# Patient Record
Sex: Female | Born: 1990 | Race: White | Hispanic: No | Marital: Married | Smoking: Former smoker
Health system: Southern US, Community
[De-identification: ages and names within clinical notes are randomized; demographics above are authoritative.]

## PROBLEM LIST (undated history)

## (undated) ENCOUNTER — Inpatient Hospital Stay (HOSPITAL_COMMUNITY): Payer: Self-pay

## (undated) DIAGNOSIS — C8190 Hodgkin lymphoma, unspecified, unspecified site: Secondary | ICD-10-CM

## (undated) DIAGNOSIS — R0600 Dyspnea, unspecified: Secondary | ICD-10-CM

## (undated) DIAGNOSIS — D709 Neutropenia, unspecified: Secondary | ICD-10-CM

## (undated) DIAGNOSIS — R5081 Fever presenting with conditions classified elsewhere: Secondary | ICD-10-CM

## (undated) DIAGNOSIS — N390 Urinary tract infection, site not specified: Secondary | ICD-10-CM

## (undated) DIAGNOSIS — F419 Anxiety disorder, unspecified: Secondary | ICD-10-CM

## (undated) DIAGNOSIS — F329 Major depressive disorder, single episode, unspecified: Secondary | ICD-10-CM

## (undated) HISTORY — PX: MOUTH SURGERY: SHX715

## (undated) HISTORY — DX: Fever presenting with conditions classified elsewhere: R50.81

## (undated) HISTORY — DX: Neutropenia, unspecified: D70.9

## (undated) HISTORY — PX: WRIST SURGERY: SHX841

## (undated) HISTORY — PX: INDUCED ABORTION: SHX677

---
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
## 2014-06-30 ENCOUNTER — Encounter (HOSPITAL_COMMUNITY): Payer: Self-pay

## 2014-06-30 ENCOUNTER — Emergency Department (HOSPITAL_COMMUNITY)
Admission: EM | Admit: 2014-06-30 | Discharge: 2014-06-30 | Disposition: A | Discharge status: Home / Self Care | Payer: 59 | Attending: Emergency Medicine | Admitting: Emergency Medicine

## 2014-06-30 DIAGNOSIS — Z79899 Other long term (current) drug therapy: Secondary | ICD-10-CM | POA: Insufficient documentation

## 2014-06-30 DIAGNOSIS — N390 Urinary tract infection, site not specified: Secondary | ICD-10-CM | POA: Insufficient documentation

## 2014-06-30 DIAGNOSIS — Z87828 Personal history of other (healed) physical injury and trauma: Secondary | ICD-10-CM | POA: Insufficient documentation

## 2014-06-30 DIAGNOSIS — Z3202 Encounter for pregnancy test, result negative: Secondary | ICD-10-CM | POA: Diagnosis not present

## 2014-06-30 DIAGNOSIS — R109 Unspecified abdominal pain: Secondary | ICD-10-CM | POA: Diagnosis present

## 2014-06-30 LAB — URINALYSIS, ROUTINE W REFLEX MICROSCOPIC
BILIRUBIN URINE: NEGATIVE
Glucose, UA: NEGATIVE mg/dL
Hgb urine dipstick: NEGATIVE
KETONES UR: NEGATIVE mg/dL
NITRITE: NEGATIVE
Protein, ur: NEGATIVE mg/dL
SPECIFIC GRAVITY, URINE: 1.023 (ref 1.005–1.030)
UROBILINOGEN UA: 0.2 mg/dL (ref 0.0–1.0)
pH: 6.5 (ref 5.0–8.0)

## 2014-06-30 LAB — COMPREHENSIVE METABOLIC PANEL
ALBUMIN: 4 g/dL (ref 3.5–5.2)
ALT: 15 U/L (ref 0–35)
ANION GAP: 14 (ref 5–15)
AST: 17 U/L (ref 0–37)
Alkaline Phosphatase: 67 U/L (ref 39–117)
BILIRUBIN TOTAL: 0.4 mg/dL (ref 0.3–1.2)
BUN: 13 mg/dL (ref 6–23)
CHLORIDE: 104 meq/L (ref 96–112)
CO2: 22 mEq/L (ref 19–32)
CREATININE: 0.72 mg/dL (ref 0.50–1.10)
Calcium: 9.6 mg/dL (ref 8.4–10.5)
GFR calc Af Amer: >90 mL/min (ref >90)
GFR calc non Af Amer: >90 mL/min (ref >90)
Glucose, Bld: 83 mg/dL (ref 70–99)
Potassium: 4.1 mEq/L (ref 3.7–5.3)
Sodium: 140 mEq/L (ref 137–147)
TOTAL PROTEIN: 7.6 g/dL (ref 6.0–8.3)

## 2014-06-30 LAB — CBC WITH DIFFERENTIAL/PLATELET
BASOS PCT: 0 % (ref 0–1)
Basophils Absolute: 0 10*3/uL (ref 0.0–0.1)
EOS ABS: 0.4 10*3/uL (ref 0.0–0.7)
EOS PCT: 4 % (ref 0–5)
HEMATOCRIT: 38.9 % (ref 36.0–46.0)
HEMOGLOBIN: 13.2 g/dL (ref 12.0–15.0)
Lymphocytes Relative: 32 % (ref 12–46)
Lymphs Abs: 3.2 10*3/uL (ref 0.7–4.0)
MCH: 29.8 pg (ref 26.0–34.0)
MCHC: 33.9 g/dL (ref 30.0–36.0)
MCV: 87.8 fL (ref 78.0–100.0)
MONO ABS: 1 10*3/uL (ref 0.1–1.0)
MONOS PCT: 10 % (ref 3–12)
Neutro Abs: 5.4 10*3/uL (ref 1.7–7.7)
Neutrophils Relative %: 54 % (ref 43–77)
Platelets: 282 10*3/uL (ref 150–400)
RBC: 4.43 MIL/uL (ref 3.87–5.11)
RDW: 12.2 % (ref 11.5–15.5)
WBC: 10 10*3/uL (ref 4.0–10.5)

## 2014-06-30 LAB — URINE MICROSCOPIC-ADD ON

## 2014-06-30 LAB — PREGNANCY, URINE: PREG TEST UR: NEGATIVE

## 2014-06-30 MED ORDER — DEXTROSE 5 % IV SOLN
1.0000 g | Freq: Once | INTRAVENOUS | Status: AC
  Administered 2014-06-30: 1 g via INTRAVENOUS
  Filled 2014-06-30: qty 10

## 2014-06-30 MED ORDER — CIPROFLOXACIN HCL 500 MG PO TABS: 500.0000 mg | ORAL_TABLET | Freq: Two times a day (BID) | ORAL | Status: DC

## 2014-06-30 MED ORDER — SODIUM CHLORIDE 0.9 % IV BOLUS (SEPSIS)
1000.0000 mL | Freq: Once | INTRAVENOUS | Status: AC
  Administered 2014-06-30: 1000 mL via INTRAVENOUS

## 2014-06-30 NOTE — Discharge Instructions (Signed)
Take cipro twice a day for a week.  Stay hydrated.   Take tylenol, motrin for pain.   Follow up with your doctor.   Return to ER if you have severe pain, fever, vomiting.

## 2014-06-30 NOTE — ED Provider Notes (Signed)
CSN: [id number]     Arrival date & time 06/30/14  8416 History  This chart was scribed for Wandra Arthurs, MD by Rayfield Citizen, ED Scribe. This patient was seen in room D33C/D33C and the patient's care was started at 1:14 AM.    Chief Complaint  Patient presents with  . Abdominal Pain   The history is provided by the patient. No language interpreter was used.     HPI Comments: Adrienne [name] is a 23 y.o. female who presents to the Emergency Department complaining of 3 days of  intermittent abdominal pain and nausea. She notes that the abdominal pain woke her from sleep tonight at 2330. She also notes "tissue" in her urine. She denies vomiting, diarrhea, dysuria, vaginal discharge.   She has one prior experience with UTI (2013); no other significant history of this issue. She denies any other relevant medical history. She does note that she is a recovering drug addict and would prefer no narcotic pain meds.   LNMP two weeks PTA.   History reviewed. No pertinent past medical history. Past Surgical History  Procedure Laterality Date  . Wrist surgery    . Mouth surgery     History reviewed. No pertinent family history. History  Substance Use Topics  . Smoking status: Never Smoker   . Smokeless tobacco: Not on file  . Alcohol Use: No   OB History    No data available     Review of Systems  Gastrointestinal: Positive for nausea and abdominal pain.  All other systems reviewed and are negative.   Allergies  Review of patient's allergies indicates no known allergies.  Home Medications   Prior to Admission medications   Medication Sig Start Date End Date Taking? Authorizing Provider  BIOTIN PO Take 1 tablet by mouth daily.   Yes Historical Provider, MD  Prenatal Vit-Fe Fumarate-FA (PRENATAL MULTIVITAMIN) TABS tablet Take 1 tablet by mouth daily at 12 noon.   Yes Historical Provider, MD   BP 98/53 mmHg  Pulse 69  Temp(Src) 97.5 F (36.4 C) (Oral)  Resp 14  Ht 5\' 9"  (1.753 m)   Wt 175 lb (79.379 kg)  BMI 25.83 kg/m2  SpO2 98%  LMP 06/09/2014 Physical Exam  Constitutional: She is oriented to person, place, and time. She appears well-developed and well-nourished.  HENT:  Head: Normocephalic and atraumatic.  Neck: No tracheal deviation present.  Cardiovascular: Normal rate.   Pulmonary/Chest: Effort normal. She has no wheezes. She has no rales.  Genitourinary:  No CVA tenderness  Neurological: She is alert and oriented to person, place, and time.  Skin: Skin is warm and dry.  Psychiatric: She has a normal mood and affect. Her behavior is normal.  Nursing note and vitals reviewed.   ED Course  Procedures   DIAGNOSTIC STUDIES: Oxygen Saturation is 100RA% on RA, normal by my interpretation.    COORDINATION OF CARE: 1:15 AM Discussed treatment plan with pt at bedside and pt agreed to plan.  Labs Review Labs Reviewed  URINALYSIS, ROUTINE W REFLEX MICROSCOPIC - Abnormal; Notable for the following:    APPearance CLOUDY (*)    Leukocytes, UA MODERATE (*)    All other components within normal limits  URINE MICROSCOPIC-ADD ON - Abnormal; Notable for the following:    Squamous Epithelial / LPF MANY (*)    Bacteria, UA MANY (*)    All other components within normal limits  URINE CULTURE  CBC WITH DIFFERENTIAL  COMPREHENSIVE METABOLIC PANEL  PREGNANCY, URINE  Imaging Review No results found.   EKG Interpretation None      MDM   Final diagnoses:  None   Adrienne [name] is a 23 y.o. female here with suprapubic pain. Likely UTI. UA + UTI. Of note, BP normal on arrival, dropped to 74/56 in the ED, likely from cuff placement. Inc to 98/53 with adjustment of the cuff. Given 1L NS, ceftriaxone. WBC nl. Not septic appearing. No vaginal discharge and not concerned for GYN pathology. Will d/c home with cipro.   I personally performed the services described in this documentation, which was scribed in my presence. The recorded information has been  reviewed and is accurate.    Wandra Arthurs, MD 06/30/14 [phone]

## 2014-06-30 NOTE — ED Notes (Signed)
Pt presents with suprapubic pain and nausea starting Friday. Pt states she feels like she is urinating "bloody tissue," pt denies abnormal vaginal odor, discharge, bleeding or burning with urination, vomiting or fevers.

## 2014-07-01 ENCOUNTER — Ambulatory Visit (INDEPENDENT_AMBULATORY_CARE_PROVIDER_SITE_OTHER): Payer: 59 | Admitting: Physician Assistant

## 2014-07-01 VITALS — BP 116/70 | HR 81 | Temp 97.9°F | Resp 16 | Ht 68.5 in | Wt 189.4 lb

## 2014-07-01 DIAGNOSIS — N390 Urinary tract infection, site not specified: Secondary | ICD-10-CM

## 2014-07-01 DIAGNOSIS — R1030 Lower abdominal pain, unspecified: Secondary | ICD-10-CM

## 2014-07-01 DIAGNOSIS — R112 Nausea with vomiting, unspecified: Secondary | ICD-10-CM

## 2014-07-01 DIAGNOSIS — R300 Dysuria: Secondary | ICD-10-CM

## 2014-07-01 LAB — POCT URINALYSIS DIPSTICK
GLUCOSE UA: 100
NITRITE UA: POSITIVE
Protein, UA: 100
Spec Grav, UA: >=1.03
UROBILINOGEN UA: 1
pH, UA: 5

## 2014-07-01 LAB — POCT WET PREP WITH KOH
CLUE CELLS WET PREP PER HPF POC: NEGATIVE
KOH PREP POC: NEGATIVE
RBC Wet Prep HPF POC: NEGATIVE
TRICHOMONAS UA: NEGATIVE
Yeast Wet Prep HPF POC: NEGATIVE

## 2014-07-01 LAB — URINE CULTURE

## 2014-07-01 LAB — POCT UA - MICROSCOPIC ONLY
CASTS, UR, LPF, POC: NEGATIVE
Crystals, Ur, HPF, POC: NEGATIVE
Mucus, UA: NEGATIVE
YEAST UA: NEGATIVE

## 2014-07-01 MED ORDER — NITROFURANTOIN MONOHYD MACRO 100 MG PO CAPS
100.0000 mg | ORAL_CAPSULE | Freq: Two times a day (BID) | ORAL | Status: DC
Start: 2014-07-01 — End: 2014-07-01

## 2014-07-01 MED ORDER — NITROFURANTOIN MONOHYD MACRO 100 MG PO CAPS
100.0000 mg | ORAL_CAPSULE | Freq: Two times a day (BID) | ORAL | Status: DC
Start: 2014-07-01 — End: 2015-04-02

## 2014-07-01 NOTE — Patient Instructions (Signed)
Stop taking cipro. Start taking new antibiotic macrobid - you will take this for 7 days. If not improving in 48 hours, call the office. Drink plenty of water - 64 oz/day.

## 2014-07-01 NOTE — Progress Notes (Signed)
Subjective:    Patient ID: Adrienne [name], female    DOB: 08-Jun-1991, 23 y.o.   MRN: [id number] There are no active problems to display for this patient.  Prior to Admission medications   Medication Sig Start Date End Date Taking? Authorizing Provider  BIOTIN PO Take 1 tablet by mouth daily.   Yes Historical Provider, MD  ciprofloxacin (CIPRO) 500 MG tablet Take 1 tablet (500 mg total) by mouth 2 (two) times daily. One po bid x 7 days 06/30/14  Yes Wandra Arthurs, MD  Prenatal Vit-Fe Fumarate-FA (PRENATAL MULTIVITAMIN) TABS tablet Take 1 tablet by mouth daily at 12 noon.   Yes Historical Provider, MD   No Known Allergies  HPI  This is a 23 year old female presenting with intermittent sharp abdominal pain, dysuria, urinary frequency and nausea x 5 days. She was seen in the ED early morning of 11/23 and was diagnosed with UTI and sent home with cipro 500 mg. CBC, CMP and urine pregnancy negative at that time. Urine culture did not have any significant growth. She reports she took 2 cipros yesterday and 2 cipros today and her symptoms are worsening. She states she vomited twice last night. She has been using AZO for urinary pain and ibuprofen for abdominal pain without relief. She notes when she urinates she is "peeing out tissue". She denies fever, chills, back pain, hematuria, or vaginal discharge.  Patient's social and family history were reviewed.   Review of Systems  Constitutional: Negative for fever and chills.  Gastrointestinal: Positive for abdominal pain. Negative for nausea, vomiting and diarrhea.  Genitourinary: Positive for dysuria and frequency. Negative for hematuria and vaginal discharge.  Musculoskeletal: Negative for back pain.  Skin: Negative for rash.      Objective:   Physical Exam  Constitutional: She is oriented to person, place, and time. She appears well-developed and well-nourished. No distress.  HENT:  Head: Normocephalic and atraumatic.  Right Ear: Hearing  normal.  Left Ear: Hearing normal.  Nose: Nose normal.  Mouth/Throat: Uvula is midline, oropharynx is clear and moist and mucous membranes are normal.  Eyes: Conjunctivae and lids are normal. Right eye exhibits no discharge. Left eye exhibits no discharge. No scleral icterus.  Cardiovascular: Normal rate, regular rhythm, normal heart sounds, intact distal pulses and normal pulses.   No murmur heard. Pulmonary/Chest: Effort normal and breath sounds normal. No respiratory distress. She has no wheezes. She has no rhonchi. She has no rales.  Abdominal: Soft. Normal appearance and bowel sounds are normal. There is tenderness in the suprapubic area. There is no rebound, no guarding and no CVA tenderness.  Genitourinary: Vagina normal and uterus normal. There is no lesion on the right labia. There is no lesion on the left labia. Cervix exhibits no motion tenderness, no discharge and no friability. Right adnexum displays no mass, no tenderness and no fullness. Left adnexum displays no mass, no tenderness and no fullness. No tenderness in the vagina. No vaginal discharge found.  Musculoskeletal: Normal range of motion.  Lymphadenopathy:       Head (right side): No submental, no submandibular, no tonsillar, no preauricular, no posterior auricular and no occipital adenopathy present.       Head (left side): No submental, no submandibular, no tonsillar, no preauricular, no posterior auricular and no occipital adenopathy present.    She has no cervical adenopathy.  Neurological: She is alert and oriented to person, place, and time.  Skin: Skin is warm, dry and intact.  No lesion and no rash noted.  Psychiatric: She has a normal mood and affect. Her speech is normal and behavior is normal. Thought content normal.   Results for orders placed or performed in visit on 07/01/14  POCT UA - Microscopic Only  Result Value Ref Range   WBC, Ur, HPF, POC tntc    RBC, urine, microscopic tntc    Bacteria, U Microscopic  4+    Mucus, UA neg    Epithelial cells, urine per micros 6-10    Crystals, Ur, HPF, POC neg    Casts, Ur, LPF, POC neg    Yeast, UA neg   POCT urinalysis dipstick  Result Value Ref Range   Color, UA orange    Clarity, UA turbid    Glucose, UA 100    Bilirubin, UA small    Ketones, UA trace    Spec Grav, UA >=1.030    Blood, UA moderate    pH, UA 5.0    Protein, UA 100    Urobilinogen, UA 1.0    Nitrite, UA positive    Leukocytes, UA large (3+)   POCT Wet Prep with KOH  Result Value Ref Range   Trichomonas, UA Negative    Clue Cells Wet Prep HPF POC neg    Epithelial Wet Prep HPF POC 4-8    Yeast Wet Prep HPF POC neg    Bacteria Wet Prep HPF POC trace    RBC Wet Prep HPF POC neg    WBC Wet Prep HPF POC 2-4    KOH Prep POC Negative        Assessment & Plan:  1. Dysuria 2. Abdominal pain 3. UTI (lower urinary tract infection) 4. Non-intractable vomiting with nausea, vomiting of unspecified type Patient likely has a UTI with bacteria resistant to cipro. Wet prep negative. UA + for UTI. Urine culture pending. She will discontinue cipro and start macrobid. She will return if no improvement in 24-48 hours.  - POCT UA - Microscopic Only - POCT urinalysis dipstick - Urine culture - POCT Wet Prep with KOH - GC/Chlamydia Probe Amp  Benjaman Pott. Drenda Freeze, [facility]  07/01/2014

## 2014-07-03 LAB — GC/CHLAMYDIA PROBE AMP
CT PROBE, AMP APTIMA: POSITIVE — AB
GC PROBE AMP APTIMA: NEGATIVE

## 2014-07-03 LAB — URINE CULTURE
Colony Count: NO GROWTH
ORGANISM ID, BACTERIA: NO GROWTH

## 2014-07-03 NOTE — Progress Notes (Signed)
The patient was discussed with me and I agree with the diagnosis and treatment plan.  

## 2014-07-04 ENCOUNTER — Telehealth: Payer: Self-pay | Admitting: Physician Assistant

## 2014-07-04 DIAGNOSIS — A749 Chlamydial infection, unspecified: Secondary | ICD-10-CM

## 2014-07-04 MED ORDER — AZITHROMYCIN 250 MG PO TABS: ORAL_TABLET | ORAL | Status: DC

## 2014-07-04 NOTE — Telephone Encounter (Signed)
Spoke with patient and told her she has chlamydia. Counseled on safe sex practices and the need to inform all sexual partners. Medication sent to pharmacy. She reports she is feeling better on macrobid for UTI but still having some abdominal pain. She will RTC in 5 days if not improved.

## 2014-08-08 DIAGNOSIS — F32A Depression, unspecified: Secondary | ICD-10-CM

## 2014-08-08 HISTORY — DX: Depression, unspecified: F32.A

## 2014-08-08 NOTE — L&D Delivery Note (Signed)
Delivery Note Patient pushed well for 40 minutes.  At 11:09 PM a viable female was delivered via Vaginal, Spontaneous Delivery (Presentation: Left Occiput Anterior).  APGAR: 9, 9; weight 8 lb 11 oz (3940 g).   Placenta status: Intact, Spontaneous.  Cord: 3 vessels with the following complications: None.  Cord pH: n/a  Anesthesia: Epidural  Episiotomy: None Lacerations: right vaginal, bilateral labial (right labial lac repaired, left labial lac was hemostatic and not repaired) Suture Repair: 2.0 3.0 vicryl rapide Est. Blood Loss (mL):  300 mL  Mom to postpartum.  Baby to Couplet care / Skin to Skin.  [place] 04/27/2015, 11:39 PM

## 2014-12-01 LAB — OB RESULTS CONSOLE ABO/RH: RH TYPE: POSITIVE

## 2014-12-01 LAB — OB RESULTS CONSOLE HEPATITIS B SURFACE ANTIGEN: Hepatitis B Surface Ag: NEGATIVE

## 2014-12-01 LAB — OB RESULTS CONSOLE HIV ANTIBODY (ROUTINE TESTING): HIV: NONREACTIVE

## 2014-12-01 LAB — OB RESULTS CONSOLE RPR: RPR: NONREACTIVE

## 2014-12-01 LAB — OB RESULTS CONSOLE RUBELLA ANTIBODY, IGM: Rubella: IMMUNE

## 2015-03-20 LAB — OB RESULTS CONSOLE GC/CHLAMYDIA
Chlamydia: NEGATIVE
GC PROBE AMP, GENITAL: NEGATIVE

## 2015-03-20 LAB — OB RESULTS CONSOLE GBS: GBS: POSITIVE

## 2015-04-02 ENCOUNTER — Inpatient Hospital Stay (HOSPITAL_COMMUNITY)
Admission: AD | Admit: 2015-04-02 | Discharge: 2015-04-02 | Disposition: A | Discharge status: Home / Self Care | Payer: Medicaid Other | Source: Ambulatory Visit | Attending: Obstetrics | Admitting: Obstetrics

## 2015-04-02 ENCOUNTER — Encounter (HOSPITAL_COMMUNITY): Payer: Self-pay | Admitting: *Deleted

## 2015-04-02 DIAGNOSIS — O133 Gestational [pregnancy-induced] hypertension without significant proteinuria, third trimester: Secondary | ICD-10-CM

## 2015-04-02 DIAGNOSIS — R030 Elevated blood-pressure reading, without diagnosis of hypertension: Secondary | ICD-10-CM | POA: Diagnosis present

## 2015-04-02 DIAGNOSIS — H538 Other visual disturbances: Secondary | ICD-10-CM | POA: Diagnosis present

## 2015-04-02 DIAGNOSIS — R51 Headache: Secondary | ICD-10-CM | POA: Diagnosis present

## 2015-04-02 DIAGNOSIS — Z3A37 37 weeks gestation of pregnancy: Secondary | ICD-10-CM | POA: Diagnosis not present

## 2015-04-02 DIAGNOSIS — O163 Unspecified maternal hypertension, third trimester: Secondary | ICD-10-CM

## 2015-04-02 LAB — URINALYSIS, ROUTINE W REFLEX MICROSCOPIC
BILIRUBIN URINE: NEGATIVE
Glucose, UA: NEGATIVE mg/dL
Hgb urine dipstick: NEGATIVE
KETONES UR: NEGATIVE mg/dL
Leukocytes, UA: NEGATIVE
NITRITE: NEGATIVE
Protein, ur: NEGATIVE mg/dL
Specific Gravity, Urine: <1.005 — ABNORMAL LOW (ref 1.005–1.030)
UROBILINOGEN UA: 0.2 mg/dL (ref 0.0–1.0)
pH: 6 (ref 5.0–8.0)

## 2015-04-02 LAB — CBC WITH DIFFERENTIAL/PLATELET
BASOS PCT: 0 % (ref 0–1)
Basophils Absolute: 0 10*3/uL (ref 0.0–0.1)
Eosinophils Absolute: 0.4 10*3/uL (ref 0.0–0.7)
Eosinophils Relative: 3 % (ref 0–5)
HEMATOCRIT: 35.3 % — AB (ref 36.0–46.0)
Hemoglobin: 11.8 g/dL — ABNORMAL LOW (ref 12.0–15.0)
Lymphocytes Relative: 14 % (ref 12–46)
Lymphs Abs: 2 10*3/uL (ref 0.7–4.0)
MCH: 29.5 pg (ref 26.0–34.0)
MCHC: 33.4 g/dL (ref 30.0–36.0)
MCV: 88.3 fL (ref 78.0–100.0)
MONO ABS: 1.2 10*3/uL — AB (ref 0.1–1.0)
MONOS PCT: 8 % (ref 3–12)
NEUTROS ABS: 10.5 10*3/uL — AB (ref 1.7–7.7)
Neutrophils Relative %: 75 % (ref 43–77)
Platelets: 257 10*3/uL (ref 150–400)
RBC: 4 MIL/uL (ref 3.87–5.11)
RDW: 12.7 % (ref 11.5–15.5)
WBC: 14 10*3/uL — ABNORMAL HIGH (ref 4.0–10.5)

## 2015-04-02 LAB — COMPREHENSIVE METABOLIC PANEL
ALBUMIN: 2.9 g/dL — AB (ref 3.5–5.0)
ALT: 19 U/L (ref 14–54)
ANION GAP: 8 (ref 5–15)
AST: 21 U/L (ref 15–41)
Alkaline Phosphatase: 130 U/L — ABNORMAL HIGH (ref 38–126)
BILIRUBIN TOTAL: 0.5 mg/dL (ref 0.3–1.2)
BUN: 10 mg/dL (ref 6–20)
CO2: 22 mmol/L (ref 22–32)
Calcium: 9.3 mg/dL (ref 8.9–10.3)
Chloride: 106 mmol/L (ref 101–111)
Creatinine, Ser: 0.52 mg/dL (ref 0.44–1.00)
GLUCOSE: 107 mg/dL — AB (ref 65–99)
POTASSIUM: 3.8 mmol/L (ref 3.5–5.1)
Sodium: 136 mmol/L (ref 135–145)
TOTAL PROTEIN: 6.6 g/dL (ref 6.5–8.1)

## 2015-04-02 LAB — PROTEIN / CREATININE RATIO, URINE: CREATININE, URINE: 48 mg/dL

## 2015-04-02 LAB — URIC ACID: URIC ACID, SERUM: 4.1 mg/dL (ref 2.3–6.6)

## 2015-04-02 LAB — LACTATE DEHYDROGENASE: LDH: 140 U/L (ref 98–192)

## 2015-04-02 NOTE — MAU Note (Signed)
Sent from [place] office for [place] eval;

## 2015-04-02 NOTE — MAU Note (Signed)
Patient was seen at Dr. Tyler Aas office blood pressure elevated, here for [facility] evaluation, has headache, blurred vision and some nausea.

## 2015-04-02 NOTE — Discharge Instructions (Signed)
Pain Relief During Labor and Delivery Everyone experiences pain differently, but labor causes severe pain for many women. The amount of pain you experience during labor and delivery depends on your pain tolerance, contraction strength, and your baby's size and position. There are many ways to prepare for and deal with the pain, including:   Taking prenatal classes to learn about labor and delivery. The more informed you are, the less anxious and afraid you may be. This can help lessen the pain.  Taking pain-relieving medicine during labor and delivery.  Learning breathing and relaxation techniques.  Taking a shower or bath.  Getting massaged.  Changing positions.  Placing an ice pack on your back. Discuss your pain control options with your health care provider during your prenatal visits.  WHAT ARE THE TWO TYPES OF PAIN-RELIEVING MEDICINES? 1. Analgesics. These are medicines that decrease pain without total loss of feeling or muscle movement. 2. Anesthetics. These are medicines that block all feeling, including pain. There can be minor side effects of both types, such as nausea, trouble concentrating, becoming sleepy, and lowering the heart rate of the baby. However, health care providers are careful to give doses that will not seriously affect the baby.  WHAT ARE THE SPECIFIC TYPES OF ANALGESICS AND ANESTHETICS? Systemic Analgesic Systemic pain medicines affect your whole body rather than focusing pain relief on the area of your body experiencing pain. This type of medicine is given either through an IV tube in your vein or by a shot (injection) into your muscle. This medicine will lessen your pain but will not stop it completely. It may also make you sleepy, but it will not make you lose consciousness.  Local Anesthetic Local anesthetic isused tonumb a small area of your body. The medicine is injected into the area of nerves that carry feeling to the vagina, vulva, or the area between  the vagina and anus (perineum).  General Anesthetic This type of medicine causes you to lose consciousness so you do not feel pain. It is usually used only in emergency situations during labor. It is given through an IV tube or face mask. Paracervical Block A paracervical block is a form of local anesthesia given during labor. Numbing medicine is injected into the right and left sides of the cervix and vagina. It helps to lessen the pain caused by contractions and stretching of the cervix. It may have to be given more than once.  Pudendal Block A pudendal block is another form of local anesthesia. It is used to relieve the pain associated with pushing or stretching of the perineum at the time of delivery. An injection is given deep through the vaginal wall into the pudendal nerve in the pelvis, numbing the perineum.  Epidural Anesthetic An epidural is an injection of numbing medicine given in the lower back and into the epidural space near your spinal cord. The epidural numbs the lower half of your body. You may be able to move your legs but will not be allowed to walk. Epidurals can be used for labor, delivery, or cesarean deliveries.  To prevent the medicine from wearing off, a small tube (catheter) may be threaded into the epidural space and taped in place to prevent it from slipping out. Medicine can then be given continuously in small doses through the tube until you deliver. Spinal Block A spinal block is similar to an epidural, but the medicine is injected into the spinal fluid, not the epidural space. A spinal block is only given  once. It starts to relieve pain quickly but lasts only 1-2 hours. Spinal blocks can also be used for cesarean deliveries.  Combined Spinal-Epidural Block Combined spinal-epidural blocks combine the benefits of both the spinal and epidural blocks. The spinal part acts quickly to relieve pain and the epidural provides continuous pain relief. Hydrotherapy Immersion in  warm water during labor may provide comfort and relaxation. It may also help to lessen pain, the use of anesthesia, and the length of labor. However, immersion in water during the delivery (water birth) may have some risk involved and studies to determine safety and risks are ongoing. If you are a healthy woman who is expecting an uncomplicated birth, talk with your health care provider to see if water birth is an option for you.  Document Released: 11/10/2008 Document Revised: 07/30/2013 Document Reviewed: 12/13/2012 [facility] Patient Information 2015 [place], [place]. This information is not intended to replace advice given to you by your health care provider. Make sure you discuss any questions you have with your health care provider.

## 2015-04-02 NOTE — MAU Provider Note (Signed)
History     CSN: [id number]  Arrival date and time: 04/02/15 1275   First Provider Initiated Contact with Patient 04/02/15 [phone]      Chief Complaint  Patient presents with  . Headache  . Blurred Vision  . Hypertension   HPI Adrienne [name] 24 y.o. G1P0 @[redacted]w[redacted]d  presents to MAU for elevated blood pressure.  She sees white spots in her visual field - none now but present when seen in clinic this am.   She sometimes has hot flashes with headache and nausea but none now.  She denies abdominal pain.  She admits to swelling of feet.  She reports good fetal movement and denies LOF, vag bleeding, dysuria.  She does not report present contractions.   OB History    Gravida Para Term Preterm AB TAB SAB Ectopic Multiple Living   1               History reviewed. No pertinent past medical history.  Past Surgical History  Procedure Laterality Date  . Wrist surgery    . Mouth surgery      Family History  Problem Relation Age of Onset  . Alcohol abuse Neg Hx   . Arthritis Neg Hx   . Asthma Neg Hx   . Birth defects Neg Hx   . Cancer Neg Hx   . COPD Neg Hx   . Depression Neg Hx   . Diabetes Neg Hx   . Drug abuse Neg Hx   . Early death Neg Hx   . Hearing loss Neg Hx   . Heart disease Neg Hx   . Hyperlipidemia Neg Hx   . Hypertension Neg Hx   . Kidney disease Neg Hx   . Learning disabilities Neg Hx   . Mental illness Neg Hx   . Mental retardation Neg Hx   . Miscarriages / Stillbirths Neg Hx   . Stroke Neg Hx   . Vision loss Neg Hx   . Varicose Veins Neg Hx     Social History  Substance Use Topics  . Smoking status: Never Smoker   . Smokeless tobacco: None  . Alcohol Use: No    Allergies: No Known Allergies  Prescriptions prior to admission  Medication Sig Dispense Refill Last Dose  . azithromycin (ZITHROMAX) 250 MG tablet Take 1000 mg (4 pills) po once. 4 tablet 0   . BIOTIN PO Take 1 tablet by mouth daily.   Taking  . nitrofurantoin, macrocrystal-monohydrate,  (MACROBID) 100 MG capsule Take 1 capsule (100 mg total) by mouth 2 (two) times daily. 14 capsule 0   . Prenatal Vit-Fe Fumarate-FA (PRENATAL MULTIVITAMIN) TABS tablet Take 1 tablet by mouth daily at 12 noon.   Taking    ROS Pertinent ROS in HPI.  All other systems are negative.   Physical Exam   Blood pressure 130/79, pulse 112, temperature 97.4 F (36.3 C), temperature source Oral, resp. rate 18, height 5\' 9"  (1.753 m), weight 235 lb 12.8 oz (106.958 kg), last menstrual period 06/09/2014.  Physical Exam  Constitutional: She is oriented to person, place, and time. She appears well-developed and well-nourished. No distress.  HENT:  Head: Normocephalic and atraumatic.  Eyes: EOM are normal.  Neck: Normal range of motion.  Cardiovascular: Normal rate.   No edema noted  Respiratory: Effort normal. No respiratory distress.  Musculoskeletal: Normal range of motion.  Neurological: She is alert and oriented to person, place, and time.  Skin: Skin is warm and dry.  Psychiatric:  She has a normal mood and affect.   Fetal Tracing: Baseline:130s Variability:mod Accelerations: 15x15s Decelerations:none Toco:none   MAU Course  Procedures  MDM PIH labs ordered to eval.   Results for orders placed or performed during the hospital encounter of 04/02/15 (from the past 24 hour(s))  Urinalysis, Routine w reflex microscopic (not at [facility])     Status: Abnormal   Collection Time: 04/02/15  9:15 AM  Result Value Ref Range   Color, Urine YELLOW YELLOW   APPearance CLEAR CLEAR   Specific Gravity, Urine <1.005 (L) 1.005 - 1.030   pH 6.0 5.0 - 8.0   Glucose, UA NEGATIVE NEGATIVE mg/dL   Hgb urine dipstick NEGATIVE NEGATIVE   Bilirubin Urine NEGATIVE NEGATIVE   Ketones, ur NEGATIVE NEGATIVE mg/dL   Protein, ur NEGATIVE NEGATIVE mg/dL   Urobilinogen, UA 0.2 0.0 - 1.0 mg/dL   Nitrite NEGATIVE NEGATIVE   Leukocytes, UA NEGATIVE NEGATIVE  Protein / creatinine ratio, urine     Status: None    Collection Time: 04/02/15  9:15 AM  Result Value Ref Range   Creatinine, Urine 48.00 mg/dL   Total Protein, Urine <6 mg/dL   Protein Creatinine Ratio        0.00 - 0.15 mg/mg[Cre]  CBC with Differential/Platelet     Status: Abnormal   Collection Time: 04/02/15  9:50 AM  Result Value Ref Range   WBC 14.0 (H) 4.0 - 10.5 K/uL   RBC 4.00 3.87 - 5.11 MIL/uL   Hemoglobin 11.8 (L) 12.0 - 15.0 g/dL   HCT 35.3 (L) 36.0 - 46.0 %   MCV 88.3 78.0 - 100.0 fL   MCH 29.5 26.0 - 34.0 pg   MCHC 33.4 30.0 - 36.0 g/dL   RDW 12.7 11.5 - 15.5 %   Platelets 257 150 - 400 K/uL   Neutrophils Relative % 75 43 - 77 %   Neutro Abs 10.5 (H) 1.7 - 7.7 K/uL   Lymphocytes Relative 14 12 - 46 %   Lymphs Abs 2.0 0.7 - 4.0 K/uL   Monocytes Relative 8 3 - 12 %   Monocytes Absolute 1.2 (H) 0.1 - 1.0 K/uL   Eosinophils Relative 3 0 - 5 %   Eosinophils Absolute 0.4 0.0 - 0.7 K/uL   Basophils Relative 0 0 - 1 %   Basophils Absolute 0.0 0.0 - 0.1 K/uL  Comprehensive metabolic panel     Status: Abnormal   Collection Time: 04/02/15  9:50 AM  Result Value Ref Range   Sodium 136 135 - 145 mmol/L   Potassium 3.8 3.5 - 5.1 mmol/L   Chloride 106 101 - 111 mmol/L   CO2 22 22 - 32 mmol/L   Glucose, Bld 107 (H) 65 - 99 mg/dL   BUN 10 6 - 20 mg/dL   Creatinine, Ser 0.52 0.44 - 1.00 mg/dL   Calcium 9.3 8.9 - 10.3 mg/dL   Total Protein 6.6 6.5 - 8.1 g/dL   Albumin 2.9 (L) 3.5 - 5.0 g/dL   AST 21 15 - 41 U/L   ALT 19 14 - 54 U/L   Alkaline Phosphatase 130 (H) 38 - 126 U/L   Total Bilirubin 0.5 0.3 - 1.2 mg/dL   GFR calc non Af Amer >60 >60 mL/min   GFR calc Af Amer >60 >60 mL/min   Anion gap 8 5 - 15  Uric acid     Status: None   Collection Time: 04/02/15  9:50 AM  Result Value Ref Range   Uric  Acid, Serum 4.1 2.3 - 6.6 mg/dL  Lactate dehydrogenase     Status: None   Collection Time: 04/02/15  9:50 AM  Result Value Ref Range   LDH 140 98 - 192 U/L   Discussed with Dr. Carlis Abbott whom is in agreement to discharge pt to  home.  If HA - treat.  If no resolution - call MD.  Keep appt for 8/30.  Assessment and Plan  A:  1. Elevated blood pressure affecting pregnancy in third trimester, antepartum    P: Discharge to home If HA - treat with Tylenol (OTC).  If no resolution - call office/MD.   Keep appt for [facility] 8/30.  Labor precautions discussed Patient may return to MAU as needed or if her condition were to change or worsen   Paticia Stack 04/02/2015, 9:32 AM

## 2015-04-18 ENCOUNTER — Encounter (HOSPITAL_COMMUNITY): Payer: Self-pay | Admitting: *Deleted

## 2015-04-18 ENCOUNTER — Inpatient Hospital Stay (HOSPITAL_COMMUNITY)
Admission: AD | Admit: 2015-04-18 | Discharge: 2015-04-18 | Disposition: A | Discharge status: Home / Self Care | Payer: Medicaid Other | Source: Ambulatory Visit | Attending: Obstetrics & Gynecology | Admitting: Obstetrics & Gynecology

## 2015-04-18 DIAGNOSIS — Z87891 Personal history of nicotine dependence: Secondary | ICD-10-CM | POA: Diagnosis not present

## 2015-04-18 DIAGNOSIS — J029 Acute pharyngitis, unspecified: Secondary | ICD-10-CM | POA: Diagnosis present

## 2015-04-18 DIAGNOSIS — J069 Acute upper respiratory infection, unspecified: Secondary | ICD-10-CM | POA: Diagnosis not present

## 2015-04-18 DIAGNOSIS — O9989 Other specified diseases and conditions complicating pregnancy, childbirth and the puerperium: Secondary | ICD-10-CM | POA: Diagnosis not present

## 2015-04-18 DIAGNOSIS — O26893 Other specified pregnancy related conditions, third trimester: Secondary | ICD-10-CM | POA: Diagnosis not present

## 2015-04-18 DIAGNOSIS — Z3A39 39 weeks gestation of pregnancy: Secondary | ICD-10-CM | POA: Insufficient documentation

## 2015-04-18 LAB — RAPID STREP SCREEN (MED CTR MEBANE ONLY): Streptococcus, Group A Screen (Direct): NEGATIVE

## 2015-04-18 MED ORDER — LORATADINE 10 MG PO TABS
10.0000 mg | ORAL_TABLET | Freq: Every day | ORAL | Status: DC
  Filled 2015-04-18: qty 1

## 2015-04-18 MED ORDER — ACETAMINOPHEN 500 MG PO TABS
1000.0000 mg | ORAL_TABLET | Freq: Once | ORAL | Status: AC
  Administered 2015-04-18: 1000 mg via ORAL
  Filled 2015-04-18: qty 2

## 2015-04-18 NOTE — MAU Note (Signed)
Sore throat started yesterday, some nasal congestion. No cough, unser of temperature.  Husband has had a cold.  Call dr 3 times last night, unsure what she could take, unable to sleep.  Also having some sharp pains in back and pain in lower abd/pelvis. ? Leaking for over a week, was checked in office, not ruptured

## 2015-04-18 NOTE — MAU Provider Note (Signed)
History     CSN: [id number]  Arrival date and time: 04/18/15 7829   None     Chief Complaint  Patient presents with  . Sore Throat   HPI   [name] is a 24 y.o. female G2P0010 at [redacted]w[redacted]d presenting to MAU with sore throat and congestion. Symptoms started 24-48 hours ago. She has not taken anything over the counter for the symptoms. She is here because she wants a list of medications she can take safely in pregnancy.   She denies fever.     OB History    Gravida Para Term Preterm AB TAB SAB Ectopic Multiple Living   2    1 1           Past Medical History  Diagnosis Date  . Bronchitis     gets it every year  . Infection     UTI  . Vaginal Pap smear, abnormal     durin first trimester    Past Surgical History  Procedure Laterality Date  . Wrist surgery    . Mouth surgery    . Induced abortion      Family History  Problem Relation Age of Onset  . Alcohol abuse Neg Hx   . Arthritis Neg Hx   . Asthma Neg Hx   . Birth defects Neg Hx   . COPD Neg Hx   . Depression Neg Hx   . Diabetes Neg Hx   . Drug abuse Neg Hx   . Early death Neg Hx   . Hearing loss Neg Hx   . Hyperlipidemia Neg Hx   . Hypertension Neg Hx   . Kidney disease Neg Hx   . Learning disabilities Neg Hx   . Mental illness Neg Hx   . Mental retardation Neg Hx   . Miscarriages / Stillbirths Neg Hx   . Stroke Neg Hx   . Vision loss Neg Hx   . Varicose Veins Neg Hx   . Cancer Paternal Grandmother     lung  . Heart disease Paternal Grandfather     died from heart attack    Social History  Substance Use Topics  . Smoking status: Former Research scientist (life sciences)  . Smokeless tobacco: Never Used     Comment: 2015  . Alcohol Use: No    Allergies: No Known Allergies  Prescriptions prior to admission  Medication Sig Dispense Refill Last Dose  . Prenatal Vit-Fe Fumarate-FA (PRENATAL MULTIVITAMIN) TABS tablet Take 1 tablet by mouth daily at 12 noon.   04/17/2015 at Unknown time   Results for orders  placed or performed during the hospital encounter of 04/18/15 (from the past 48 hour(s))  Rapid strep screen     Status: None   Collection Time: 04/18/15  8:35 AM  Result Value Ref Range   Streptococcus, Group A Screen (Direct) NEGATIVE NEGATIVE    Comment: (NOTE) A Rapid Antigen test may result negative if the antigen level in the sample is below the detection level of this test. The FDA has not cleared this test as a stand-alone test therefore the rapid antigen negative result has reflexed to a Group A Strep culture. Performed at [facility]     Review of Systems  Constitutional: Negative for fever and chills.  HENT: Positive for sore throat. Negative for ear pain.   Respiratory: Negative for cough.   Neurological: Negative for headaches.   Physical Exam   Blood pressure 117/77, pulse 96, temperature 98 F (36.7 C), temperature source  Oral, resp. rate 18, weight 109.408 kg (241 lb 3.2 oz), last menstrual period 06/09/2014, SpO2 99 %.  Physical Exam  Constitutional: She is oriented to person, place, and time. She appears well-developed and well-nourished.  Non-toxic appearance. She does not have a sickly appearance. She does not appear ill. No distress.  HENT:  Head: Normocephalic.  Eyes: Pupils are equal, round, and reactive to light.  Musculoskeletal: Normal range of motion.  Neurological: She is alert and oriented to person, place, and time.  Skin: Skin is warm. She is not diaphoretic.  Psychiatric: Her behavior is normal.    Fetal Tracing: Baseline: 125 bpm  Variability: moderate  Accelerations: 15X15 Decelerations: none Toco: Quiet    MAU Course  Procedures  None  MDM  Strep swab pending Tylenol 1 gram PO Claritin 10 mg given PO   Discussed HPI, labs with Dr. Alwyn Pea.   Assessment and Plan    A:  1. Sore throat   2. Upper respiratory virus    D:  Discharge home in stable condition Strep swab negative A list of medications to take in  pregnancy for URI  Return to MAU if symptoms worsen   Lezlie Lye, NP 04/18/2015 5:43 PM

## 2015-04-18 NOTE — Discharge Instructions (Signed)
Cool Mist Vaporizers °Vaporizers may help relieve the symptoms of a cough and cold. They add moisture to the air, which helps mucus to become thinner and less sticky. This makes it easier to breathe and cough up secretions. Cool mist vaporizers do not cause serious burns like hot mist vaporizers, which may also be called steamers or humidifiers. Vaporizers have not been proven to help with colds. You should not use a vaporizer if you are allergic to mold. °HOME CARE INSTRUCTIONS °· Follow the package instructions for the vaporizer. °· Do not use anything other than distilled water in the vaporizer. °· Do not run the vaporizer all of the time. This can cause mold or bacteria to grow in the vaporizer. °· Clean the vaporizer after each time it is used. °· Clean and dry the vaporizer well before storing it. °· Stop using the vaporizer if worsening respiratory symptoms develop. °Document Released: 04/21/2004 Document Revised: 07/30/2013 Document Reviewed: 12/12/2012 °ExitCare® Patient Information ©2015 [employer]. This information is not intended to replace advice given to you by your health care provider. Make sure you discuss any questions you have with your health care provider. ° ° °Safe Medications in Pregnancy  ° °Acne:  °Benzoyl Peroxide  °Salicylic Acid  ° °Backache/Headache:  °Tylenol: 2 regular strength every 4 hours OR  °             2 Extra strength every 6 hours  ° °Colds/Coughs/Allergies:  °Benadryl (alcohol free) 25 mg every 6 hours as needed  °Breath right strips  °Claritin  °Cepacol throat lozenges  °Chloraseptic throat spray  °Cold-Eeze- up to three times per day  °Cough drops, alcohol free  °Flonase (by prescription only)  °Guaifenesin  °Mucinex  °Robitussin DM (plain only, alcohol free)  °Saline nasal spray/drops  °Sudafed (pseudoephedrine) & Actifed * use only after [redacted] weeks gestation and if you do not have high blood pressure  °Tylenol  °Vicks Vaporub  °Zinc lozenges  °Zyrtec  ° °Constipation:    °Colace  °Ducolax suppositories  °Fleet enema  °Glycerin suppositories  °Metamucil  °Milk of magnesia  °Miralax  °Senokot  °Smooth move tea  ° °Diarrhea:  °Kaopectate  °Imodium A-D  ° °*NO pepto Bismol  ° °Hemorrhoids:  °Anusol  °Anusol HC  °Preparation H  °Tucks  ° °Indigestion:  °Tums  °Maalox  °Mylanta  °Zantac  °Pepcid  ° °Insomnia:  °Benadryl (alcohol free) 25mg every 6 hours as needed  °Tylenol PM  °Unisom, no Gelcaps  ° °Leg Cramps:  °Tums  °MagGel  ° °Nausea/Vomiting:  °Bonine  °Dramamine  °Emetrol  °Ginger extract  °Sea bands  °Meclizine  °Nausea medication to take during pregnancy:  °Unisom (doxylamine succinate 25 mg tablets) Take one tablet daily at bedtime. If symptoms are not adequately controlled, the dose can be increased to a maximum recommended dose of two tablets daily (1/2 tablet in the morning, 1/2 tablet mid-afternoon and one at bedtime).  °Vitamin B6 100mg tablets. Take one tablet twice a day (up to 200 mg per day).  ° °Skin Rashes:  °Aveeno products  °Benadryl cream or 25mg every 6 hours as needed  °Calamine Lotion  °1% cortisone cream  ° °Yeast infection:  °Gyne-lotrimin 7  °Monistat 7  ° ° °**If taking multiple medications, please check labels to avoid duplicating the same active ingredients  °**take medication as directed on the label  °** Do not exceed 4000 mg of tylenol in 24 hours  °**Do not take medications that contain aspirin or ibuprofen  °    °   ° ° °

## 2015-04-20 LAB — CULTURE, GROUP A STREP: Strep A Culture: NEGATIVE

## 2015-04-27 ENCOUNTER — Inpatient Hospital Stay (HOSPITAL_COMMUNITY): Payer: Medicaid Other | Admitting: Anesthesiology

## 2015-04-27 ENCOUNTER — Encounter (HOSPITAL_COMMUNITY): Payer: Self-pay | Admitting: Obstetrics

## 2015-04-27 ENCOUNTER — Inpatient Hospital Stay (HOSPITAL_COMMUNITY)
Admission: AD | Admit: 2015-04-27 | Discharge: 2015-04-29 | DRG: 774 | Disposition: A | Discharge status: Home / Self Care | Payer: Medicaid Other | Source: Ambulatory Visit | Attending: Obstetrics | Admitting: Obstetrics

## 2015-04-27 DIAGNOSIS — B9789 Other viral agents as the cause of diseases classified elsewhere: Secondary | ICD-10-CM | POA: Diagnosis present

## 2015-04-27 DIAGNOSIS — O480 Post-term pregnancy: Secondary | ICD-10-CM | POA: Diagnosis present

## 2015-04-27 DIAGNOSIS — O99824 Streptococcus B carrier state complicating childbirth: Secondary | ICD-10-CM | POA: Diagnosis present

## 2015-04-27 DIAGNOSIS — O9882 Other maternal infectious and parasitic diseases complicating childbirth: Secondary | ICD-10-CM | POA: Diagnosis present

## 2015-04-27 DIAGNOSIS — O368130 Decreased fetal movements, third trimester, not applicable or unspecified: Secondary | ICD-10-CM | POA: Diagnosis present

## 2015-04-27 DIAGNOSIS — Z3A40 40 weeks gestation of pregnancy: Secondary | ICD-10-CM | POA: Diagnosis present

## 2015-04-27 DIAGNOSIS — Z87891 Personal history of nicotine dependence: Secondary | ICD-10-CM | POA: Diagnosis not present

## 2015-04-27 DIAGNOSIS — O368190 Decreased fetal movements, unspecified trimester, not applicable or unspecified: Secondary | ICD-10-CM | POA: Diagnosis present

## 2015-04-27 HISTORY — DX: Urinary tract infection, site not specified: N39.0

## 2015-04-27 LAB — CBC
HEMATOCRIT: 36.4 % (ref 36.0–46.0)
HEMOGLOBIN: 12.1 g/dL (ref 12.0–15.0)
MCH: 28.8 pg (ref 26.0–34.0)
MCHC: 33.2 g/dL (ref 30.0–36.0)
MCV: 86.7 fL (ref 78.0–100.0)
Platelets: 287 10*3/uL (ref 150–400)
RBC: 4.2 MIL/uL (ref 3.87–5.11)
RDW: 12.8 % (ref 11.5–15.5)
WBC: 13.2 10*3/uL — ABNORMAL HIGH (ref 4.0–10.5)

## 2015-04-27 LAB — TYPE AND SCREEN
ABO/RH(D): O POS
ANTIBODY SCREEN: NEGATIVE

## 2015-04-27 LAB — ABO/RH: ABO/RH(D): O POS

## 2015-04-27 MED ORDER — DIPHENHYDRAMINE HCL 50 MG/ML IJ SOLN: 12.5000 mg | INTRAMUSCULAR | Status: DC | PRN

## 2015-04-27 MED ORDER — LIDOCAINE HCL (PF) 1 % IJ SOLN
30.0000 mL | INTRAMUSCULAR | Status: DC | PRN
  Filled 2015-04-27: qty 30

## 2015-04-27 MED ORDER — CITRIC ACID-SODIUM CITRATE 334-500 MG/5ML PO SOLN: 30.0000 mL | ORAL | Status: DC | PRN

## 2015-04-27 MED ORDER — OXYTOCIN BOLUS FROM INFUSION
500.0000 mL | INTRAVENOUS | Status: DC
  Administered 2015-04-27: 500 mL via INTRAVENOUS

## 2015-04-27 MED ORDER — OXYTOCIN 40 UNITS IN LACTATED RINGERS INFUSION - SIMPLE MED
1.0000 m[IU]/min | INTRAVENOUS | Status: DC
  Administered 2015-04-27: 2 m[IU]/min via INTRAVENOUS
  Filled 2015-04-27: qty 1000

## 2015-04-27 MED ORDER — PENICILLIN G POTASSIUM 5000000 UNITS IJ SOLR
5.0000 10*6.[IU] | Freq: Once | INTRAVENOUS | Status: AC
  Administered 2015-04-27: 5 10*6.[IU] via INTRAVENOUS
  Filled 2015-04-27: qty 5

## 2015-04-27 MED ORDER — PHENYLEPHRINE 40 MCG/ML (10ML) SYRINGE FOR IV PUSH (FOR BLOOD PRESSURE SUPPORT)
80.0000 ug | PREFILLED_SYRINGE | INTRAVENOUS | Status: DC | PRN
  Filled 2015-04-27: qty 20
  Filled 2015-04-27: qty 2

## 2015-04-27 MED ORDER — LIDOCAINE HCL (PF) 1 % IJ SOLN
INTRAMUSCULAR | Status: DC | PRN
  Administered 2015-04-27 (×2): 4 mL

## 2015-04-27 MED ORDER — TERBUTALINE SULFATE 1 MG/ML IJ SOLN
0.2500 mg | Freq: Once | INTRAMUSCULAR | Status: DC | PRN
  Filled 2015-04-27: qty 1

## 2015-04-27 MED ORDER — EPHEDRINE 5 MG/ML INJ
10.0000 mg | INTRAVENOUS | Status: DC | PRN
  Filled 2015-04-27: qty 2

## 2015-04-27 MED ORDER — LACTATED RINGERS IV SOLN
INTRAVENOUS | Status: DC
  Administered 2015-04-27 (×2): via INTRAVENOUS

## 2015-04-27 MED ORDER — DEXTROSE 5 % IV SOLN
2.5000 10*6.[IU] | INTRAVENOUS | Status: DC
  Administered 2015-04-27 (×2): 2.5 10*6.[IU] via INTRAVENOUS
  Filled 2015-04-27 (×7): qty 2.5

## 2015-04-27 MED ORDER — ONDANSETRON HCL 4 MG/2ML IJ SOLN
4.0000 mg | Freq: Four times a day (QID) | INTRAMUSCULAR | Status: DC | PRN
  Administered 2015-04-27: 4 mg via INTRAVENOUS
  Filled 2015-04-27: qty 2

## 2015-04-27 MED ORDER — LACTATED RINGERS IV SOLN
500.0000 mL | INTRAVENOUS | Status: DC | PRN
  Administered 2015-04-27 (×2): 500 mL via INTRAVENOUS

## 2015-04-27 MED ORDER — FENTANYL 2.5 MCG/ML BUPIVACAINE 1/10 % EPIDURAL INFUSION (WH - ANES)
14.0000 mL/h | INTRAMUSCULAR | Status: DC | PRN
  Administered 2015-04-27 (×2): 14 mL/h via EPIDURAL
  Filled 2015-04-27: qty 125

## 2015-04-27 MED ORDER — OXYTOCIN 40 UNITS IN LACTATED RINGERS INFUSION - SIMPLE MED: 62.5000 mL/h | INTRAVENOUS | Status: DC

## 2015-04-27 MED ORDER — BUTORPHANOL TARTRATE 1 MG/ML IJ SOLN
1.0000 mg | INTRAMUSCULAR | Status: DC | PRN
  Administered 2015-04-27: 1 mg via INTRAVENOUS
  Filled 2015-04-27: qty 1

## 2015-04-27 MED ORDER — ACETAMINOPHEN 325 MG PO TABS: 650.0000 mg | ORAL_TABLET | ORAL | Status: DC | PRN

## 2015-04-27 NOTE — Anesthesia Procedure Notes (Signed)
Epidural Patient location during procedure: OB  Staffing Anesthesiologist: JUDD, MARY Performed by: anesthesiologist   Preanesthetic Checklist Completed: patient identified, site marked, surgical consent, pre-op evaluation, timeout performed, IV checked, risks and benefits discussed and monitors and equipment checked  Epidural Patient position: sitting Prep: site prepped and draped and DuraPrep Patient monitoring: continuous pulse ox and blood pressure Approach: midline Location: L3-L4 Injection technique: LOR saline  Needle:  Needle type: Tuohy  Needle gauge: 17 G Needle length: 9 cm and 9 Needle insertion depth: 7 cm Catheter type: closed end flexible Catheter size: 19 Gauge Catheter at skin depth: 12 cm Test dose: negative  Assessment Events: blood not aspirated, injection not painful, no injection resistance, negative IV test and no paresthesia  Additional Notes Patient identified. Risks/Benefits/Options discussed with patient including but not limited to bleeding, infection, nerve damage, paralysis, failed block, incomplete pain control, headache, blood pressure changes, nausea, vomiting, reactions to medication both or allergic, itching and postpartum back pain. Confirmed with bedside nurse the patient's most recent platelet count. Confirmed with patient that they are not currently taking any anticoagulation, have any bleeding history or any family history of bleeding disorders. Patient expressed understanding and wished to proceed. All questions were answered. Sterile technique was used throughout the entire procedure. Please see nursing notes for vital signs. Test dose was given through epidural catheter and negative prior to continuing to dose epidural or start infusion. Warning signs of high block given to the patient including shortness of breath, tingling/numbness in hands, complete motor block, or any concerning symptoms with instructions to call for help. Patient was  given instructions on fall risk and not to get out of bed. All questions and concerns addressed with instructions to call with any issues or inadequate analgesia.      

## 2015-04-27 NOTE — Progress Notes (Signed)
Not feeling ctx  Toco q2-5 min EFM: 130s, mod var, + accels, neg decels SVD: 3-4/70/-1, AROM thick med  G1 @ [redacted]w[redacted]d, IOL decreased FM at term Cont pitocin Gbs positive on pcn FSR

## 2015-04-27 NOTE — Anesthesia Preprocedure Evaluation (Signed)
Anesthesia Evaluation  Patient identified by MRN, date of birth, ID band Patient awake    Reviewed: Allergy & Precautions, NPO status , Patient's Chart, lab work & pertinent test results  History of Anesthesia Complications Negative for: history of anesthetic complications  Airway Mallampati: II  TM Distance: >3 FB Neck ROM: Full    Dental no notable dental hx. (+) Dental Advisory Given   Pulmonary former smoker,    Pulmonary exam normal breath sounds clear to auscultation       Cardiovascular negative cardio ROS Normal cardiovascular exam Rhythm:Regular Rate:Normal     Neuro/Psych negative neurological ROS  negative psych ROS   GI/Hepatic negative GI ROS, Neg liver ROS,   Endo/Other  negative endocrine ROS  Renal/GU negative Renal ROS  negative genitourinary   Musculoskeletal negative musculoskeletal ROS (+)   Abdominal   Peds negative pediatric ROS (+)  Hematology negative hematology ROS (+)   Anesthesia Other Findings   Reproductive/Obstetrics (+) Pregnancy                             Anesthesia Physical Anesthesia Plan  ASA: II  Anesthesia Plan: Epidural   Post-op Pain Management:    Induction:   Airway Management Planned:   Additional Equipment:   Intra-op Plan:   Post-operative Plan:   Informed Consent:   Plan Discussed with: CRNA  Anesthesia Plan Comments:         Anesthesia Quick Evaluation

## 2015-04-27 NOTE — H&P (Signed)
24 y.o. G2P0010 @ [redacted]w[redacted]d presents for IOL due to decreased fetal movement at term.  C/o minimal fetal movement over last 2 days.  + irregular acontractions.  Otherwise has good fetal movement and no bleeding.  Past Medical History  Diagnosis Date  . Frequent UTI     Past Surgical History  Procedure Laterality Date  . Wrist surgery    . Mouth surgery    . Induced abortion      OB History  Gravida Para Term Preterm AB SAB TAB Ectopic Multiple Living  2    1  1        # Outcome Date GA Lbr Len/2nd Weight Sex Delivery Anes PTL Lv  2 Current           1 TAB               Social History   Social History  . Marital Status: Single    Spouse Name: N/A  . Number of Children: N/A  . Years of Education: N/A   Occupational History  . Not on file.   Social History Main Topics  . Smoking status: Former Research scientist (life sciences)  . Smokeless tobacco: Never Used     Comment: 2015  . Alcohol Use: No  . Drug Use: No  . Sexual Activity: Yes    Birth Control/ Protection: None   Other Topics Concern  . Not on file   Social History Narrative   Review of patient's allergies indicates no known allergies.    Prenatal Transfer Tool  Maternal Diabetes: No Genetic Screening: Declined Maternal Ultrasounds/Referrals: Normal Fetal Ultrasounds or other Referrals:  None Maternal Substance Abuse:  No Significant Maternal Medications:  None Significant Maternal Lab Results: Lab values include: Group B Strep positive  ABO, Rh:  O+ Antibody:  antibody screen neg Rubella:  Immune    Other PNC: uncomplicated.    Filed Vitals:   04/27/15 1238  BP: 119/89  Pulse: 88  Temp:   Resp: 18     General:  NAD Lungs: CTAB Cardiac: RRR Abdomen:  soft, gravid, EFW 8.5# Ex:  1+ edema SVE:  3-4/70/-2/soft/anterior FHTs:  130s, mod var, + accels, no decels Toco:  quiet   A/P   24 y.o. G2P0010 [redacted]w[redacted]d presents with IOL for decreased fetal movement at term Cervix favorable, start pitocin Epidural upon maternal  request FSR/ vtx/ GBS positive--PCN  [place]

## 2015-04-28 ENCOUNTER — Encounter (HOSPITAL_COMMUNITY): Payer: Self-pay

## 2015-04-28 LAB — CBC
HCT: 32.4 % — ABNORMAL LOW (ref 36.0–46.0)
Hemoglobin: 10.7 g/dL — ABNORMAL LOW (ref 12.0–15.0)
MCH: 28.8 pg (ref 26.0–34.0)
MCHC: 33 g/dL (ref 30.0–36.0)
MCV: 87.1 fL (ref 78.0–100.0)
PLATELETS: 261 10*3/uL (ref 150–400)
RBC: 3.72 MIL/uL — AB (ref 3.87–5.11)
RDW: 12.9 % (ref 11.5–15.5)
WBC: 20.5 10*3/uL — ABNORMAL HIGH (ref 4.0–10.5)

## 2015-04-28 LAB — RPR: RPR Ser Ql: NONREACTIVE

## 2015-04-28 MED ORDER — IBUPROFEN 600 MG PO TABS
600.0000 mg | ORAL_TABLET | Freq: Four times a day (QID) | ORAL | Status: DC
  Administered 2015-04-28 – 2015-04-29 (×5): 600 mg via ORAL
  Filled 2015-04-28 (×6): qty 1

## 2015-04-28 MED ORDER — LANOLIN HYDROUS EX OINT
TOPICAL_OINTMENT | CUTANEOUS | Status: DC | PRN
Start: 2015-04-28 — End: 2015-04-29

## 2015-04-28 MED ORDER — INFLUENZA VAC SPLIT QUAD 0.5 ML IM SUSY
0.5000 mL | PREFILLED_SYRINGE | INTRAMUSCULAR | Status: AC
  Administered 2015-04-29: 0.5 mL via INTRAMUSCULAR
  Filled 2015-04-28: qty 0.5

## 2015-04-28 MED ORDER — DIBUCAINE 1 % RE OINT: 1.0000 "application " | TOPICAL_OINTMENT | RECTAL | Status: DC | PRN

## 2015-04-28 MED ORDER — OXYCODONE-ACETAMINOPHEN 5-325 MG PO TABS: 2.0000 | ORAL_TABLET | ORAL | Status: DC | PRN

## 2015-04-28 MED ORDER — ONDANSETRON HCL 4 MG PO TABS: 4.0000 mg | ORAL_TABLET | ORAL | Status: DC | PRN

## 2015-04-28 MED ORDER — ONDANSETRON HCL 4 MG/2ML IJ SOLN: 4.0000 mg | INTRAMUSCULAR | Status: DC | PRN

## 2015-04-28 MED ORDER — OXYCODONE-ACETAMINOPHEN 5-325 MG PO TABS: 1.0000 | ORAL_TABLET | ORAL | Status: DC | PRN

## 2015-04-28 MED ORDER — SIMETHICONE 80 MG PO CHEW: 80.0000 mg | CHEWABLE_TABLET | ORAL | Status: DC | PRN

## 2015-04-28 MED ORDER — BENZOCAINE-MENTHOL 20-0.5 % EX AERO
1.0000 "application " | INHALATION_SPRAY | CUTANEOUS | Status: DC | PRN
  Administered 2015-04-28: 1 via TOPICAL
  Filled 2015-04-28: qty 56

## 2015-04-28 MED ORDER — DIPHENHYDRAMINE HCL 25 MG PO CAPS: 25.0000 mg | ORAL_CAPSULE | Freq: Four times a day (QID) | ORAL | Status: DC | PRN

## 2015-04-28 MED ORDER — PRENATAL MULTIVITAMIN CH
1.0000 | ORAL_TABLET | Freq: Every day | ORAL | Status: DC
  Administered 2015-04-28: 1 via ORAL
  Filled 2015-04-28: qty 1

## 2015-04-28 MED ORDER — SENNOSIDES-DOCUSATE SODIUM 8.6-50 MG PO TABS
2.0000 | ORAL_TABLET | ORAL | Status: DC
  Administered 2015-04-29: 2 via ORAL
  Filled 2015-04-28: qty 2

## 2015-04-28 MED ORDER — ACETAMINOPHEN 325 MG PO TABS: 650.0000 mg | ORAL_TABLET | ORAL | Status: DC | PRN

## 2015-04-28 MED ORDER — WITCH HAZEL-GLYCERIN EX PADS: 1.0000 "application " | MEDICATED_PAD | CUTANEOUS | Status: DC | PRN

## 2015-04-28 NOTE — Plan of Care (Signed)
Problem: Phase I Progression Outcomes Goal: OOB as tolerated unless otherwise ordered Outcome: Not Progressing Legs- weak

## 2015-04-28 NOTE — Progress Notes (Signed)
Patient is eating, ambulating, voiding.  Pain control is good.  Filed Vitals:   04/28/15 0115 04/28/15 0200 04/28/15 0319 04/28/15 0625  BP: 114/62 109/52 110/59 121/61  Pulse: 61 61 72 61  Temp:  98.6 F (37 C) 98.2 F (36.8 C) 97.9 F (36.6 C)  TempSrc:  Oral Oral Oral  Resp:  18 18 20   Height:      Weight:      SpO2:        Fundus firm Perineum without swelling.  Lab Results  Component Value Date   WBC 20.5* 04/28/2015   HGB 10.7* 04/28/2015   HCT 32.4* 04/28/2015   MCV 87.1 04/28/2015   PLT 261 04/28/2015    --/--/O POS, O POS (09/19 1220)/RI  A/P Post partum day 1.  Routine care.  Expect d/c routine.    HORVATH,MICHELLE A

## 2015-04-28 NOTE — Lactation Note (Deleted)
This note was copied from the chart of Adrienne Akshara Blumenthal. Lactation Consultation Note  Patient Name: Adrienne [name] IOMBT'D Date: 04/28/2015 Reason for consult: Initial assessment  Baby is 9 hours old , has been to the breast prior to consult. Baby awake , had a large wet , assisted with latch , depth obtained , multiply swallows noted Mom comfort able and baby fed 15 mins , and fell asleep. Reviewed hand expressing , mom returned demo. Mother informed of post-discharge support and given phone number to the lactation department,  including services for phone call assistance; out-patient appointments; and breastfeeding support group.  List of other breastfeeding resources in the community given in the handout. Encouraged mother to call for  problems or concerns related to breastfeeding.   Maternal Data Has patient been taught Hand Expression?: Yes Does the patient have breastfeeding experience prior to this delivery?: No  Feeding Feeding Type:  (baby already latched , multiply swallows noted ) Length of feed: 15 min  LATCH Score/Interventions Latch: Grasps breast easily, tongue down, lips flanged, rhythmical sucking. Intervention(s): Skin to skin;Teach feeding cues;Waking techniques Intervention(s): Adjust position;Assist with latch;Breast massage;Breast compression  Audible Swallowing: Spontaneous and intermittent  Type of Nipple: Everted at rest and after stimulation  Comfort (Breast/Nipple): Soft / non-tender     Hold (Positioning): Assistance needed to correctly position infant at breast and maintain latch. Intervention(s): Breastfeeding basics reviewed;Support Pillows;Position options;Skin to skin  LATCH Score: 9  Lactation Tools Discussed/Used WIC Program: Yes   Consult Status Consult Status: Follow-up Date: 04/29/15 Follow-up type: In-patient    Myer Haff 04/28/2015, 4:05 PM

## 2015-04-28 NOTE — Discharge Summary (Signed)
Obstetric Discharge Summary Reason for Admission: induction of labor Prenatal Procedures: none Intrapartum Procedures: spontaneous vaginal delivery Postpartum Procedures: none Complications-Operative and Postpartum: 2 degree perineal laceration HEMOGLOBIN  Date Value Ref Range Status  04/28/2015 10.7* 12.0 - 15.0 g/dL Final   HCT  Date Value Ref Range Status  04/28/2015 32.4* 36.0 - 46.0 % Final    Discharge Diagnoses: Term Pregnancy-delivered  Discharge Information: Date: 04/28/2015 Activity: pelvic rest Diet: routine Medications: Ibuprofen Condition: stable Instructions: refer to practice specific booklet Discharge to: home Follow-up Information    Follow up with [facility] Carlis Abbott, MD In 4 weeks.   Specialty:  Obstetrics   Contact information:   [address] [place] [address] [phone]       Newborn Data: Live born female  Birth Weight: 8 lb 11 oz (3941 g) APGAR: 9, 9  Home with mother.  HORVATH,MICHELLE A 04/28/2015, 7:44 AM

## 2015-04-28 NOTE — Anesthesia Postprocedure Evaluation (Signed)
  Anesthesia Post-op Note  Patient: Adrienne [name]  Procedure(s) Performed: * No procedures listed *  Patient Location: Mother/Baby  Anesthesia Type:Epidural  Level of Consciousness: awake, alert , oriented and patient cooperative  Airway and Oxygen Therapy: Patient Spontanous Breathing  Post-op Pain: none  Post-op Assessment: Post-op Vital signs reviewed, Patient's Cardiovascular Status Stable, Respiratory Function Stable, Patent Airway, No headache, No backache and Patient able to bend at knees              Post-op Vital Signs: Reviewed and stable  Last Vitals:  Filed Vitals:   04/28/15 0625  BP: 121/61  Pulse: 61  Temp: 36.6 C  Resp: 20    Complications: No apparent anesthesia complications

## 2015-04-28 NOTE — Progress Notes (Signed)
UR chart review completed.  

## 2015-04-29 MED ORDER — IBUPROFEN 600 MG PO TABS: 600.0000 mg | ORAL_TABLET | Freq: Four times a day (QID) | ORAL | Status: DC

## 2015-04-29 NOTE — Discharge Instructions (Signed)

## 2015-04-29 NOTE — Lactation Note (Signed)
This note was copied from the chart of Adrienne Witney Huie. Lactation Consultation Note  Patient Name: Adrienne [name] [id number] Date: 04/29/2015 Reason for consult: Follow-up assessment  Baby is 85 hours old and was exclusively breast until the baby was cluster feeding  And parents asked for formula. Small volumes after baby has been to the breast.  LC reviewed Supply and demand and the importance of consistently latching the baby at the breast 1st ,  Offering both breast and if the baby is satisfied , hold off on supplementing. If the baby isn't satisfied , relatch  The baby or keep supplementing low. Sore nipple and engorgement prevention and tx reviewed. Mother informed of post-discharge support and given phone number to the lactation department, including services for phone call assistance; out-patient appointments; and breastfeeding support group. List of other breastfeeding resources in the community given in the handout. Encouraged mother to call for problems or concerns related to breastfeeding.   Maternal Data    Feeding Feeding Type: Formula  LATCH Score/Interventions                Intervention(s): Breastfeeding basics reviewed     Lactation Tools Discussed/Used     Consult Status Consult Status: Complete Date: 04/29/15    Myer Haff 04/29/2015, 12:11 PM

## 2015-04-30 ENCOUNTER — Ambulatory Visit: Payer: Self-pay

## 2015-04-30 ENCOUNTER — Inpatient Hospital Stay (HOSPITAL_COMMUNITY): Admission: RE | Admit: 2015-04-30 | Payer: Medicaid Other | Source: Ambulatory Visit

## 2015-04-30 NOTE — Lactation Note (Signed)
This note was copied from the chart of Adrienne Shanita [name]. Lactation Consultation Note  Patient Name: Adrienne Natalye Kott XMIWO'E Date: 04/30/2015 Reason for consult: Follow-up assessment;Hyperbilirubinemia;Other (Comment) (photo tx DC this am ) Baby is 22 hours old and has been to the breast consistently and moms choice to supplement ( keeping it low )  Per mom breast are filling and feeling warmer. Per mom the baby seems more satisfied and is latching better.  Sometimes I hear this funny noise like she isn't on as deep . LC assessed breast tissue with moms permission.  Nipples appear healthy , no break down noted, areolas semi compressible with some swelling - LC instructed on  the use shells. Sore nipple and engorgement prevention and tx reviewed. Mom already has a hand pump. Mother informed of post-discharge support and given phone number to the lactation department, including services  for phone call assistance; out-patient appointments; and breastfeeding support group. List of other breastfeeding resources  in the community given in the handout. Encouraged mother to call for problems or concerns related to breastfeeding.  Maternal Data Has patient been taught Hand Expression?: Yes  Feeding    LATCH Score/Interventions                Intervention(s): Breastfeeding basics reviewed     Lactation Tools Discussed/Used Tools: Shells Shell Type: Inverted   Consult Status Consult Status: Complete Date: 04/30/15    Myer Haff 04/30/2015, 12:13 PM

## 2017-08-08 DIAGNOSIS — F419 Anxiety disorder, unspecified: Secondary | ICD-10-CM

## 2017-08-08 HISTORY — DX: Anxiety disorder, unspecified: F41.9

## 2018-01-22 ENCOUNTER — Ambulatory Visit
Admission: RE | Admit: 2018-01-22 | Discharge: 2018-01-22 | Disposition: A | Discharge status: Home / Self Care | Payer: PRIVATE HEALTH INSURANCE | Source: Ambulatory Visit | Attending: Family Medicine | Admitting: Family Medicine

## 2018-01-22 ENCOUNTER — Other Ambulatory Visit: Payer: Self-pay | Admitting: Family Medicine

## 2018-01-22 DIAGNOSIS — R599 Enlarged lymph nodes, unspecified: Secondary | ICD-10-CM

## 2018-01-22 MED ORDER — IOPAMIDOL (ISOVUE-300) INJECTION 61%
75.0000 mL | Freq: Once | INTRAVENOUS | Status: AC | PRN
  Administered 2018-01-22: 75 mL via INTRAVENOUS

## 2018-01-24 ENCOUNTER — Other Ambulatory Visit (HOSPITAL_COMMUNITY): Payer: Self-pay | Admitting: Family Medicine

## 2018-01-24 DIAGNOSIS — R590 Localized enlarged lymph nodes: Secondary | ICD-10-CM

## 2018-01-29 ENCOUNTER — Other Ambulatory Visit: Payer: Self-pay | Admitting: Radiology

## 2018-01-31 ENCOUNTER — Ambulatory Visit (HOSPITAL_COMMUNITY)
Admission: RE | Admit: 2018-01-31 | Discharge: 2018-01-31 | Disposition: A | Discharge status: Home / Self Care | Payer: 59 | Source: Ambulatory Visit | Attending: Family Medicine | Admitting: Family Medicine

## 2018-01-31 ENCOUNTER — Encounter (HOSPITAL_COMMUNITY): Payer: Self-pay

## 2018-01-31 DIAGNOSIS — Z87440 Personal history of urinary (tract) infections: Secondary | ICD-10-CM | POA: Insufficient documentation

## 2018-01-31 DIAGNOSIS — R590 Localized enlarged lymph nodes: Secondary | ICD-10-CM | POA: Insufficient documentation

## 2018-01-31 DIAGNOSIS — Z87891 Personal history of nicotine dependence: Secondary | ICD-10-CM | POA: Insufficient documentation

## 2018-01-31 HISTORY — DX: Dyspnea, unspecified: R06.00

## 2018-01-31 HISTORY — DX: Anxiety disorder, unspecified: F41.9

## 2018-01-31 HISTORY — DX: Major depressive disorder, single episode, unspecified: F32.9

## 2018-01-31 LAB — BASIC METABOLIC PANEL
Anion gap: 8 (ref 5–15)
BUN: 12 mg/dL (ref 6–20)
CALCIUM: 9.8 mg/dL (ref 8.9–10.3)
CO2: 27 mmol/L (ref 22–32)
CREATININE: 0.72 mg/dL (ref 0.44–1.00)
Chloride: 107 mmol/L (ref 98–111)
GFR calc non Af Amer: >60 mL/min (ref >60)
GLUCOSE: 87 mg/dL (ref 70–99)
Potassium: 4.1 mmol/L (ref 3.5–5.1)
Sodium: 142 mmol/L (ref 135–145)

## 2018-01-31 LAB — CBC WITH DIFFERENTIAL/PLATELET
BASOS PCT: 1 %
Basophils Absolute: 0 10*3/uL (ref 0.0–0.1)
EOS ABS: 0.5 10*3/uL (ref 0.0–0.7)
Eosinophils Relative: 7 %
HCT: 39 % (ref 36.0–46.0)
Hemoglobin: 12.9 g/dL (ref 12.0–15.0)
Lymphocytes Relative: 22 %
Lymphs Abs: 1.6 10*3/uL (ref 0.7–4.0)
MCH: 29.1 pg (ref 26.0–34.0)
MCHC: 33.1 g/dL (ref 30.0–36.0)
MCV: 88 fL (ref 78.0–100.0)
MONO ABS: 0.7 10*3/uL (ref 0.1–1.0)
MONOS PCT: 9 %
NEUTROS PCT: 61 %
Neutro Abs: 4.6 10*3/uL (ref 1.7–7.7)
Platelets: 324 10*3/uL (ref 150–400)
RBC: 4.43 MIL/uL (ref 3.87–5.11)
RDW: 12.5 % (ref 11.5–15.5)
WBC: 7.4 10*3/uL (ref 4.0–10.5)

## 2018-01-31 LAB — PROTIME-INR
INR: 1.2
Prothrombin Time: 15.1 seconds (ref 11.4–15.2)

## 2018-01-31 MED ORDER — FENTANYL CITRATE (PF) 100 MCG/2ML IJ SOLN
INTRAMUSCULAR | Status: AC
  Filled 2018-01-31: qty 4

## 2018-01-31 MED ORDER — LIDOCAINE HCL 1 % IJ SOLN
INTRAMUSCULAR | Status: AC
  Filled 2018-01-31: qty 10

## 2018-01-31 MED ORDER — SODIUM CHLORIDE 0.9 % IV SOLN
INTRAVENOUS | Status: DC
  Administered 2018-01-31: 11:00:00 via INTRAVENOUS

## 2018-01-31 MED ORDER — MIDAZOLAM HCL 2 MG/2ML IJ SOLN
INTRAMUSCULAR | Status: AC | PRN
  Administered 2018-01-31 (×4): 1 mg via INTRAVENOUS

## 2018-01-31 MED ORDER — FENTANYL CITRATE (PF) 100 MCG/2ML IJ SOLN
INTRAMUSCULAR | Status: AC | PRN
  Administered 2018-01-31 (×3): 50 ug via INTRAVENOUS

## 2018-01-31 MED ORDER — FLUMAZENIL 0.5 MG/5ML IV SOLN
INTRAVENOUS | Status: AC
  Filled 2018-01-31: qty 5

## 2018-01-31 MED ORDER — NALOXONE HCL 0.4 MG/ML IJ SOLN
INTRAMUSCULAR | Status: AC
  Filled 2018-01-31: qty 1

## 2018-01-31 MED ORDER — MIDAZOLAM HCL 2 MG/2ML IJ SOLN
INTRAMUSCULAR | Status: AC
  Filled 2018-01-31: qty 4

## 2018-01-31 MED ORDER — HYDROCODONE-ACETAMINOPHEN 5-325 MG PO TABS: 1.0000 | ORAL_TABLET | ORAL | Status: DC | PRN

## 2018-01-31 NOTE — H&P (Signed)
Chief Complaint: Patient was seen in consultation today for lymphadenopathy  Referring Physician(s): Damaris Hippo  Supervising Physician: Markus Daft  Patient Status: [facility] - Out-pt  History of Present Illness: Adrienne [name] is a 27 y.o. female with history of anxiety and depression who was recently found to have a "lump on her neck."   CT Soft Tissue Neck 01/22/18 showed: Adenopathy throughout the right lateral neck, right supraclavicular fossa, and upper mediastinum. There is no specific imaging feature, lymphoma or atypical infection are the primary considerations and excisional biopsy should be considered if there is no diagnosis by history or labs.  IR consulted for biopsy at the request of Dr. Inda Castle.  Patient presents today in her usual state of health. She has been on prophylactic antibiotics per her PCP.  She has been NPO.  She does not take blood thinners.   Past Medical History:  Diagnosis Date  . Anxiety 2019  . Depression 2016  . Dyspnea 01/2009   with sternal area discomfort, Pt questions anxiety  . Frequent UTI     Past Surgical History:  Procedure Laterality Date  . INDUCED ABORTION    . MOUTH SURGERY    . WRIST SURGERY      Allergies: Patient has no known allergies.  Medications: Prior to Admission medications   Medication Sig Start Date End Date Taking? Authorizing Provider  amoxicillin-clavulanate (AUGMENTIN) 500-125 MG tablet Take 1 tablet by mouth 2 (two) times daily.   Yes [provider]  ibuprofen (ADVIL,MOTRIN) 600 MG tablet Take 1 tablet (600 mg total) by mouth every 6 (six) hours. 04/29/15  Yes Jerelyn Charles, MD  Multiple Vitamin (MULTIVITAMIN) capsule Take 1 capsule by mouth daily.   Yes [provider]  Prenatal Vit-Fe Fumarate-FA (PRENATAL MULTIVITAMIN) TABS tablet Take 1 tablet by mouth daily at 12 noon.    [provider]     Family History  Problem Relation Age of Onset  . Cancer Paternal Grandmother         lung  . Heart disease Paternal Grandfather        died from heart attack  . Alcohol abuse Neg Hx   . Arthritis Neg Hx   . Asthma Neg Hx   . Birth defects Neg Hx   . COPD Neg Hx   . Depression Neg Hx   . Diabetes Neg Hx   . Drug abuse Neg Hx   . Early death Neg Hx   . Hearing loss Neg Hx   . Hyperlipidemia Neg Hx   . Hypertension Neg Hx   . Kidney disease Neg Hx   . Learning disabilities Neg Hx   . Mental illness Neg Hx   . Mental retardation Neg Hx   . Miscarriages / Stillbirths Neg Hx   . Stroke Neg Hx   . Vision loss Neg Hx   . Varicose Veins Neg Hx     Social History   Socioeconomic History  . Marital status: Single    Spouse name: Not on file  . Number of children: Not on file  . Years of education: Not on file  . Highest education level: Not on file  Occupational History  . Not on file  Social Needs  . Financial resource strain: Not on file  . Food insecurity:    Worry: Not on file    Inability: Not on file  . Transportation needs:    Medical: Not on file    Non-medical: Not on file  Tobacco Use  .  Smoking status: Former Research scientist (life sciences)  . Smokeless tobacco: Never Used  . Tobacco comment: 2015  Substance and Sexual Activity  . Alcohol use: No    Alcohol/week: 0.0 oz    Comment: socially   . Drug use: No  . Sexual activity: Yes    Birth control/protection: None  Lifestyle  . Physical activity:    Days per week: Not on file    Minutes per session: Not on file  . Stress: Not on file  Relationships  . Social connections:    Talks on phone: Not on file    Gets together: Not on file    Attends religious service: Not on file    Active member of club or organization: Not on file    Attends meetings of clubs or organizations: Not on file    Relationship status: Not on file  Other Topics Concern  . Not on file  Social History Narrative  . Not on file     Review of Systems: A 12 point ROS discussed and pertinent positives are indicated in the HPI  above.  All other systems are negative.  Review of Systems  Constitutional: Negative for fatigue and fever.  Respiratory: Negative for cough and shortness of breath.   Cardiovascular: Negative for chest pain.  Gastrointestinal: Negative for abdominal pain.  Musculoskeletal: Negative for back pain.  Psychiatric/Behavioral: Negative for behavioral problems and confusion.    Vital Signs: LMP 01/28/2018 (Exact Date)   Breastfeeding? No   Physical Exam  Constitutional: She is oriented to person, place, and time. She appears well-developed.  Cardiovascular: Normal rate, regular rhythm and normal heart sounds.  Pulmonary/Chest: Effort normal and breath sounds normal. No respiratory distress.  Abdominal: Soft.  Neurological: She is alert and oriented to person, place, and time.  Skin: Skin is warm and dry.  Psychiatric: She has a normal mood and affect. Her behavior is normal. Judgment and thought content normal.  Nursing note and vitals reviewed.    MD Evaluation Airway: WNL Heart: WNL Abdomen: WNL Chest/ Lungs: WNL ASA  Classification: 3 Mallampati/Airway Score: One   Imaging: Ct Soft Tissue Neck W Contrast  Result Date: 01/22/2018 CLINICAL DATA:  Enlarged lymph nodes, of uncertain chronicity. EXAM: CT NECK WITH CONTRAST TECHNIQUE: Multidetector CT imaging of the neck was performed using the standard protocol following the bolus administration of intravenous contrast. CONTRAST:  75 cc Isovue 300 intravenous COMPARISON:  None. FINDINGS: Pharynx and larynx: No evidence of mass or inflammation. Salivary glands: Negative.  No mass or inflammation seen. Thyroid: Negative Lymph nodes: Adenopathy asymmetric in the right neck involving the posterior triangle more than jugular chain, right supraclavicular fossa, thoracic inlet. A nodal conglomerate in the supraclavicular fossa measures up to 2.6 cm in AP span. There is contiguous adenopathy in the upper mediastinum. Some of the right  supraclavicular fat is infiltrated, nonspecific, with no discrete necrosis. Vascular: The lower right internal jugular vein is extrinsically narrowed by the adenopathy, without thrombosis or flow limiting stenosis. Limited intracranial: Negative Visualized orbits: Negative Mastoids and visualized paranasal sinuses: Mucosal thickening in the ethmoid and maxillary sinuses. Under pneumatized right mastoid. Skeleton: No acute or aggressive finding. Upper chest: Negative visualized chest wall and breast. IMPRESSION: Adenopathy throughout the right lateral neck, right supraclavicular fossa, and upper mediastinum. There is no specific imaging feature, lymphoma or atypical infection are the primary considerations and excisional biopsy should be considered if there is no diagnosis by history or labs. Electronically Signed   By: Monte Fantasia  M.D.   On: 01/22/2018 15:38    Labs:  CBC: Recent Labs    01/31/18 1123  WBC 7.4  HGB 12.9  HCT 39.0  PLT 324    COAGS: Recent Labs    01/31/18 1123  INR 1.20    BMP: Recent Labs    01/31/18 1123  NA 142  K 4.1  CL 107  CO2 27  GLUCOSE 87  BUN 12  CALCIUM 9.8  CREATININE 0.72  GFRNONAA >60  GFRAA >60    LIVER FUNCTION TESTS: No results for input(s): BILITOT, AST, ALT, ALKPHOS, PROT, ALBUMIN in the last 8760 hours.  TUMOR MARKERS: No results for input(s): AFPTM, CEA, CA199, CHROMGRNA in the last 8760 hours.  Assessment and Plan: Patient with past medical history of anxiety, depression presents with complaint of right neck mass, found to have cervical lymphadenopathy on recent CT scan.  IR consulted for lymph node biopsy at the request of Dr. Inda Castle. Case reviewed by Dr. Anselm Pancoast who approves patient for procedure.  Patient presents today in their usual state of health.  She has been NPO and is not currently on blood thinners.   Risks and benefits discussed with the patient including, but not limited to bleeding, infection, damage to  adjacent structures or low yield requiring additional tests.  All of the patient's questions were answered, patient is agreeable to proceed. Consent signed and in chart.   Thank you for this interesting consult.  I greatly enjoyed meeting Adrienne [name] and look forward to participating in their care.  A copy of this report was sent to the requesting provider on this date.  Electronically Signed: Docia Barrier, PA 01/31/2018, 12:18 PM   I spent a total of  30 Minutes   in face to face in clinical consultation, greater than 50% of which was counseling/coordinating care for lymphadenopathy.

## 2018-01-31 NOTE — Procedures (Signed)
US guided core biopsies of right cervical lymph nodes.  Nodes are clumped together in neck. Minimal blood loss and no immediate complication.

## 2018-01-31 NOTE — Discharge Instructions (Signed)
Biopsy, Care After These instructions give you information about caring for yourself after your procedure. Your doctor may also give you more specific instructions. Call your doctor if you have any problems or questions after your procedure. Follow these instructions at home: Medicines  Take over-the-counter and prescription medicines only as told by your doctor.  Do not drive for 24 hours if you received a sedative.  Do not drink alcohol while taking pain medicine.  Do not drive or use heavy machinery while taking prescription pain medicine. Biopsy Site Care   Follow instructions from your doctor about how to take care of your cut from surgery (incision) or puncture area. Make sure you: ? Wash your hands with soap and water before you change your bandage. If you cannot use soap and water, use hand sanitizer. ? Change any bandages (dressings) as told by your doctor. May remove dressing and shower in 24 to 48 hours. Keep wound clean and dry. ? Leave any skin glue, or skin tape (adhesive) strips in place.   Check your cut or puncture area every day for signs of infection. Check for: ? More redness, swelling, or pain. ? More fluid or blood. ? Warmth. ? Pus or a bad smell.  Protect the biopsy area.   Activity  Avoid activities that could pull the biopsy site open. ? Avoid stretching. ? Avoid reaching. ? Avoid exercise. ? Avoid sports. ? Avoid lifting anything that is heavier than 3 pounds (1.4 kg). For several days  Return to your normal activities as told by your doctor. Ask your doctor what activities are safe for you. General instructions  Continue your normal diet.  keep all follow-up visits as told by your doctor. This is important. Contact a health care provider if:  You have more redness, swelling, or pain at the biopsy site.  You have more fluid or blood coming from your biopsy site.  Your biopsy site feels warm to the touch.  You have pus or a bad smell coming  from the biopsy site.  Your biopsy site breaks open after the stitches, staples, or skin tape strips have been removed.  You have a rash.  You have a fever. Get help right away if:  You have more bleeding (more than a small spot) from the biopsy site.  You have trouble breathing.  You have red streaks around the biopsy site.   This information is not intended to replace advice given to you by your health care provider. Make sure you discuss any questions you have with your health care provider. Document Released: 05/21/2009 Document Revised: 03/31/2016 Document Reviewed: 04/28/2015 Elsevier Interactive Patient Education  2018 [place].   Moderate Conscious Sedation, Adult, Care After These instructions provide you with information about caring for yourself after your procedure. Your health care provider may also give you more specific instructions. Your treatment has been planned according to current medical practices, but problems sometimes occur. Call your health care provider if you have any problems or questions after your procedure. What can I expect after the procedure? After your procedure, it is common:  To feel sleepy for several hours.  To feel clumsy and have poor balance for several hours.  To have poor judgment for several hours.  To vomit if you eat too soon.  Follow these instructions at home: For at least 24 hours after the procedure:   Do not: ? Participate in activities where you could fall or become injured. ? Drive. ? Use heavy machinery. ?  Drink alcohol. ? Take sleeping pills or medicines that cause drowsiness. ? Make important decisions or sign legal documents. ? Take care of children on your own.  Rest. Eating and drinking  Follow the diet recommended by your health care provider.  If you vomit: ? Drink water, juice, or soup when you can drink without vomiting. ? Make sure you have little or no nausea before eating solid  foods. General instructions  Have a responsible adult stay with you until you are awake and alert.  Take over-the-counter and prescription medicines only as told by your health care provider.  If you smoke, do not smoke without supervision.  Keep all follow-up visits as told by your health care provider. This is important. Contact a health care provider if:  You keep feeling nauseous or you keep vomiting.  You feel light-headed.  You develop a rash.  You have a fever. Get help right away if:  You have trouble breathing. This information is not intended to replace advice given to you by your health care provider. Make sure you discuss any questions you have with your health care provider. Document Released: 05/15/2013 Document Revised: 12/28/2015 Document Reviewed: 11/14/2015 Elsevier Interactive Patient Education  [employer].

## 2018-02-12 ENCOUNTER — Encounter (HOSPITAL_BASED_OUTPATIENT_CLINIC_OR_DEPARTMENT_OTHER): Payer: Self-pay | Admitting: *Deleted

## 2018-02-12 ENCOUNTER — Ambulatory Visit: Payer: Self-pay | Admitting: Surgery

## 2018-02-12 ENCOUNTER — Other Ambulatory Visit: Payer: Self-pay

## 2018-02-12 NOTE — H&P (Signed)
Adrienne [name] Documented: 02/12/2018 10:43 AM Location: [facility] Patient #: [id number] DOB: 12/31/1990 Undefined / Language: [name] / Race: White Female  History of Present Illness Adrienne Moores A. Cezar Misiaszek MD; 02/12/2018 11:25 AM) Patient words: Patient sent at the request of Dr. [name] for right cervical adenopathy. This is been present for at least one month. The patient denies any recent history of upper respiratory tract infection, or infection, sore throat or other recent illness or exposure. She has lost 30 pounds but this purpose. Her appetite is good. No history of night sweats. No history of chronic cough. CT scan was done which shows significant right anterior cervical adenopathy extending into her right anterior mediastinum. Core biopsy was done which showed atypical lymphoid proliferation but flow cytometry was otherwise negative. She relates no recent history of infection or trauma.                             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 - SEE COMMENT.         Enlarged lymph nodes, of uncertain chronicity.  EXAM: CT NECK WITH CONTRAST  TECHNIQUE: Multidetector CT imaging of the neck was performed using the standard protocol following the bolus administration of intravenous contrast.  CONTRAST: 75 cc Isovue 300 intravenous  COMPARISON: None.  FINDINGS: Pharynx and larynx: No evidence of mass or inflammation.  Salivary glands: Negative. No mass or inflammation seen.  Thyroid: Negative  Lymph nodes: Adenopathy asymmetric in the right neck involving the posterior triangle more than jugular chain, right supraclavicular fossa, thoracic inlet. A nodal conglomerate in the supraclavicular fossa measures up to 2.6 cm in AP span. There is contiguous adenopathy in the upper mediastinum. Some of the right supraclavicular fat is infiltrated,  nonspecific, with no discrete necrosis.  Vascular: The lower right internal jugular vein is extrinsically narrowed by the adenopathy, without thrombosis or flow limiting stenosis.  Limited intracranial: Negative  Visualized orbits: Negative  Mastoids and visualized paranasal sinuses: Mucosal thickening in the ethmoid and maxillary sinuses. Under pneumatized right mastoid.  Skeleton: No acute or aggressive finding.  Upper chest: Negative visualized chest wall and breast.  IMPRESSION: Adenopathy throughout the right lateral neck, right supraclavicular fossa, and upper mediastinum. There is no specific imaging feature, lymphoma or atypical infection are the primary considerations and excisional biopsy should be considered if there is no diagnosis by history or labs.   Electronically Signed By: Adrienne [name] M.D. On: 01/22/2018 15:38.  The patient is a 27 year old female.   Past Surgical History Adrienne Coke, RN; 02/12/2018 10:43 AM) Oral Surgery  Diagnostic Studies History Adrienne Coke, RN; 02/12/2018 10:43 AM) Colonoscopy never Mammogram never Pap Smear 1-5 years ago  Allergies Adrienne Coke, RN; 02/12/2018 10:45 AM) No Known Drug Allergies [02/12/2018]: Allergies Reconciled  Medication History (Adrienne Herrin, RN; 02/12/2018 10:46 AM) HydrOXYzine HCl (25MG  Tablet, Oral) Active. Multivitamin Adult (Oral) Active. Medications Reconciled  Social History Adrienne Coke, RN; 02/12/2018 10:43 AM) Alcohol use Occasional alcohol use. Caffeine use Coffee, Tea. Illicit drug use Remotely quit drug use. Tobacco use Former smoker.  Family History Adrienne Coke, RN; 02/12/2018 10:43 AM) Depression Family Members In General, Mother. Thyroid problems Family Members In General, Mother.  Pregnancy / Birth History Adrienne Coke, RN; 02/12/2018 10:43 AM) Age at menarche 14 years. Contraceptive History Intrauterine device. Gravida 2 Irregular periods Length (months)  of breastfeeding 3-6 Maternal age 25-20 Para  1  Other Problems Adrienne Coke, RN; 02/12/2018 10:43 AM) Anxiety Disorder Depression     Review of Systems (Adrienne Herrin RN; 02/12/2018 10:43 AM) General Present- Fatigue. Not Present- Appetite Loss, Chills, Fever, Night Sweats, Weight Gain and Weight Loss. Skin Not Present- Change in Wart/Mole, Dryness, Hives, Jaundice, New Lesions, Non-Healing Wounds, Rash and Ulcer. HEENT Present- Earache, Ringing in the Ears, Seasonal Allergies, Sinus Pain and Sore Throat. Not Present- Hearing Loss, Hoarseness, Nose Bleed, Oral Ulcers, Visual Disturbances, Wears glasses/contact lenses and Yellow Eyes. Respiratory Not Present- Bloody sputum, Chronic Cough, Difficulty Breathing, Snoring and Wheezing. Cardiovascular Present- Shortness of Breath. Not Present- Chest Pain, Difficulty Breathing Lying Down, Leg Cramps, Palpitations, Rapid Heart Rate and Swelling of Extremities. Gastrointestinal Not Present- Abdominal Pain, Bloating, Bloody Stool, Change in Bowel Habits, Chronic diarrhea, Constipation, Difficulty Swallowing, Excessive gas, Gets full quickly at meals, Hemorrhoids, Indigestion, Nausea, Rectal Pain and Vomiting. Female Genitourinary Not Present- Frequency, Nocturia, Painful Urination, Pelvic Pain and Urgency. Musculoskeletal Not Present- Back Pain, Joint Pain, Joint Stiffness, Muscle Pain, Muscle Weakness and Swelling of Extremities. Neurological Present- Headaches. Not Present- Decreased Memory, Fainting, Numbness, Seizures, Tingling, Tremor, Trouble walking and Weakness. Psychiatric Present- Anxiety and Depression. Not Present- Bipolar, Change in Sleep Pattern, Fearful and Frequent crying. Endocrine Not Present- Cold Intolerance, Excessive Hunger, Hair Changes, Heat Intolerance, Hot flashes and New Diabetes. Hematology Not Present- Blood Thinners, Easy Bruising, Excessive bleeding, Gland problems, HIV and Persistent Infections.  Vitals (Adrienne Herrin  RN; 02/12/2018 10:47 AM) 02/12/2018 10:46 AM Weight: 178.5 lb Height: 69in Body Surface Area: 1.97 m Body Mass Index: 26.36 kg/m  Temp.: 98.33F(Oral)  Pulse: 102 (Regular)  P.OX: 98% (Room air) BP: 102/64 (Sitting, Left Arm, Standard)      Physical Exam (Adrienne Nolasco A. Haidar Muse MD; 02/12/2018 11:26 AM)  General Mental Status-Alert. General Appearance-Consistent with stated age. Hydration-Well hydrated. Voice-Normal.  Head and Neck Note: Significant level III anterior right cervical adenopathy noted. OpSite noted. Thyroid normal. Trachea normal. No left-sided adenopathy.  Chest and Lung Exam Chest and lung exam reveals -quiet, even and easy respiratory effort with no use of accessory muscles and on auscultation, normal breath sounds, no adventitious sounds and normal vocal resonance. Inspection Chest Wall - Normal. Back - normal.  Cardiovascular Cardiovascular examination reveals -normal heart sounds, regular rate and rhythm with no murmurs and normal pedal pulses bilaterally.  Abdomen Inspection Inspection of the abdomen reveals - No Hernias. Skin - Scar - no surgical scars. Palpation/Percussion Palpation and Percussion of the abdomen reveal - Soft, Non Tender, No Rebound tenderness, No Rigidity (guarding) and No hepatosplenomegaly. Auscultation Auscultation of the abdomen reveals - Bowel sounds normal.  Neurologic Neurologic evaluation reveals -alert and oriented x 3 with no impairment of recent or remote memory. Mental Status-Normal.  Lymphatic Head & Neck -Note:Right sided anterior cervical adenopathy noted.  Axillary  General Axillary Region: Bilateral - Description - Normal. Tenderness - Non Tender.    Assessment & Plan (Adrienne Baiza A. Holli Rengel MD; 02/12/2018 11:27 AM)  ANTERIOR CERVICAL ADENOPATHY (R59.0) Impression: Recommend cervical adenopathy excisional biopsy in the right given CT findings. This is to exclude lymphoproliferative disorder  and other possible disorders. This is a level III lymph node. Risk of bleeding, infection, nerve injury, blood vessel injury, AngioJet or mediastinal or neck structures, surgery, and any further treatment was discussed. She and her mother agree to proceed.  Current Plans The anatomy and the physiology was discussed. The pathophysiology and natural history of the disease was discussed. Options were discussed and recommendations  were made. Technique, risks, benefits, & alternatives were discussed. Risks such as stroke, heart attack, bleeding, indection, death, and other risks discussed. Questions answered. The patient agrees to proceed. Pt Education - [facility] Free Text Education/Instructions: discussed with patient and provided information.

## 2018-02-12 NOTE — H&P (View-Only) (Signed)
Adrienne [name] Documented: 02/12/2018 10:43 AM Location: [facility] Patient #: [id number] DOB: [birth year] Undefined / Language: [name] / Race: White Female  History of Present Illness [name] A. [name] MD; 02/12/2018 11:25 AM) Patient words: Patient sent at the request of Dr. [name] for right cervical adenopathy. This is been present for at least one month. The patient denies any recent history of upper respiratory tract infection, or infection, sore throat or other recent illness or exposure. She has lost 30 pounds but this purpose. Her appetite is good. No history of night sweats. No history of chronic cough. CT scan was done which shows significant right anterior cervical adenopathy extending into her right anterior mediastinum. Core biopsy was done which showed atypical lymphoid proliferation but flow cytometry was otherwise negative. She relates no recent history of infection or trauma.                             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 - SEE COMMENT.         Enlarged lymph nodes, of uncertain chronicity.  EXAM: CT NECK WITH CONTRAST  TECHNIQUE: Multidetector CT imaging of the neck was performed using the standard protocol following the bolus administration of intravenous contrast.  CONTRAST: 75 cc Isovue 300 intravenous  COMPARISON: None.  FINDINGS: Pharynx and larynx: No evidence of mass or inflammation.  Salivary glands: Negative. No mass or inflammation seen.  Thyroid: Negative  Lymph nodes: Adenopathy asymmetric in the right neck involving the posterior triangle more than jugular chain, right supraclavicular fossa, thoracic inlet. A nodal conglomerate in the supraclavicular fossa measures up to 2.6 cm in AP span. There is contiguous adenopathy in the upper mediastinum. Some of the right supraclavicular fat is infiltrated,  nonspecific, with no discrete necrosis.  Vascular: The lower right internal jugular vein is extrinsically narrowed by the adenopathy, without thrombosis or flow limiting stenosis.  Limited intracranial: Negative  Visualized orbits: Negative  Mastoids and visualized paranasal sinuses: Mucosal thickening in the ethmoid and maxillary sinuses. Under pneumatized right mastoid.  Skeleton: No acute or aggressive finding.  Upper chest: Negative visualized chest wall and breast.  IMPRESSION: Adenopathy throughout the right lateral neck, right supraclavicular fossa, and upper mediastinum. There is no specific imaging feature, lymphoma or atypical infection are the primary considerations and excisional biopsy should be considered if there is no diagnosis by history or labs.   Electronically Signed By: Monte Fantasia M.D. On: 01/22/2018 15:38.  The patient is a 27 year old female.   Past Surgical History Lindwood Coke, RN; 02/12/2018 10:43 AM) Oral Surgery  Diagnostic Studies History Lindwood Coke, RN; 02/12/2018 10:43 AM) Colonoscopy never Mammogram never Pap Smear 1-5 years ago  Allergies Lindwood Coke, RN; 02/12/2018 10:45 AM) No Known Drug Allergies [02/12/2018]: Allergies Reconciled  Medication History (Diane Herrin, RN; 02/12/2018 10:46 AM) HydrOXYzine HCl (25MG  Tablet, Oral) Active. Multivitamin Adult (Oral) Active. Medications Reconciled  Social History Lindwood Coke, RN; 02/12/2018 10:43 AM) Alcohol use Occasional alcohol use. Caffeine use Coffee, Tea. Illicit drug use Remotely quit drug use. Tobacco use Former smoker.  Family History Lindwood Coke, RN; 02/12/2018 10:43 AM) Depression Family Members In General, Mother. Thyroid problems Family Members In General, Mother.  Pregnancy / Birth History Lindwood Coke, RN; 02/12/2018 10:43 AM) Age at menarche 54 years. Contraceptive History Intrauterine device. Gravida 2 Irregular periods Length (months)  of breastfeeding 3-6 Maternal age 51-20 Para  1  Other Problems Lindwood Coke, RN; 02/12/2018 10:43 AM) Anxiety Disorder Depression     Review of Systems (Diane Herrin RN; 02/12/2018 10:43 AM) General Present- Fatigue. Not Present- Appetite Loss, Chills, Fever, Night Sweats, Weight Gain and Weight Loss. Skin Not Present- Change in Wart/Mole, Dryness, Hives, Jaundice, New Lesions, Non-Healing Wounds, Rash and Ulcer. HEENT Present- Earache, Ringing in the Ears, Seasonal Allergies, Sinus Pain and Sore Throat. Not Present- Hearing Loss, Hoarseness, Nose Bleed, Oral Ulcers, Visual Disturbances, Wears glasses/contact lenses and Yellow Eyes. Respiratory Not Present- Bloody sputum, Chronic Cough, Difficulty Breathing, Snoring and Wheezing. Cardiovascular Present- Shortness of Breath. Not Present- Chest Pain, Difficulty Breathing Lying Down, Leg Cramps, Palpitations, Rapid Heart Rate and Swelling of Extremities. Gastrointestinal Not Present- Abdominal Pain, Bloating, Bloody Stool, Change in Bowel Habits, Chronic diarrhea, Constipation, Difficulty Swallowing, Excessive gas, Gets full quickly at meals, Hemorrhoids, Indigestion, Nausea, Rectal Pain and Vomiting. Female Genitourinary Not Present- Frequency, Nocturia, Painful Urination, Pelvic Pain and Urgency. Musculoskeletal Not Present- Back Pain, Joint Pain, Joint Stiffness, Muscle Pain, Muscle Weakness and Swelling of Extremities. Neurological Present- Headaches. Not Present- Decreased Memory, Fainting, Numbness, Seizures, Tingling, Tremor, Trouble walking and Weakness. Psychiatric Present- Anxiety and Depression. Not Present- Bipolar, Change in Sleep Pattern, Fearful and Frequent crying. Endocrine Not Present- Cold Intolerance, Excessive Hunger, Hair Changes, Heat Intolerance, Hot flashes and New Diabetes. Hematology Not Present- Blood Thinners, Easy Bruising, Excessive bleeding, Gland problems, HIV and Persistent Infections.  Vitals (Diane Herrin  RN; 02/12/2018 10:47 AM) 02/12/2018 10:46 AM Weight: 178.5 lb Height: 69in Body Surface Area: 1.97 m Body Mass Index: 26.36 kg/m  Temp.: 98.57F(Oral)  Pulse: 102 (Regular)  P.OX: 98% (Room air) BP: 102/64 (Sitting, Left Arm, Standard)      Physical Exam (Bronc Brosseau A. Neema Fluegge MD; 02/12/2018 11:26 AM)  General Mental Status-Alert. General Appearance-Consistent with stated age. Hydration-Well hydrated. Voice-Normal.  Head and Neck Note: Significant level III anterior right cervical adenopathy noted. OpSite noted. Thyroid normal. Trachea normal. No left-sided adenopathy.  Chest and Lung Exam Chest and lung exam reveals -quiet, even and easy respiratory effort with no use of accessory muscles and on auscultation, normal breath sounds, no adventitious sounds and normal vocal resonance. Inspection Chest Wall - Normal. Back - normal.  Cardiovascular Cardiovascular examination reveals -normal heart sounds, regular rate and rhythm with no murmurs and normal pedal pulses bilaterally.  Abdomen Inspection Inspection of the abdomen reveals - No Hernias. Skin - Scar - no surgical scars. Palpation/Percussion Palpation and Percussion of the abdomen reveal - Soft, Non Tender, No Rebound tenderness, No Rigidity (guarding) and No hepatosplenomegaly. Auscultation Auscultation of the abdomen reveals - Bowel sounds normal.  Neurologic Neurologic evaluation reveals -alert and oriented x 3 with no impairment of recent or remote memory. Mental Status-Normal.  Lymphatic Head & Neck -Note:Right sided anterior cervical adenopathy noted.  Axillary  General Axillary Region: Bilateral - Description - Normal. Tenderness - Non Tender.    Assessment & Plan (Edessa Jakubowicz A. Yomaira Solar MD; 02/12/2018 11:27 AM)  ANTERIOR CERVICAL ADENOPATHY (R59.0) Impression: Recommend cervical adenopathy excisional biopsy in the right given CT findings. This is to exclude lymphoproliferative disorder  and other possible disorders. This is a level III lymph node. Risk of bleeding, infection, nerve injury, blood vessel injury, AngioJet or mediastinal or neck structures, surgery, and any further treatment was discussed. She and her mother agree to proceed.  Current Plans The anatomy and the physiology was discussed. The pathophysiology and natural history of the disease was discussed. Options were discussed and recommendations  were made. Technique, risks, benefits, & alternatives were discussed. Risks such as stroke, heart attack, bleeding, indection, death, and other risks discussed. Questions answered. The patient agrees to proceed. Pt Education - C[facility] Free Text Education/Instructions: discussed with patient and provided information.

## 2018-02-14 NOTE — Anesthesia Preprocedure Evaluation (Addendum)
Anesthesia Evaluation  Patient identified by MRN, date of birth, ID band Patient awake    Reviewed: Allergy & Precautions, NPO status , Patient's Chart, lab work & pertinent test results  Airway Mallampati: II  TM Distance: >3 FB Neck ROM: Full    Dental no notable dental hx. (+) Teeth Intact, Dental Advisory Given   Pulmonary former smoker,    Pulmonary exam normal breath sounds clear to auscultation       Cardiovascular negative cardio ROS Normal cardiovascular exam Rhythm:Regular Rate:Normal     Neuro/Psych Anxiety negative neurological ROS     GI/Hepatic negative GI ROS, Neg liver ROS,   Endo/Other    Renal/GU negative Renal ROS     Musculoskeletal   Abdominal   Peds  Hematology   Anesthesia Other Findings   Reproductive/Obstetrics negative OB ROS                             Anesthesia Physical Anesthesia Plan  ASA: I  Anesthesia Plan: General   Post-op Pain Management:    Induction: Intravenous  PONV Risk Score and Plan: Treatment may vary due to age or medical condition, Ondansetron and Dexamethasone  Airway Management Planned: LMA  Additional Equipment:   Intra-op Plan:   Post-operative Plan:   Informed Consent: I have reviewed the patients History and Physical, chart, labs and discussed the procedure including the risks, benefits and alternatives for the proposed anesthesia with the patient or authorized representative who has indicated his/her understanding and acceptance.   Dental advisory given  Plan Discussed with: CRNA  Anesthesia Plan Comments:         Anesthesia Quick Evaluation

## 2018-02-15 ENCOUNTER — Ambulatory Visit (HOSPITAL_BASED_OUTPATIENT_CLINIC_OR_DEPARTMENT_OTHER): Payer: 59 | Admitting: Anesthesiology

## 2018-02-15 ENCOUNTER — Encounter (HOSPITAL_BASED_OUTPATIENT_CLINIC_OR_DEPARTMENT_OTHER)
Admission: RE | Disposition: A | Discharge status: Home / Self Care | Payer: Self-pay | Source: Ambulatory Visit | Attending: Surgery

## 2018-02-15 ENCOUNTER — Ambulatory Visit (HOSPITAL_BASED_OUTPATIENT_CLINIC_OR_DEPARTMENT_OTHER)
Admission: RE | Admit: 2018-02-15 | Discharge: 2018-02-15 | Disposition: A | Discharge status: Home / Self Care | Payer: 59 | Source: Ambulatory Visit | Attending: Surgery | Admitting: Surgery

## 2018-02-15 ENCOUNTER — Other Ambulatory Visit: Payer: Self-pay

## 2018-02-15 ENCOUNTER — Encounter (HOSPITAL_BASED_OUTPATIENT_CLINIC_OR_DEPARTMENT_OTHER): Payer: Self-pay | Admitting: *Deleted

## 2018-02-15 DIAGNOSIS — Z79899 Other long term (current) drug therapy: Secondary | ICD-10-CM | POA: Diagnosis not present

## 2018-02-15 DIAGNOSIS — F419 Anxiety disorder, unspecified: Secondary | ICD-10-CM | POA: Diagnosis not present

## 2018-02-15 DIAGNOSIS — R590 Localized enlarged lymph nodes: Secondary | ICD-10-CM | POA: Diagnosis present

## 2018-02-15 DIAGNOSIS — Z8249 Family history of ischemic heart disease and other diseases of the circulatory system: Secondary | ICD-10-CM | POA: Diagnosis not present

## 2018-02-15 DIAGNOSIS — Z87891 Personal history of nicotine dependence: Secondary | ICD-10-CM | POA: Diagnosis not present

## 2018-02-15 DIAGNOSIS — C8111 Nodular sclerosis classical Hodgkin lymphoma, lymph nodes of head, face, and neck: Secondary | ICD-10-CM | POA: Diagnosis not present

## 2018-02-15 HISTORY — PX: LYMPH NODE BIOPSY: SHX201

## 2018-02-15 SURGERY — LYMPH NODE BIOPSY
Anesthesia: General | Site: Neck | Laterality: Right

## 2018-02-15 MED ORDER — CHLORHEXIDINE GLUCONATE CLOTH 2 % EX PADS: 6.0000 | MEDICATED_PAD | Freq: Once | CUTANEOUS | Status: DC

## 2018-02-15 MED ORDER — GABAPENTIN 300 MG PO CAPS
ORAL_CAPSULE | ORAL | Status: AC
  Filled 2018-02-15: qty 1

## 2018-02-15 MED ORDER — FENTANYL CITRATE (PF) 100 MCG/2ML IJ SOLN
INTRAMUSCULAR | Status: AC
  Filled 2018-02-15: qty 2

## 2018-02-15 MED ORDER — DEXTROSE 5 % IV SOLN
3.0000 g | INTRAVENOUS | Status: AC
  Administered 2018-02-15: 2 g via INTRAVENOUS

## 2018-02-15 MED ORDER — ONDANSETRON HCL 4 MG/2ML IJ SOLN: 4.0000 mg | Freq: Once | INTRAMUSCULAR | Status: DC | PRN

## 2018-02-15 MED ORDER — HYDROMORPHONE HCL 1 MG/ML IJ SOLN
0.2500 mg | INTRAMUSCULAR | Status: DC | PRN
  Administered 2018-02-15: 0.25 mg via INTRAVENOUS

## 2018-02-15 MED ORDER — CEFAZOLIN SODIUM-DEXTROSE 2-4 GM/100ML-% IV SOLN
INTRAVENOUS | Status: AC
  Filled 2018-02-15: qty 100

## 2018-02-15 MED ORDER — ACETAMINOPHEN 10 MG/ML IV SOLN: 1000.0000 mg | Freq: Once | INTRAVENOUS | Status: DC | PRN

## 2018-02-15 MED ORDER — FENTANYL CITRATE (PF) 100 MCG/2ML IJ SOLN
50.0000 ug | INTRAMUSCULAR | Status: DC | PRN
  Administered 2018-02-15 (×2): 100 ug via INTRAVENOUS

## 2018-02-15 MED ORDER — DEXAMETHASONE SODIUM PHOSPHATE 4 MG/ML IJ SOLN
INTRAMUSCULAR | Status: DC | PRN
  Administered 2018-02-15: 10 mg via INTRAVENOUS

## 2018-02-15 MED ORDER — ACETAMINOPHEN 500 MG PO TABS
1000.0000 mg | ORAL_TABLET | Freq: Once | ORAL | Status: AC
  Administered 2018-02-15: 1000 mg via ORAL

## 2018-02-15 MED ORDER — LACTATED RINGERS IV SOLN
INTRAVENOUS | Status: DC
  Administered 2018-02-15: 07:00:00 via INTRAVENOUS

## 2018-02-15 MED ORDER — GABAPENTIN 300 MG PO CAPS
300.0000 mg | ORAL_CAPSULE | ORAL | Status: AC
  Administered 2018-02-15: 300 mg via ORAL

## 2018-02-15 MED ORDER — MIDAZOLAM HCL 2 MG/2ML IJ SOLN
INTRAMUSCULAR | Status: AC
  Filled 2018-02-15: qty 2

## 2018-02-15 MED ORDER — ONDANSETRON HCL 4 MG/2ML IJ SOLN
INTRAMUSCULAR | Status: AC
  Filled 2018-02-15: qty 2

## 2018-02-15 MED ORDER — IBUPROFEN 800 MG PO TABS: 800.0000 mg | ORAL_TABLET | Freq: Three times a day (TID) | ORAL | 0 refills | Status: DC | PRN

## 2018-02-15 MED ORDER — CELECOXIB 200 MG PO CAPS
200.0000 mg | ORAL_CAPSULE | ORAL | Status: AC
  Administered 2018-02-15: 200 mg via ORAL

## 2018-02-15 MED ORDER — PROPOFOL 10 MG/ML IV BOLUS
INTRAVENOUS | Status: AC
  Filled 2018-02-15: qty 20

## 2018-02-15 MED ORDER — CELECOXIB 200 MG PO CAPS
ORAL_CAPSULE | ORAL | Status: AC
  Filled 2018-02-15: qty 1

## 2018-02-15 MED ORDER — BUPIVACAINE-EPINEPHRINE 0.25% -1:200000 IJ SOLN
INTRAMUSCULAR | Status: DC | PRN
  Administered 2018-02-15: 6 mL

## 2018-02-15 MED ORDER — ONDANSETRON HCL 4 MG/2ML IJ SOLN
INTRAMUSCULAR | Status: DC | PRN
  Administered 2018-02-15: 4 mg via INTRAVENOUS

## 2018-02-15 MED ORDER — SCOPOLAMINE 1 MG/3DAYS TD PT72: 1.0000 | MEDICATED_PATCH | Freq: Once | TRANSDERMAL | Status: DC | PRN

## 2018-02-15 MED ORDER — MIDAZOLAM HCL 2 MG/2ML IJ SOLN
1.0000 mg | INTRAMUSCULAR | Status: DC | PRN
  Administered 2018-02-15: 2 mg via INTRAVENOUS

## 2018-02-15 MED ORDER — PROPOFOL 10 MG/ML IV BOLUS
INTRAVENOUS | Status: DC | PRN
  Administered 2018-02-15: 180 mg via INTRAVENOUS

## 2018-02-15 MED ORDER — LIDOCAINE 2% (20 MG/ML) 5 ML SYRINGE
INTRAMUSCULAR | Status: DC | PRN
  Administered 2018-02-15: 100 mg via INTRAVENOUS

## 2018-02-15 MED ORDER — DEXAMETHASONE SODIUM PHOSPHATE 10 MG/ML IJ SOLN
INTRAMUSCULAR | Status: AC
  Filled 2018-02-15: qty 1

## 2018-02-15 MED ORDER — ACETAMINOPHEN 500 MG PO TABS: 1000.0000 mg | ORAL_TABLET | ORAL | Status: DC

## 2018-02-15 MED ORDER — HYDROMORPHONE HCL 1 MG/ML IJ SOLN
INTRAMUSCULAR | Status: AC
  Filled 2018-02-15: qty 0.5

## 2018-02-15 MED ORDER — MEPERIDINE HCL 25 MG/ML IJ SOLN: 6.2500 mg | INTRAMUSCULAR | Status: DC | PRN

## 2018-02-15 MED ORDER — HYDROCODONE-ACETAMINOPHEN 7.5-325 MG PO TABS: 1.0000 | ORAL_TABLET | Freq: Once | ORAL | Status: DC | PRN

## 2018-02-15 MED ORDER — ACETAMINOPHEN 500 MG PO TABS
ORAL_TABLET | ORAL | Status: AC
  Filled 2018-02-15: qty 2

## 2018-02-15 SURGICAL SUPPLY — 41 items
APPLIER CLIP 9.375 MED OPEN (MISCELLANEOUS)
BLADE SURG 15 STRL LF DISP TIS (BLADE) ×1 IMPLANT
BLADE SURG 15 STRL SS (BLADE) ×2
CANISTER SUCT 1200ML W/VALVE (MISCELLANEOUS) IMPLANT
CHLORAPREP W/TINT 26ML (MISCELLANEOUS) ×3 IMPLANT
CLIP APPLIE 9.375 MED OPEN (MISCELLANEOUS) IMPLANT
COVER BACK TABLE 60X90IN (DRAPES) ×3 IMPLANT
COVER MAYO STAND STRL (DRAPES) ×3 IMPLANT
COVER PROBE W GEL 5X96 (DRAPES) ×3 IMPLANT
DECANTER SPIKE VIAL GLASS SM (MISCELLANEOUS) IMPLANT
DERMABOND ADVANCED (GAUZE/BANDAGES/DRESSINGS) ×2
DERMABOND ADVANCED .7 DNX12 (GAUZE/BANDAGES/DRESSINGS) ×1 IMPLANT
DRAPE LAPAROTOMY 100X72 PEDS (DRAPES) ×3 IMPLANT
DRAPE UTILITY XL STRL (DRAPES) ×3 IMPLANT
ELECT COATED BLADE 2.86 ST (ELECTRODE) ×3 IMPLANT
ELECT REM PT RETURN 9FT ADLT (ELECTROSURGICAL) ×3
ELECTRODE REM PT RTRN 9FT ADLT (ELECTROSURGICAL) ×1 IMPLANT
GLOVE BIO SURGEON STRL SZ 6.5 (GLOVE) ×2 IMPLANT
GLOVE BIO SURGEONS STRL SZ 6.5 (GLOVE) ×1
GLOVE BIOGEL PI IND STRL 8 (GLOVE) ×1 IMPLANT
GLOVE BIOGEL PI INDICATOR 8 (GLOVE) ×2
GLOVE ECLIPSE 8.0 STRL XLNG CF (GLOVE) ×3 IMPLANT
GLOVE EXAM NITRILE MD LF STRL (GLOVE) ×3 IMPLANT
GOWN STRL REUS W/ TWL LRG LVL3 (GOWN DISPOSABLE) ×2 IMPLANT
GOWN STRL REUS W/TWL LRG LVL3 (GOWN DISPOSABLE) ×4
NEEDLE HYPO 25X1 1.5 SAFETY (NEEDLE) ×3 IMPLANT
NS IRRIG 1000ML POUR BTL (IV SOLUTION) IMPLANT
PACK BASIN DAY SURGERY FS (CUSTOM PROCEDURE TRAY) ×3 IMPLANT
PENCIL BUTTON HOLSTER BLD 10FT (ELECTRODE) ×3 IMPLANT
SLEEVE SCD COMPRESS KNEE MED (MISCELLANEOUS) ×3 IMPLANT
SPONGE LAP 4X18 RFD (DISPOSABLE) ×3 IMPLANT
SUT CHROMIC 3 0 SH 27 (SUTURE) IMPLANT
SUT MON AB 4-0 PC3 18 (SUTURE) ×3 IMPLANT
SUT VICRYL 3-0 CR8 SH (SUTURE) IMPLANT
SUT VICRYL 4-0 PS2 18IN ABS (SUTURE) ×3 IMPLANT
SYR CONTROL 10ML LL (SYRINGE) ×3 IMPLANT
TOWEL GREEN STERILE FF (TOWEL DISPOSABLE) ×3 IMPLANT
TOWEL OR NON WOVEN STRL DISP B (DISPOSABLE) IMPLANT
TUBE CONNECTING 20'X1/4 (TUBING)
TUBE CONNECTING 20X1/4 (TUBING) IMPLANT
YANKAUER SUCT BULB TIP NO VENT (SUCTIONS) IMPLANT

## 2018-02-15 NOTE — Interval H&P Note (Signed)
History and Physical Interval Note:  02/15/2018 8:17 AM  Adrienne [name]  has presented today for surgery, with the diagnosis of CERVICAL ADENOPATHY  The various methods of treatment have been discussed with the patient and family. After consideration of risks, benefits and other options for treatment, the patient has consented to  Procedure(s): RIGHT CERVICAL LYMPH NODE BIOPSY EXCISIONAL (Right) as a surgical intervention .  The patient's history has been reviewed, patient examined, no change in status, stable for surgery.  I have reviewed the patient's chart and labs.  Questions were answered to the patient's satisfaction.     [place]

## 2018-02-15 NOTE — Discharge Instructions (Signed)
GENERAL SURGERY: POST OP INSTRUCTIONS  ######################################################################  EAT Gradually transition to a high fiber diet with a fiber supplement over the next few weeks after discharge.  Start with a pureed / full liquid diet (see below)  WALK Walk an hour a day.  Control your pain to do that.    CONTROL PAIN Control pain so that you can walk, sleep, tolerate sneezing/coughing, go up/down stairs.  HAVE A BOWEL MOVEMENT DAILY Keep your bowels regular to avoid problems.  OK to try a laxative to override constipation.  OK to use an antidairrheal to slow down diarrhea.  Call if not better after 2 tries  CALL IF YOU HAVE PROBLEMS/CONCERNS Call if you are still struggling despite following these instructions. Call if you have concerns not answered by these instructions  ######################################################################    1. DIET: Follow a light bland diet the first 24 hours after arrival home, such as soup, liquids, crackers, etc.  Be sure to include lots of fluids daily.  Avoid fast food or heavy meals as your are more likely to get nauseated.   2. Take your usually prescribed home medications unless otherwise directed. 3. PAIN CONTROL: a. Pain is best controlled by a usual combination of three different methods TOGETHER: i. Ice/Heat ii. Over the counter pain medication iii. Prescription pain medication b. Most patients will experience some swelling and bruising around the incisions.  Ice packs or heating pads (30-60 minutes up to 6 times a day) will help. Use ice for the first few days to help decrease swelling and bruising, then switch to heat to help relax tight/sore spots and speed recovery.  Some people prefer to use ice alone, heat alone, alternating between ice & heat.  Experiment to what works for you.  Swelling and bruising can take several weeks to resolve.   c. It is helpful to take an over-the-counter pain medication  regularly for the first few weeks.  Choose one of the following that works best for you: i. Naproxen (Aleve, etc)  Two 220mg  tabs twice a day ii. Ibuprofen (Advil, etc) Three 200mg  tabs four times a day (every meal & bedtime) iii. Acetaminophen (Tylenol, etc) 500-650mg  four times a day (every meal & bedtime) NO TYLENOL UNTIL 2:00PM if needed d. A  prescription for pain medication (such as oxycodone, hydrocodone, etc) should be given to you upon discharge.  Take your pain medication as prescribed.  i. If you are having problems/concerns with the prescription medicine (does not control pain, nausea, vomiting, rash, itching, etc), please call us [phone] to see if we need to switch you to a different pain medicine that will work better for you and/or control your side effect better. ii. If you need a refill on your pain medication, please contact your pharmacy.  They will contact our office to request authorization. Prescriptions will not be filled after 5 pm or on week-ends. 4. Avoid getting constipated.  Between the surgery and the pain medications, it is common to experience some constipation.  Increasing fluid intake and taking a fiber supplement (such as Metamucil, Citrucel, FiberCon, MiraLax, etc) 1-2 times a day regularly will usually help prevent this problem from occurring.  A mild laxative (prune juice, Milk of Magnesia, MiraLax, etc) should be taken according to package directions if there are no bowel movements after 48 hours.   5. Wash / shower every day.  You may shower over the dressings as they are waterproof.  Continue to shower over incision(s) after the dressing is  off. 6. Remove your waterproof bandages 5 days after surgery.  You may leave the incision open to air.  You may have skin tapes (Steri Strips) covering the incision(s).  Leave them on until one week, then remove.  You may replace a dressing/Band-Aid to cover the incision for comfort if you wish.      7. ACTIVITIES as  tolerated:   a. You may resume regular (light) daily activities beginning the next day--such as daily self-care, walking, climbing stairs--gradually increasing activities as tolerated.  If you can walk 30 minutes without difficulty, it is safe to try more intense activity such as jogging, treadmill, bicycling, low-impact aerobics, swimming, etc. b. Save the most intensive and strenuous activity for last such as sit-ups, heavy lifting, contact sports, etc  Refrain from any heavy lifting or straining until you are off narcotics for pain control.   c. DO NOT PUSH THROUGH PAIN.  Let pain be your guide: If it hurts to do something, don't do it.  Pain is your body warning you to avoid that activity for another week until the pain goes down. d. You may drive when you are no longer taking prescription pain medication, you can comfortably wear a seatbelt, and you can safely maneuver your car and apply brakes. e. Dennis Bast may have sexual intercourse when it is comfortable.  8. FOLLOW UP in our office a. Please call [facility] at [phone] to set up an appointment to see your surgeon in the office for a follow-up appointment approximately 2-3 weeks after your surgery. b. Make sure that you call for this appointment the day you arrive home to insure a convenient appointment time. 9. IF YOU HAVE DISABILITY OR FAMILY LEAVE FORMS, BRING THEM TO THE OFFICE FOR PROCESSING.  DO NOT GIVE THEM TO YOUR DOCTOR.   WHEN TO CALL us [phone]: 1. Poor pain control 2. Reactions / problems with new medications (rash/itching, nausea, etc)  3. Fever over 101.5 F (38.5 C) 4. Worsening swelling or bruising 5. Continued bleeding from incision. 6. Increased pain, redness, or drainage from the incision 7. Difficulty breathing / swallowing   The clinic staff is available to answer your questions during regular business hours (8:30am-5pm).  Please dont hesitate to call and ask to speak to one of our nurses for clinical concerns.    If you have a medical emergency, go to the nearest emergency room or call 911.  A surgeon from [facility] is always on call at the [facility], [place], [place], [place], [place]  [address] ? MAIN: [phone] ? TOLL FREE: [phone] ?  FAX [phone] [email or web address]      Post Anesthesia Home Care Instructions  Activity: Get plenty of rest for the remainder of the day. A responsible individual must stay with you for 24 hours following the procedure.  For the next 24 hours, DO NOT: -Drive a car -Paediatric nurse -Drink alcoholic beverages -Take any medication unless instructed by your physician -Make any legal decisions or sign important papers.  Meals: Start with liquid foods such as gelatin or soup. Progress to regular foods as tolerated. Avoid greasy, spicy, heavy foods. If nausea and/or vomiting occur, drink only clear liquids until the nausea and/or vomiting subsides. Call your physician if vomiting continues.  Special Instructions/Symptoms: Your throat may feel dry or sore from the anesthesia or the breathing tube placed in your throat during surgery. If this causes discomfort, gargle with warm salt  water. The discomfort should disappear within 24 hours.  If you had a scopolamine patch placed behind your ear for the management of post- operative nausea and/or vomiting:  1. The medication in the patch is effective for 72 hours, after which it should be removed.  Wrap patch in a tissue and discard in the trash. Wash hands thoroughly with soap and water. 2. You may remove the patch earlier than 72 hours if you experience unpleasant side effects which may include dry mouth, dizziness or visual disturbances. 3. Avoid touching the patch. Wash your hands with soap and water after contact with the patch.

## 2018-02-15 NOTE — Op Note (Signed)
Preoperative diagnosis: Right cervical lymphadenopathy  Postoperative diagnosis same  Procedure: Right cervical lymph node biopsy deep level 4  Surgeon: Erroll Luna, MD  Anesthesia: LMA with local  EBL: Minimal  Specimen: Right cervical lymph node to pathology  Indications for procedure: The patient presents with diffuse cervical and mediastinal lymphadenopathy picked up on CT scanning.  She will core biopsy which showed atypical lymphoid proliferation but flow cytometry did not reveal specific abnormality.  She presents for a deep right cervical lymph node biopsy for diagnosis of underlying lymphadenopathy.  Risk of bleeding, infection, nerve injury, blood vessel injury, mediastinal injury, anesthesia risk and other potential complications discussed with the patient.  She agreed to proceed.  Description of procedure: The patient was met in the holding area.  The right cervical node was in level 4 was marked.  Questions answered.  She was taken back to the operating room placed supine.  After induction of LMA anesthesia right neck was prepped and draped in sterile fashion timeout was done and appropriate antibiotics were administered.  Transverse incision was made over the lymph node site which is in level 4.  The platysma muscle was separated.  This was just posterior to the anterior head of her sternocleidomastoid muscle.  I dissected down in this region carefully take great care to preserve the structures adjacent to the lymph node.  The node was removed in its entirety.  There is significant lymphadenopathy which extended below this under the right clavicle toward the mediastinum as was seen on CT scanning as well.  Adjacent structures were preserved.  Hemostasis achieved.  Wound closed with 3-0 Vicryl and 4-0 Monocryl.  Dermabond applied.  All final counts found to be correct.  Patient awoke extubated taken to recovery in satisfactory condition.

## 2018-02-15 NOTE — Anesthesia Postprocedure Evaluation (Signed)
Anesthesia Post Note  Patient: Adrienne [name]  Procedure(s) Performed: RIGHT CERVICAL LYMPH NODE EXCISIONAL BIOPSY (Right Neck)     Patient location during evaluation: PACU Anesthesia Type: General Level of consciousness: awake and alert Pain management: pain level controlled Vital Signs Assessment: post-procedure vital signs reviewed and stable Respiratory status: spontaneous breathing, nonlabored ventilation, respiratory function stable and patient connected to nasal cannula oxygen Cardiovascular status: blood pressure returned to baseline and stable Postop Assessment: no apparent nausea or vomiting Anesthetic complications: no    Last Vitals:  Vitals:   02/15/18 1100 02/15/18 1127  BP: 107/61 115/60  Pulse: 82 63  Resp: 15 18  Temp:  36.6 C  SpO2: 100% 100%    Last Pain:  Vitals:   02/15/18 1127  TempSrc:   PainSc: 0-No pain                 Barnet Glasgow

## 2018-02-15 NOTE — Anesthesia Procedure Notes (Signed)
Procedure Name: LMA Insertion Date/Time: 02/15/2018 9:37 AM Performed by: Maryella Shivers, CRNA Pre-anesthesia Checklist: Patient identified, Emergency Drugs available, Suction available and Patient being monitored Patient Re-evaluated:Patient Re-evaluated prior to induction Oxygen Delivery Method: Circle system utilized Preoxygenation: Pre-oxygenation with 100% oxygen Induction Type: IV induction Ventilation: Mask ventilation without difficulty LMA: LMA inserted LMA Size: 4.0 Number of attempts: 1 Airway Equipment and Method: Bite block Placement Confirmation: positive ETCO2 Tube secured with: Tape Dental Injury: Teeth and Oropharynx as per pre-operative assessment

## 2018-02-15 NOTE — Transfer of Care (Signed)
Immediate Anesthesia Transfer of Care Note  Patient: Adrienne [name]  Procedure(s) Performed: RIGHT CERVICAL LYMPH NODE EXCISIONAL BIOPSY (Right Neck)  Patient Location: PACU  Anesthesia Type:General  Level of Consciousness: sedated  Airway & Oxygen Therapy: Patient Spontanous Breathing and Patient connected to face mask oxygen  Post-op Assessment: Report given to RN and Post -op Vital signs reviewed and stable  Post vital signs: Reviewed and stable  Last Vitals:  Vitals Value Taken Time  BP 88/48 02/15/2018 10:21 AM  Temp    Pulse 65 02/15/2018 10:23 AM  Resp 13 02/15/2018 10:23 AM  SpO2 100 % 02/15/2018 10:23 AM  Vitals shown include unvalidated device data.  Last Pain:  Vitals:   02/15/18 0715  TempSrc: Oral  PainSc: 0-No pain         Complications: No apparent anesthesia complications

## 2018-02-16 ENCOUNTER — Encounter (HOSPITAL_BASED_OUTPATIENT_CLINIC_OR_DEPARTMENT_OTHER): Payer: Self-pay | Admitting: Surgery

## 2018-02-21 ENCOUNTER — Telehealth: Payer: Self-pay | Admitting: Hematology

## 2018-02-21 DIAGNOSIS — C8190 Hodgkin lymphoma, unspecified, unspecified site: Secondary | ICD-10-CM

## 2018-02-21 HISTORY — DX: Hodgkin lymphoma, unspecified, unspecified site: C81.90

## 2018-02-21 NOTE — Telephone Encounter (Signed)
Spoke with patient re 7/29 new patient appointment at 11 am. Date per patient due to cannot take 7/25 due to family vacation. Patient given appointment date/time/location/phone number. Confirmed patient demographic/insurance information.  Informed Silvia, triage nurse at referring re appointment date/time and also re patient having questions about referral dx.

## 2018-02-22 ENCOUNTER — Telehealth: Payer: Self-pay | Admitting: Hematology and Oncology

## 2018-02-22 NOTE — Telephone Encounter (Signed)
New patient appointment moved from 7/29 to 7/19 with NG. Date/time per NG. Patient/family requested appointment with NG. Patient aware of new date/time.

## 2018-02-23 ENCOUNTER — Telehealth: Payer: Self-pay | Admitting: Hematology and Oncology

## 2018-02-23 ENCOUNTER — Inpatient Hospital Stay: Payer: 59 | Attending: Hematology and Oncology | Admitting: Hematology and Oncology

## 2018-02-23 ENCOUNTER — Encounter: Payer: Self-pay | Admitting: Hematology and Oncology

## 2018-02-23 VITALS — BP 111/67 | HR 57 | Temp 97.8°F | Resp 18 | Ht 69.0 in | Wt 181.5 lb

## 2018-02-23 DIAGNOSIS — C8170 Other classical Hodgkin lymphoma, unspecified site: Secondary | ICD-10-CM | POA: Insufficient documentation

## 2018-02-23 DIAGNOSIS — F411 Generalized anxiety disorder: Secondary | ICD-10-CM | POA: Diagnosis not present

## 2018-02-23 DIAGNOSIS — C8171 Other classical Hodgkin lymphoma, lymph nodes of head, face, and neck: Secondary | ICD-10-CM | POA: Insufficient documentation

## 2018-02-23 DIAGNOSIS — R5383 Other fatigue: Secondary | ICD-10-CM | POA: Insufficient documentation

## 2018-02-23 DIAGNOSIS — Z3162 Encounter for fertility preservation counseling: Secondary | ICD-10-CM | POA: Diagnosis not present

## 2018-02-23 DIAGNOSIS — Z87891 Personal history of nicotine dependence: Secondary | ICD-10-CM | POA: Insufficient documentation

## 2018-02-23 NOTE — Telephone Encounter (Signed)
Gave avs and calendar ° °

## 2018-02-23 NOTE — Progress Notes (Signed)
[place] CONSULT NOTE  Patient Care Team: [name], MD as PCP - General (Family Medicine)  ASSESSMENT & PLAN:  Classical Hodgkin lymphoma [facility]) I have extensive discussion with the patient and family members the importance of staging I recommend PET CT scan Due to her plans for vacation, I plan to order most of her tests to start on March 05, 2018 I will order port placement, pulmonary function tests, and baseline echocardiogram before we proceed with treatment I will schedule chemo education class as well I plan to see her back at the end of July to start treatment on March 08, 2018  Encounter for fertility preservation counseling prior to cancer therapy We have extensive discussion about plan for cryopreservation versus referral to fertility clinic versus Lupron Ultimately, the patient is interested to get Lupron injection in an attempt to preserve her fertility  Other fatigue She has symptoms of fatigue, weight loss, skin itching, regular night sweats, rash and dizziness, which are all related to her cancer I felt that she is unlikely able to return back to work until completion of chemotherapy I recommend consideration for application for disability in the short-term   Orders Placed This Encounter  Procedures  . NM PET Image Initial (PI) Skull Base To Thigh    Standing Status:   Future    Standing Expiration Date:   02/23/2019    Order Specific Question:   If indicated for the ordered procedure, I authorize the administration of a radiopharmaceutical per Radiology protocol    Answer:   Yes    Order Specific Question:   Is the patient pregnant?    Answer:   No    Order Specific Question:   Preferred imaging location?    Answer:   [facility]    Order Specific Question:   Radiology Contrast Protocol - do NOT remove file path    Answer:   [email or web address]  . ECHOCARDIOGRAM COMPLETE    Standing Status:   Future     Standing Expiration Date:   05/27/2019    Order Specific Question:   Where should this test be performed    Answer:   [place]    Order Specific Question:   Perflutren DEFINITY (image enhancing agent) should be administered unless hypersensitivity or allergy exist    Answer:   Administer Perflutren    Order Specific Question:   Expected Date:    Answer:   1 week  . Pulmonary Function Test    Standing Status:   Future    Standing Expiration Date:   02/24/2019    Order Specific Question:   Where should this test be performed?    Answer:   [facility]    Order Specific Question:   Full PFT: includes the following: basic spirometry, spirometry pre & post bronchodilator, diffusion capacity (DLCO), lung volumes    Answer:   FULL PFT Without spirometry post bronchodilator     CHIEF COMPLAINTS/PURPOSE OF CONSULTATION:  Newly diagnosed classical Hodgkin lymphoma, for further management  HISTORY OF PRESENTING ILLNESS:  Adrienne [name] 27 y.o. female is here because of recent diagnosis of lymphoma. Her mother and her husband are present. The patient works part-time as a Secondary school teacher.  She has 1 daughter who is almost 65 years old Approximately a month and a half ago, she started to notice constellation of symptoms with right chest discomfort, skin rash, tingling affecting the right hand, shortness of breath on minimal exertion, excessive fatigue, dizziness, skin  itching, frequent intermittent night sweats and new neck lump on the right side She subsequently saw her primary care doctor who has ordered ultrasound   Of the neck followed by CT scan followed by a core needle biopsy The biopsy was suspicious for lymphoma but excisional biopsy was recommended.  She was subsequently referred to see general surgery who had perform surgical biopsy leading to the diagnosis of classical Hodgkin lymphoma I have reviewed her chart and materials related to her cancer extensively and collaborated history with the  patient. Summary of oncologic history is as follows:   Classical Hodgkin lymphoma ([place])   01/22/2018 Imaging    CT scan of neck: Adenopathy throughout the right lateral neck, right supraclavicular fossa, and upper mediastinum. There is no specific imaging feature, lymphoma or atypical infection are the primary considerations and excisional biopsy should be considered if there is no diagnosis by history or labs.       01/31/2018 Pathology Results    Lymph node for lymphoma, right supraclavicular / right cervical - ATYPICAL LYMPHOID PROLIFERATION. - SEE COMMENT. Microscopic Comment Sections show a few very small needle core biopsy fragments of lymph nodal tissue displaying a polymorphous cellular proliferation of small lymphocytes, plasma cells, eosinophils and histiocytes in addition to variable numbers of large mononuclear and multilobated lymphoid appearing cells with variably prominent nucleoli. Flow cytometric was performed ([id number]) and failed to show any monoclonal B-cell population or abnormal T-cell phenotype. Immunohistochemical stains were performed, including CD15, CD20, CD3, CD30, LCA, and PAX-5 with appropriate controls. The large atypical lymphoid appearing cells are positive for CD15, CD30, CD20 and PAX-5 and negative for CD3 and LCA. The small lymphoid component in the background is mostly composed of T cells. The morphologic and phenotypic features are atypical and highly suspicious for involvement by a lymphoproliferative process, particularly classical Hodgkin lymphoma. Excisional biopsy is recommended.      01/31/2018 Procedure    Ultrasound-guided core biopsies of right cervical lymph nodes.      02/15/2018 Pathology Results    Lymph node for lymphoma, Right Cervical - CLASSICAL HODGKIN LYMPHOMA, NODULAR SCLEROSIS TYPE      03/07/2018 -  Chemotherapy    The patient had DOXOrubicin (ADRIAMYCIN) chemo injection 50 mg, 25 mg/m2, Intravenous,  Once, 0 of 6 cycles PALONOSETRON  HCL INJECTION 0.25 MG/5ML, 0.25 mg, Intravenous,  Once, 0 of 6 cycles bleomycin (BLEOCIN) 20 Units in sodium chloride 0.9 % 50 mL chemo infusion, 10 Units/m2, Intravenous,  Once, 0 of 6 cycles dacarbazine (DTIC) 750 mg in sodium chloride 0.9 % 250 mL chemo infusion, 375 mg/m2, Intravenous,  Once, 0 of 6 cycles vinBLAStine (VELBAN) 12 mg in sodium chloride 0.9 % 50 mL chemo infusion, 6 mg/m2, Intravenous, Once, 0 of 6 cycles FOSAPREPITANT 150MG  + DEXAMETHASONE INFUSION [facility], , Intravenous,  Once, 0 of 6 cycles  for chemotherapy treatment.       She tolerated recent surgery well. She complained of excessive fatigue.  She denies other lymphadenopathy She has 30 pounds of intentional weight loss since last year She has Mirena IUD but has intermittent vaginal spotting.  She desire to have more children in the future She has planned vacation leaving tomorrow and will return back to [facility] on March 02, 2018.  She would like to get her treatment started soon.  MEDICAL HISTORY:  Past Medical History:  Diagnosis Date  . Anxiety 2019  . Depression 2016  . Dyspnea 01/2009   with sternal area discomfort, Pt questions anxiety  . Frequent  UTI     SURGICAL HISTORY: Past Surgical History:  Procedure Laterality Date  . INDUCED ABORTION    . LYMPH NODE BIOPSY Right 02/15/2018   Procedure: RIGHT CERVICAL LYMPH NODE EXCISIONAL BIOPSY;  Surgeon: Erroll Luna, MD;  Location: [facility];  Service: General;  Laterality: Right;  . MOUTH SURGERY    . WRIST SURGERY      SOCIAL HISTORY: Social History   Socioeconomic History  . Marital status: Married    Spouse name: Astrid Divine  . Number of children: 1  . Years of education: Not on file  . Highest education level: Not on file  Occupational History  . Occupation: Geographical information systems officer  . Financial resource strain: Not on file  . Food insecurity:    Worry: Not on file    Inability: Not on file  . Transportation needs:    Medical:  Not on file    Non-medical: Not on file  Tobacco Use  . Smoking status: Former Research scientist (life sciences)  . Smokeless tobacco: Never Used  . Tobacco comment: 2015  Substance and Sexual Activity  . Alcohol use: Yes    Alcohol/week: 0.0 oz    Comment: socially   . Drug use: No  . Sexual activity: Yes    Birth control/protection: None  Lifestyle  . Physical activity:    Days per week: Not on file    Minutes per session: Not on file  . Stress: Not on file  Relationships  . Social connections:    Talks on phone: Not on file    Gets together: Not on file    Attends religious service: Not on file    Active member of club or organization: Not on file    Attends meetings of clubs or organizations: Not on file    Relationship status: Not on file  . Intimate partner violence:    Fear of current or ex partner: Not on file    Emotionally abused: Not on file    Physically abused: Not on file    Forced sexual activity: Not on file  Other Topics Concern  . Not on file  Social History Narrative  . Not on file    FAMILY HISTORY: Family History  Problem Relation Age of Onset  . Cancer Paternal Grandmother        lung  . Heart disease Paternal Grandfather        died from heart attack  . Alcohol abuse Neg Hx   . Arthritis Neg Hx   . Asthma Neg Hx   . Birth defects Neg Hx   . COPD Neg Hx   . Depression Neg Hx   . Diabetes Neg Hx   . Drug abuse Neg Hx   . Early death Neg Hx   . Hearing loss Neg Hx   . Hyperlipidemia Neg Hx   . Hypertension Neg Hx   . Kidney disease Neg Hx   . Learning disabilities Neg Hx   . Mental illness Neg Hx   . Mental retardation Neg Hx   . Miscarriages / Stillbirths Neg Hx   . Stroke Neg Hx   . Vision loss Neg Hx   . Varicose Veins Neg Hx     ALLERGIES:  has No Known Allergies.  MEDICATIONS:  Current Outpatient Medications  Medication Sig Dispense Refill  . ALPRAZolam (XANAX) 0.5 MG tablet Take 0.5 mg by mouth 2 (two) times daily as needed.    . hydrOXYzine  (ATARAX/VISTARIL) 25 MG tablet  Take 25 mg by mouth 3 (three) times daily as needed.    Marland Kitchen ibuprofen (ADVIL,MOTRIN) 800 MG tablet Take 1 tablet (800 mg total) by mouth every 8 (eight) hours as needed. 30 tablet 0   No current facility-administered medications for this visit.     REVIEW OF SYSTEMS:   Eyes: Denies blurriness of vision, double vision or watery eyes Ears, nose, mouth, throat, and face: Denies mucositis or sore throat Respiratory: Denies cough, dyspnea or wheezes Gastrointestinal:  Denies nausea, heartburn or change in bowel habits Behavioral/Psych: Mood is stable, no new changes  All other systems were reviewed with the patient and are negative.  PHYSICAL EXAMINATION: ECOG PERFORMANCE STATUS: 1 - Symptomatic but completely ambulatory  Vitals:   02/23/18 0802  BP: 111/67  Pulse: (!) 57  Resp: 18  Temp: 97.8 F (36.6 C)  SpO2: 100%   Filed Weights   02/23/18 0802  Weight: 181 lb 8 oz (82.3 kg)    GENERAL:alert, no distress and comfortable SKIN: She has a faint rash on her sternum area EYES: normal, conjunctiva are pink and non-injected, sclera clear OROPHARYNX:no exudate, no erythema and lips, buccal mucosa, and tongue normal  NECK: supple, thyroid normal size, non-tender, without nodularity LYMPH: She has palpable lymphadenopathy on the right side of her neck.  She has fullness in the right axilla and may be palpable lymph node in the right groin LUNGS: clear to auscultation and percussion with normal breathing effort HEART: regular rate & rhythm and no murmurs and no lower extremity edema ABDOMEN:abdomen soft, non-tender and normal bowel sounds Musculoskeletal:no cyanosis of digits and no clubbing  PSYCH: alert & oriented x 3 with fluent speech NEURO: no focal motor/sensory deficits  LABORATORY DATA:  I have reviewed the data as listed Lab Results  Component Value Date   WBC 7.4 01/31/2018   HGB 12.9 01/31/2018   HCT 39.0 01/31/2018   MCV 88.0 01/31/2018    PLT 324 01/31/2018   Recent Labs    01/31/18 1123  NA 142  K 4.1  CL 107  CO2 27  GLUCOSE 87  BUN 12  CREATININE 0.72  CALCIUM 9.8  GFRNONAA >60  GFRAA >60    RADIOGRAPHIC STUDIES: I have personally reviewed the radiological images as listed and agreed with the findings in the report. [place] Core Biopsy (lymph Nodes)  Result Date: 01/31/2018 INDICATION: 27 year old with mediastinal and right cervical lymphadenopathy. Tissue diagnosis is needed. EXAM: ULTRASOUND-GUIDED RIGHT CERVICAL LYMPH NODE BIOPSY MEDICATIONS: None. ANESTHESIA/SEDATION: Moderate (conscious) sedation was employed during this procedure. A total of Versed 4 mg and Fentanyl 150 mcg was administered intravenously. Moderate Sedation Time: 23 minutes. The patient's level of consciousness and vital signs were monitored continuously by radiology nursing throughout the procedure under my direct supervision. FLUOROSCOPY TIME:  None COMPLICATIONS: None immediate. PROCEDURE: Informed written consent was obtained from the patient after a thorough discussion of the procedural risks, benefits and alternatives. All questions were addressed. A timeout was performed prior to the initiation of the procedure. Ultrasound demonstrated numerous lymph nodes throughout the right lower neck. Many of these lymph nodes are clumped together. Right side of the neck was prepped with chlorhexidine and sterile field was created. Skin was anesthetized with 1% lidocaine. 18 gauge core device was directed into one of the prominent lymph nodes and core biopsies were obtained with ultrasound guidance. Core samples were obtained of the prominent lymph node and the adjacent lymph node conglomeration. Total of 7 core biopsies were obtained. Specimens placed  in saline. Bandage placed over the puncture site. FINDINGS: Numerous heterogeneous and hypoechoic lymph nodes in the right lower neck. Findings are similar to the recent CT findings. Biopsy needle was confirmed  within the enlarged lymph nodes. No significant bleeding or hematoma formation following the core biopsies. IMPRESSION: Ultrasound-guided core biopsies of right cervical lymph nodes. Electronically Signed   By: Markus Daft M.D.   On: 01/31/2018 16:56    I spent 55 minutes counseling the patient face to face. The total time spent in the appointment was 60 minutes and more than 50% was on counseling.  All questions were answered. The patient knows to call the clinic with any problems, questions or concerns.  Heath Lark, MD 02/23/2018 1:47 PM

## 2018-02-23 NOTE — Progress Notes (Signed)
START ON PATHWAY REGIMEN - Lymphoma and CLL     A cycle is every 28 days:     Doxorubicin      Dacarbazine      Vinblastine      Bleomycin   **Always confirm dose/schedule in your pharmacy ordering system**  Patient Characteristics: Classical Hodgkin Lymphoma, First Line, Stage III / IV Disease Type: Not Applicable Disease Type: Not Applicable Disease Type: Classical Hodgkin Lymphoma Line of therapy: First Line [place] Stage: IVB Intent of Therapy: Curative Intent, Discussed with Patient

## 2018-02-23 NOTE — Assessment & Plan Note (Signed)
We have extensive discussion about plan for cryopreservation versus referral to fertility clinic versus Lupron Ultimately, the patient is interested to get Lupron injection in an attempt to preserve her fertility

## 2018-02-23 NOTE — Assessment & Plan Note (Signed)
She has symptoms of fatigue, weight loss, skin itching, regular night sweats, rash and dizziness, which are all related to her cancer I felt that she is unlikely able to return back to work until completion of chemotherapy I recommend consideration for application for disability in the short-term

## 2018-02-23 NOTE — Assessment & Plan Note (Signed)
I have extensive discussion with the patient and family members the importance of staging I recommend PET CT scan Due to her plans for vacation, I plan to order most of her tests to start on March 05, 2018 I will order port placement, pulmonary function tests, and baseline echocardiogram before we proceed with treatment I will schedule chemo education class as well I plan to see her back at the end of July to start treatment on March 08, 2018

## 2018-02-26 ENCOUNTER — Telehealth: Payer: Self-pay | Admitting: *Deleted

## 2018-02-26 NOTE — Telephone Encounter (Signed)
Notified of appts:   7/29 0900 report to [facility] Admitting for PFT  7/29 1130: [facility] for PET (NPO after MN)  7/29 2:00: Admitting for ECHO.

## 2018-02-27 ENCOUNTER — Encounter (HOSPITAL_BASED_OUTPATIENT_CLINIC_OR_DEPARTMENT_OTHER): Payer: Self-pay | Admitting: *Deleted

## 2018-02-27 ENCOUNTER — Other Ambulatory Visit: Payer: Self-pay

## 2018-03-01 ENCOUNTER — Other Ambulatory Visit: Payer: Self-pay | Admitting: Pharmacist

## 2018-03-01 DIAGNOSIS — C8170 Other classical Hodgkin lymphoma, unspecified site: Secondary | ICD-10-CM

## 2018-03-02 ENCOUNTER — Other Ambulatory Visit: Payer: Self-pay | Admitting: Hematology and Oncology

## 2018-03-02 DIAGNOSIS — C8170 Other classical Hodgkin lymphoma, unspecified site: Secondary | ICD-10-CM

## 2018-03-05 ENCOUNTER — Encounter (HOSPITAL_COMMUNITY)
Admission: RE | Admit: 2018-03-05 | Discharge: 2018-03-05 | Disposition: A | Discharge status: Home / Self Care | Payer: 59 | Source: Ambulatory Visit | Attending: Hematology and Oncology | Admitting: Hematology and Oncology

## 2018-03-05 ENCOUNTER — Ambulatory Visit (HOSPITAL_COMMUNITY)
Admission: RE | Admit: 2018-03-05 | Discharge: 2018-03-05 | Disposition: A | Discharge status: Home / Self Care | Payer: 59 | Source: Ambulatory Visit | Attending: Hematology and Oncology | Admitting: Hematology and Oncology

## 2018-03-05 ENCOUNTER — Ambulatory Visit: Payer: PRIVATE HEALTH INSURANCE | Admitting: Hematology

## 2018-03-05 ENCOUNTER — Ambulatory Visit (HOSPITAL_BASED_OUTPATIENT_CLINIC_OR_DEPARTMENT_OTHER)
Admission: RE | Admit: 2018-03-05 | Discharge: 2018-03-05 | Disposition: A | Discharge status: Home / Self Care | Payer: 59 | Source: Ambulatory Visit | Attending: Hematology and Oncology | Admitting: Hematology and Oncology

## 2018-03-05 ENCOUNTER — Ambulatory Visit: Payer: Self-pay | Admitting: Surgery

## 2018-03-05 ENCOUNTER — Other Ambulatory Visit: Payer: Self-pay | Admitting: Hematology and Oncology

## 2018-03-05 DIAGNOSIS — I081 Rheumatic disorders of both mitral and tricuspid valves: Secondary | ICD-10-CM | POA: Diagnosis not present

## 2018-03-05 DIAGNOSIS — C8170 Other classical Hodgkin lymphoma, unspecified site: Secondary | ICD-10-CM

## 2018-03-05 DIAGNOSIS — R5383 Other fatigue: Secondary | ICD-10-CM

## 2018-03-05 DIAGNOSIS — R599 Enlarged lymph nodes, unspecified: Secondary | ICD-10-CM | POA: Insufficient documentation

## 2018-03-05 LAB — GLUCOSE, CAPILLARY: Glucose-Capillary: 86 mg/dL (ref 70–99)

## 2018-03-05 LAB — PULMONARY FUNCTION TEST
DL/VA % pred: 83 %
DL/VA: 4.47 ml/min/mmHg/L
DLCO UNC % PRED: 88 %
DLCO unc: 27.39 ml/min/mmHg
FEF 25-75 Pre: 3.27 L/sec
FEF2575-%PRED-PRE: 83 %
FEV1-%PRED-PRE: 100 %
FEV1-PRE: 3.78 L
FEV1FVC-%PRED-PRE: 88 %
FEV6-%Pred-Pre: 113 %
FEV6-Pre: 4.99 L
FEV6FVC-%Pred-Pre: 101 %
FVC-%Pred-Pre: 112 %
FVC-Pre: 4.99 L
PRE FEV1/FVC RATIO: 76 %
PRE FEV6/FVC RATIO: 100 %
RV % PRED: 109 %
RV: 1.67 L
TLC % pred: 114 %
TLC: 6.64 L

## 2018-03-05 MED ORDER — FLUDEOXYGLUCOSE F - 18 (FDG) INJECTION
8.5200 | Freq: Once | INTRAVENOUS | Status: AC | PRN
  Administered 2018-03-05: 8.52 via INTRAVENOUS

## 2018-03-05 NOTE — H&P (Signed)
Adrienne [name] is an 27 y.o. female.   Chief Complaint: poor venous access HPI: pt presents for port placement for chemotherapy Hx of Hodgkins lymphoma  Past Medical History:  Diagnosis Date  . Anxiety 2019  . Depression 2016  . Dyspnea 01/2009   with sternal area discomfort, Pt questions anxiety  . Frequent UTI     Past Surgical History:  Procedure Laterality Date  . INDUCED ABORTION    . LYMPH NODE BIOPSY Right 02/15/2018   Procedure: RIGHT CERVICAL LYMPH NODE EXCISIONAL BIOPSY;  Surgeon: Erroll Luna, MD;  Location: [facility];  Service: General;  Laterality: Right;  . MOUTH SURGERY    . WRIST SURGERY      Family History  Problem Relation Age of Onset  . Cancer Paternal Grandmother        lung  . Heart disease Paternal Grandfather        died from heart attack  . Alcohol abuse Neg Hx   . Arthritis Neg Hx   . Asthma Neg Hx   . Birth defects Neg Hx   . COPD Neg Hx   . Depression Neg Hx   . Diabetes Neg Hx   . Drug abuse Neg Hx   . Early death Neg Hx   . Hearing loss Neg Hx   . Hyperlipidemia Neg Hx   . Hypertension Neg Hx   . Kidney disease Neg Hx   . Learning disabilities Neg Hx   . Mental illness Neg Hx   . Mental retardation Neg Hx   . Miscarriages / Stillbirths Neg Hx   . Stroke Neg Hx   . Vision loss Neg Hx   . Varicose Veins Neg Hx    Social History:  reports that she has quit smoking. She has never used smokeless tobacco. She reports that she drinks alcohol. She reports that she does not use drugs.  Allergies: No Known Allergies   (Not in a hospital admission)  No results found for this or any previous visit (from the past 48 hour(s)). No results found.  Review of Systems  Constitutional: Positive for malaise/fatigue. Negative for weight loss.  HENT: Negative.   Eyes: Negative.   Respiratory: Negative.   Cardiovascular: Negative.   Gastrointestinal: Negative.   Skin: Negative.     There were no vitals taken for this  visit. Physical Exam  Constitutional: She appears well-developed and well-nourished.  Neck:  Small incision right neck Lymphadenopathy  noted   Cardiovascular: Normal rate.  Respiratory: Effort normal.  Neurological: She is alert.  Skin: Skin is warm.     Assessment/Plan Poor venous access Port placement for chemotherapy Risk of bleeding infection  Collapse lung organ injury death more surgery   Turner Daniels, MD 03/05/2018, 7:24 AM

## 2018-03-05 NOTE — Progress Notes (Signed)
  Echocardiogram 2D Echocardiogram has been performed.  Adrienne [name] 03/05/2018, 2:52 PM

## 2018-03-05 NOTE — H&P (View-Only) (Signed)
Adrienne [name] is an 27 y.o. female.   Chief Complaint: poor venous access HPI: pt presents for port placement for chemotherapy Hx of Hodgkins lymphoma  Past Medical History:  Diagnosis Date  . Anxiety 2019  . Depression 2016  . Dyspnea 01/2009   with sternal area discomfort, Pt questions anxiety  . Frequent UTI     Past Surgical History:  Procedure Laterality Date  . INDUCED ABORTION    . LYMPH NODE BIOPSY Right 02/15/2018   Procedure: RIGHT CERVICAL LYMPH NODE EXCISIONAL BIOPSY;  Surgeon: Erroll Luna, MD;  Location: [facility];  Service: General;  Laterality: Right;  . MOUTH SURGERY    . WRIST SURGERY      Family History  Problem Relation Age of Onset  . Cancer Paternal Grandmother        lung  . Heart disease Paternal Grandfather        died from heart attack  . Alcohol abuse Neg Hx   . Arthritis Neg Hx   . Asthma Neg Hx   . Birth defects Neg Hx   . COPD Neg Hx   . Depression Neg Hx   . Diabetes Neg Hx   . Drug abuse Neg Hx   . Early death Neg Hx   . Hearing loss Neg Hx   . Hyperlipidemia Neg Hx   . Hypertension Neg Hx   . Kidney disease Neg Hx   . Learning disabilities Neg Hx   . Mental illness Neg Hx   . Mental retardation Neg Hx   . Miscarriages / Stillbirths Neg Hx   . Stroke Neg Hx   . Vision loss Neg Hx   . Varicose Veins Neg Hx    Social History:  reports that she has quit smoking. She has never used smokeless tobacco. She reports that she drinks alcohol. She reports that she does not use drugs.  Allergies: No Known Allergies   (Not in a hospital admission)  No results found for this or any previous visit (from the past 48 hour(s)). No results found.  Review of Systems  Constitutional: Positive for malaise/fatigue. Negative for weight loss.  HENT: Negative.   Eyes: Negative.   Respiratory: Negative.   Cardiovascular: Negative.   Gastrointestinal: Negative.   Skin: Negative.     There were no vitals taken for this  visit. Physical Exam  Constitutional: She appears well-developed and well-nourished.  Neck:  Small incision right neck Lymphadenopathy  noted   Cardiovascular: Normal rate.  Respiratory: Effort normal.  Neurological: She is alert.  Skin: Skin is warm.     Assessment/Plan Poor venous access Port placement for chemotherapy Risk of bleeding infection  Collapse lung organ injury death more surgery   Adrienne Daniels, MD 03/05/2018, 7:24 AM

## 2018-03-06 ENCOUNTER — Telehealth: Payer: Self-pay | Admitting: *Deleted

## 2018-03-06 ENCOUNTER — Encounter: Payer: Self-pay | Admitting: Hematology and Oncology

## 2018-03-06 ENCOUNTER — Inpatient Hospital Stay: Payer: 59

## 2018-03-06 ENCOUNTER — Inpatient Hospital Stay (HOSPITAL_BASED_OUTPATIENT_CLINIC_OR_DEPARTMENT_OTHER): Payer: 59 | Admitting: Hematology and Oncology

## 2018-03-06 VITALS — BP 128/83 | HR 77 | Temp 97.5°F | Resp 18 | Ht 69.0 in | Wt 179.5 lb

## 2018-03-06 DIAGNOSIS — C8170 Other classical Hodgkin lymphoma, unspecified site: Secondary | ICD-10-CM

## 2018-03-06 DIAGNOSIS — C8171 Other classical Hodgkin lymphoma, lymph nodes of head, face, and neck: Secondary | ICD-10-CM | POA: Diagnosis not present

## 2018-03-06 DIAGNOSIS — R5383 Other fatigue: Secondary | ICD-10-CM

## 2018-03-06 DIAGNOSIS — Z3162 Encounter for fertility preservation counseling: Secondary | ICD-10-CM | POA: Diagnosis not present

## 2018-03-06 LAB — CBC WITH DIFFERENTIAL/PLATELET
Basophils Absolute: 0.1 10*3/uL (ref 0.0–0.1)
Basophils Relative: 1 %
EOS ABS: 0.7 10*3/uL — AB (ref 0.0–0.5)
EOS PCT: 7 %
HCT: 38.4 % (ref 34.8–46.6)
HEMOGLOBIN: 12.3 g/dL (ref 11.6–15.9)
LYMPHS PCT: 15 %
Lymphs Abs: 1.7 10*3/uL (ref 0.9–3.3)
MCH: 28 pg (ref 25.1–34.0)
MCHC: 32 g/dL (ref 31.5–36.0)
MCV: 87.5 fL (ref 79.5–101.0)
MONO ABS: 1.1 10*3/uL — AB (ref 0.1–0.9)
MONOS PCT: 10 %
Neutro Abs: 7.3 10*3/uL — ABNORMAL HIGH (ref 1.5–6.5)
Neutrophils Relative %: 67 %
Platelets: 333 10*3/uL (ref 145–400)
RBC: 4.39 MIL/uL (ref 3.70–5.45)
RDW: 12.3 % (ref 11.2–14.5)
WBC: 10.8 10*3/uL — AB (ref 3.9–10.3)

## 2018-03-06 LAB — TSH: TSH: 1.05 u[IU]/mL (ref 0.308–3.960)

## 2018-03-06 LAB — COMPREHENSIVE METABOLIC PANEL
ALBUMIN: 3.7 g/dL (ref 3.5–5.0)
ALK PHOS: 102 U/L (ref 38–126)
ALT: 13 U/L (ref 0–44)
AST: 11 U/L — ABNORMAL LOW (ref 15–41)
Anion gap: 10 (ref 5–15)
BILIRUBIN TOTAL: 0.6 mg/dL (ref 0.3–1.2)
BUN: 13 mg/dL (ref 6–20)
CALCIUM: 10.2 mg/dL (ref 8.9–10.3)
CO2: 27 mmol/L (ref 22–32)
CREATININE: 0.78 mg/dL (ref 0.44–1.00)
Chloride: 103 mmol/L (ref 98–111)
GFR calc Af Amer: >60 mL/min (ref >60)
GLUCOSE: 91 mg/dL (ref 70–99)
POTASSIUM: 4 mmol/L (ref 3.5–5.1)
Sodium: 140 mmol/L (ref 135–145)
TOTAL PROTEIN: 8.5 g/dL — AB (ref 6.5–8.1)

## 2018-03-06 LAB — PREGNANCY, URINE: PREG TEST UR: NEGATIVE

## 2018-03-06 LAB — URIC ACID: Uric Acid, Serum: 4.5 mg/dL (ref 2.5–7.1)

## 2018-03-06 LAB — SEDIMENTATION RATE: Sed Rate: 58 mm/hr — ABNORMAL HIGH (ref 0–22)

## 2018-03-06 LAB — LACTATE DEHYDROGENASE: LDH: 178 U/L (ref 98–192)

## 2018-03-06 MED ORDER — ALLOPURINOL 300 MG PO TABS: 300.0000 mg | ORAL_TABLET | Freq: Every day | ORAL | 0 refills | Status: DC

## 2018-03-06 MED ORDER — PROCHLORPERAZINE MALEATE 10 MG PO TABS: 10.0000 mg | ORAL_TABLET | Freq: Four times a day (QID) | ORAL | 1 refills | Status: DC | PRN

## 2018-03-06 MED ORDER — ONDANSETRON HCL 8 MG PO TABS: 8.0000 mg | ORAL_TABLET | Freq: Three times a day (TID) | ORAL | 1 refills | Status: DC | PRN

## 2018-03-06 MED ORDER — LIDOCAINE-PRILOCAINE 2.5-2.5 % EX CREA: 1.0000 "application " | TOPICAL_CREAM | CUTANEOUS | 6 refills | Status: DC | PRN

## 2018-03-06 MED ORDER — LEUPROLIDE ACETATE 7.5 MG IM KIT: 7.5000 mg | PACK | Freq: Once | INTRAMUSCULAR | Status: DC

## 2018-03-06 NOTE — Telephone Encounter (Signed)
Called Dr Cornett's nurse to let them know we ran multiple labs today including pregnancy test. Also requested per patient and Dr Alvy Bimler to leave port accessed post placement tomorrow- starts chemo Thursday am.

## 2018-03-07 ENCOUNTER — Other Ambulatory Visit: Payer: Self-pay | Admitting: Hematology and Oncology

## 2018-03-07 ENCOUNTER — Ambulatory Visit (HOSPITAL_BASED_OUTPATIENT_CLINIC_OR_DEPARTMENT_OTHER): Payer: 59 | Admitting: Anesthesiology

## 2018-03-07 ENCOUNTER — Encounter (HOSPITAL_BASED_OUTPATIENT_CLINIC_OR_DEPARTMENT_OTHER): Payer: Self-pay | Admitting: Anesthesiology

## 2018-03-07 ENCOUNTER — Encounter: Payer: Self-pay | Admitting: Hematology and Oncology

## 2018-03-07 ENCOUNTER — Ambulatory Visit (HOSPITAL_COMMUNITY): Payer: 59

## 2018-03-07 ENCOUNTER — Other Ambulatory Visit: Payer: Self-pay

## 2018-03-07 ENCOUNTER — Telehealth: Payer: Self-pay | Admitting: Hematology and Oncology

## 2018-03-07 ENCOUNTER — Ambulatory Visit (HOSPITAL_BASED_OUTPATIENT_CLINIC_OR_DEPARTMENT_OTHER)
Admission: RE | Admit: 2018-03-07 | Discharge: 2018-03-07 | Disposition: A | Discharge status: Home / Self Care | Payer: 59 | Source: Ambulatory Visit | Attending: Surgery | Admitting: Surgery

## 2018-03-07 ENCOUNTER — Encounter (HOSPITAL_BASED_OUTPATIENT_CLINIC_OR_DEPARTMENT_OTHER)
Admission: RE | Disposition: A | Discharge status: Home / Self Care | Payer: Self-pay | Source: Ambulatory Visit | Attending: Surgery

## 2018-03-07 DIAGNOSIS — Z8249 Family history of ischemic heart disease and other diseases of the circulatory system: Secondary | ICD-10-CM | POA: Insufficient documentation

## 2018-03-07 DIAGNOSIS — R0600 Dyspnea, unspecified: Secondary | ICD-10-CM | POA: Diagnosis not present

## 2018-03-07 DIAGNOSIS — C8191 Hodgkin lymphoma, unspecified, lymph nodes of head, face, and neck: Secondary | ICD-10-CM | POA: Insufficient documentation

## 2018-03-07 DIAGNOSIS — Z87891 Personal history of nicotine dependence: Secondary | ICD-10-CM | POA: Diagnosis not present

## 2018-03-07 DIAGNOSIS — F329 Major depressive disorder, single episode, unspecified: Secondary | ICD-10-CM | POA: Insufficient documentation

## 2018-03-07 DIAGNOSIS — Z95828 Presence of other vascular implants and grafts: Secondary | ICD-10-CM

## 2018-03-07 DIAGNOSIS — F419 Anxiety disorder, unspecified: Secondary | ICD-10-CM | POA: Diagnosis not present

## 2018-03-07 DIAGNOSIS — Z419 Encounter for procedure for purposes other than remedying health state, unspecified: Secondary | ICD-10-CM

## 2018-03-07 DIAGNOSIS — Z87440 Personal history of urinary (tract) infections: Secondary | ICD-10-CM | POA: Diagnosis not present

## 2018-03-07 HISTORY — PX: PORTACATH PLACEMENT: SHX2246

## 2018-03-07 SURGERY — INSERTION, TUNNELED CENTRAL VENOUS DEVICE, WITH PORT
Anesthesia: General | Site: Chest | Laterality: Left

## 2018-03-07 MED ORDER — FENTANYL CITRATE (PF) 100 MCG/2ML IJ SOLN
50.0000 ug | INTRAMUSCULAR | Status: DC | PRN
  Administered 2018-03-07: 100 ug via INTRAVENOUS

## 2018-03-07 MED ORDER — ONDANSETRON HCL 4 MG/2ML IJ SOLN
INTRAMUSCULAR | Status: DC | PRN
  Administered 2018-03-07: 4 mg via INTRAVENOUS

## 2018-03-07 MED ORDER — CHLORHEXIDINE GLUCONATE CLOTH 2 % EX PADS: 6.0000 | MEDICATED_PAD | Freq: Once | CUTANEOUS | Status: DC

## 2018-03-07 MED ORDER — DEXAMETHASONE SODIUM PHOSPHATE 4 MG/ML IJ SOLN
INTRAMUSCULAR | Status: DC | PRN
  Administered 2018-03-07: 10 mg via INTRAVENOUS

## 2018-03-07 MED ORDER — HYDROMORPHONE HCL 1 MG/ML IJ SOLN
INTRAMUSCULAR | Status: AC
  Filled 2018-03-07: qty 0.5

## 2018-03-07 MED ORDER — MIDAZOLAM HCL 2 MG/2ML IJ SOLN
INTRAMUSCULAR | Status: AC
  Filled 2018-03-07: qty 2

## 2018-03-07 MED ORDER — MEPERIDINE HCL 25 MG/ML IJ SOLN: 6.2500 mg | INTRAMUSCULAR | Status: DC | PRN

## 2018-03-07 MED ORDER — CELECOXIB 200 MG PO CAPS
200.0000 mg | ORAL_CAPSULE | ORAL | Status: AC
  Administered 2018-03-07: 200 mg via ORAL

## 2018-03-07 MED ORDER — SODIUM CHLORIDE 0.9 % IJ SOLN
INTRAMUSCULAR | Status: AC
  Filled 2018-03-07: qty 10

## 2018-03-07 MED ORDER — DEXTROSE 5 % IV SOLN
3.0000 g | INTRAVENOUS | Status: AC
  Administered 2018-03-07: 2 g via INTRAVENOUS

## 2018-03-07 MED ORDER — CEFAZOLIN SODIUM-DEXTROSE 2-4 GM/100ML-% IV SOLN
INTRAVENOUS | Status: AC
  Filled 2018-03-07: qty 100

## 2018-03-07 MED ORDER — OXYCODONE HCL 5 MG PO TABS: 5.0000 mg | ORAL_TABLET | Freq: Four times a day (QID) | ORAL | 0 refills | Status: DC | PRN

## 2018-03-07 MED ORDER — OXYCODONE HCL 5 MG/5ML PO SOLN: 5.0000 mg | Freq: Once | ORAL | Status: DC | PRN

## 2018-03-07 MED ORDER — CELECOXIB 200 MG PO CAPS
ORAL_CAPSULE | ORAL | Status: AC
  Filled 2018-03-07: qty 1

## 2018-03-07 MED ORDER — ACETAMINOPHEN 500 MG PO TABS
ORAL_TABLET | ORAL | Status: AC
  Filled 2018-03-07: qty 2

## 2018-03-07 MED ORDER — PROPOFOL 10 MG/ML IV BOLUS
INTRAVENOUS | Status: DC | PRN
  Administered 2018-03-07: 200 mg via INTRAVENOUS

## 2018-03-07 MED ORDER — ONDANSETRON HCL 4 MG/2ML IJ SOLN
INTRAMUSCULAR | Status: AC
  Filled 2018-03-07: qty 2

## 2018-03-07 MED ORDER — PROMETHAZINE HCL 25 MG/ML IJ SOLN
6.2500 mg | INTRAMUSCULAR | Status: DC | PRN
  Administered 2018-03-07: 6.25 mg via INTRAVENOUS

## 2018-03-07 MED ORDER — SCOPOLAMINE 1 MG/3DAYS TD PT72: 1.0000 | MEDICATED_PATCH | Freq: Once | TRANSDERMAL | Status: DC | PRN

## 2018-03-07 MED ORDER — PHENYLEPHRINE HCL 10 MG/ML IJ SOLN
INTRAMUSCULAR | Status: DC | PRN
  Administered 2018-03-07 (×2): 40 ug via INTRAVENOUS

## 2018-03-07 MED ORDER — GABAPENTIN 300 MG PO CAPS
300.0000 mg | ORAL_CAPSULE | ORAL | Status: AC
  Administered 2018-03-07: 300 mg via ORAL

## 2018-03-07 MED ORDER — PROMETHAZINE HCL 25 MG/ML IJ SOLN
INTRAMUSCULAR | Status: AC
  Filled 2018-03-07: qty 1

## 2018-03-07 MED ORDER — HEPARIN (PORCINE) IN NACL 2-0.9 UNITS/ML
INTRAMUSCULAR | Status: AC | PRN
  Administered 2018-03-07: 1 via INTRAVENOUS

## 2018-03-07 MED ORDER — DEXAMETHASONE SODIUM PHOSPHATE 10 MG/ML IJ SOLN
INTRAMUSCULAR | Status: AC
  Filled 2018-03-07: qty 1

## 2018-03-07 MED ORDER — HYDROMORPHONE HCL 1 MG/ML IJ SOLN
0.2500 mg | INTRAMUSCULAR | Status: DC | PRN
  Administered 2018-03-07 (×2): 0.25 mg via INTRAVENOUS

## 2018-03-07 MED ORDER — HEPARIN SOD (PORK) LOCK FLUSH 100 UNIT/ML IV SOLN
INTRAVENOUS | Status: DC | PRN
  Administered 2018-03-07: 500 [IU] via INTRAVENOUS

## 2018-03-07 MED ORDER — FENTANYL CITRATE (PF) 100 MCG/2ML IJ SOLN
INTRAMUSCULAR | Status: AC
Start: 2018-03-07 — End: ?
  Filled 2018-03-07: qty 2

## 2018-03-07 MED ORDER — ACETAMINOPHEN 500 MG PO TABS
1000.0000 mg | ORAL_TABLET | ORAL | Status: AC
Start: 2018-03-07 — End: 2018-03-07
  Administered 2018-03-07: 1000 mg via ORAL

## 2018-03-07 MED ORDER — IBUPROFEN 800 MG PO TABS: 800.0000 mg | ORAL_TABLET | Freq: Three times a day (TID) | ORAL | 0 refills | Status: DC | PRN

## 2018-03-07 MED ORDER — GABAPENTIN 300 MG PO CAPS
ORAL_CAPSULE | ORAL | Status: AC
  Filled 2018-03-07: qty 1

## 2018-03-07 MED ORDER — LIDOCAINE HCL (CARDIAC) PF 100 MG/5ML IV SOSY
PREFILLED_SYRINGE | INTRAVENOUS | Status: DC | PRN
  Administered 2018-03-07: 70 mg via INTRAVENOUS

## 2018-03-07 MED ORDER — MIDAZOLAM HCL 2 MG/2ML IJ SOLN
1.0000 mg | INTRAMUSCULAR | Status: DC | PRN
  Administered 2018-03-07: 2 mg via INTRAVENOUS

## 2018-03-07 MED ORDER — OXYCODONE HCL 5 MG PO TABS: 5.0000 mg | ORAL_TABLET | Freq: Once | ORAL | Status: DC | PRN

## 2018-03-07 MED ORDER — BUPIVACAINE-EPINEPHRINE 0.25% -1:200000 IJ SOLN
INTRAMUSCULAR | Status: DC | PRN
  Administered 2018-03-07: 10 mL

## 2018-03-07 MED ORDER — LACTATED RINGERS IV SOLN
INTRAVENOUS | Status: DC
  Administered 2018-03-07 (×2): via INTRAVENOUS

## 2018-03-07 SURGICAL SUPPLY — 58 items
BAG DECANTER FOR FLEXI CONT (MISCELLANEOUS) ×3 IMPLANT
BENZOIN TINCTURE PRP APPL 2/3 (GAUZE/BANDAGES/DRESSINGS) IMPLANT
BLADE HEX COATED 2.75 (ELECTRODE) ×3 IMPLANT
BLADE SURG 11 STRL SS (BLADE) ×3 IMPLANT
BLADE SURG 15 STRL LF DISP TIS (BLADE) ×1 IMPLANT
BLADE SURG 15 STRL SS (BLADE) ×2
CANISTER SUCT 1200ML W/VALVE (MISCELLANEOUS) IMPLANT
CHLORAPREP W/TINT 26ML (MISCELLANEOUS) ×3 IMPLANT
CLOSURE WOUND 1/2 X4 (GAUZE/BANDAGES/DRESSINGS)
COVER BACK TABLE 60X90IN (DRAPES) ×3 IMPLANT
COVER MAYO STAND STRL (DRAPES) ×3 IMPLANT
COVER PROBE 5X48 (MISCELLANEOUS) ×2
DECANTER SPIKE VIAL GLASS SM (MISCELLANEOUS) IMPLANT
DERMABOND ADVANCED (GAUZE/BANDAGES/DRESSINGS) ×2
DERMABOND ADVANCED .7 DNX12 (GAUZE/BANDAGES/DRESSINGS) ×1 IMPLANT
DRAPE C-ARM 42X72 X-RAY (DRAPES) ×3 IMPLANT
DRAPE LAPAROSCOPIC ABDOMINAL (DRAPES) ×3 IMPLANT
DRAPE UTILITY XL STRL (DRAPES) ×3 IMPLANT
DRSG TEGADERM 2-3/8X2-3/4 SM (GAUZE/BANDAGES/DRESSINGS) IMPLANT
ELECT REM PT RETURN 9FT ADLT (ELECTROSURGICAL) ×3
ELECTRODE REM PT RTRN 9FT ADLT (ELECTROSURGICAL) ×1 IMPLANT
GAUZE SPONGE 4X4 12PLY STRL LF (GAUZE/BANDAGES/DRESSINGS) IMPLANT
GLOVE BIOGEL PI IND STRL 7.0 (GLOVE) ×1 IMPLANT
GLOVE BIOGEL PI IND STRL 8 (GLOVE) ×1 IMPLANT
GLOVE BIOGEL PI INDICATOR 7.0 (GLOVE) ×2
GLOVE BIOGEL PI INDICATOR 8 (GLOVE) ×2
GLOVE ECLIPSE 8.0 STRL XLNG CF (GLOVE) ×3 IMPLANT
GLOVE SURG SS PI 6.5 STRL IVOR (GLOVE) ×3 IMPLANT
GOWN STRL REUS W/ TWL LRG LVL3 (GOWN DISPOSABLE) ×2 IMPLANT
GOWN STRL REUS W/TWL LRG LVL3 (GOWN DISPOSABLE) ×4
IV KIT MINILOC 20X1 SAFETY (NEEDLE) IMPLANT
KIT CVR 48X5XPRB PLUP LF (MISCELLANEOUS) ×1 IMPLANT
KIT PORT POWER 8FR ISP CVUE (Port) ×3 IMPLANT
NDL SAFETY ECLIPSE 18X1.5 (NEEDLE) IMPLANT
NEEDLE HYPO 18GX1.5 SHARP (NEEDLE)
NEEDLE HYPO 22GX1.5 SAFETY (NEEDLE) IMPLANT
NEEDLE HYPO 25X1 1.5 SAFETY (NEEDLE) ×3 IMPLANT
NEEDLE SPNL 22GX3.5 QUINCKE BK (NEEDLE) IMPLANT
PACK BASIN DAY SURGERY FS (CUSTOM PROCEDURE TRAY) ×3 IMPLANT
PENCIL BUTTON HOLSTER BLD 10FT (ELECTRODE) ×3 IMPLANT
SET SHEATH INTRODUCER 10FR (MISCELLANEOUS) IMPLANT
SHEATH COOK PEEL AWAY SET 9F (SHEATH) IMPLANT
SLEEVE SCD COMPRESS KNEE MED (MISCELLANEOUS) ×3 IMPLANT
SPONGE LAP 4X18 RFD (DISPOSABLE) IMPLANT
STRIP CLOSURE SKIN 1/2X4 (GAUZE/BANDAGES/DRESSINGS) IMPLANT
SUT MON AB 4-0 PC3 18 (SUTURE) ×3 IMPLANT
SUT PROLENE 2 0 CT2 30 (SUTURE) IMPLANT
SUT PROLENE 2 0 SH DA (SUTURE) ×3 IMPLANT
SUT SILK 2 0 TIES 17X18 (SUTURE)
SUT SILK 2-0 18XBRD TIE BLK (SUTURE) IMPLANT
SUT VICRYL 3-0 CR8 SH (SUTURE) ×3 IMPLANT
SYR 5ML LUER SLIP (SYRINGE) ×3 IMPLANT
SYR CONTROL 10ML LL (SYRINGE) ×3 IMPLANT
TOWEL GREEN STERILE FF (TOWEL DISPOSABLE) ×6 IMPLANT
TOWEL OR NON WOVEN STRL DISP B (DISPOSABLE) ×3 IMPLANT
TUBE CONNECTING 20'X1/4 (TUBING)
TUBE CONNECTING 20X1/4 (TUBING) IMPLANT
YANKAUER SUCT BULB TIP NO VENT (SUCTIONS) IMPLANT

## 2018-03-07 NOTE — Interval H&P Note (Signed)
History and Physical Interval Note:  03/07/2018 9:20 AM  Adrienne [name]  has presented today for surgery, with the diagnosis of HODGKIN LYMPHOMA  The various methods of treatment have been discussed with the patient and family. After consideration of risks, benefits and other options for treatment, the patient has consented to  Procedure(s): INSERTION PORT-A-CATH WITH ULTRASOUND (N/A) as a surgical intervention .  The patient's history has been reviewed, patient examined, no change in status, stable for surgery.  I have reviewed the patient's chart and labs.  Questions were answered to the patient's satisfaction.  The procedure has been discussed with the patient.  Alternative therapies have been discussed with the patient.  Operative risks include bleeding,  Infection,  Organ injury,  Collapse lung,  Bleeding around the heart,   Nerve injury,  Blood vessel injury,  DVT,  Pulmonary embolism,  Death,  And possible reoperation.  Medical management risks include worsening of present situation.  The success of the procedure is 50 -90 % at treating patients symptoms.  The patient understands and agrees to proceed.   [place]

## 2018-03-07 NOTE — Discharge Instructions (Signed)
PORT-A-CATH: POST OP INSTRUCTIONS  Always review your discharge instruction sheet given to you by the facility where your surgery was performed.   1. A prescription for pain medication may be given to you upon discharge. Take your pain medication as prescribed, if needed. If narcotic pain medicine is not needed, then you make take acetaminophen (Tylenol) or ibuprofen (Advil) as needed.  2. Take your usually prescribed medications unless otherwise directed. 3. If you need a refill on your pain medication, please contact our office. All narcotic pain medicine now requires a paper prescription.  Phoned in and fax refills are no longer allowed by law.  Prescriptions will not be filled after 5 pm or on weekends.  4. You should follow a light diet for the remainder of the day after your procedure. 5. Most patients will experience some mild swelling and/or bruising in the area of the incision. It may take several days to resolve. 6. It is common to experience some constipation if taking pain medication after surgery. Increasing fluid intake and taking a stool softener (such as Colace) will usually help or prevent this problem from occurring. A mild laxative (Milk of Magnesia or Miralax) should be taken according to package directions if there are no bowel movements after 48 hours.  7. Unless discharge instructions indicate otherwise, you may remove your bandages 48 hours after surgery, and you may shower at that time. You may have steri-strips (small white skin tapes) in place directly over the incision.  These strips should be left on the skin for 7-10 days.  If your surgeon used Dermabond (skin glue) on the incision, you may shower in 24 hours.  The glue will flake off over the next 2-3 weeks.  8. If your port is left accessed at the end of surgery (needle left in port), the dressing cannot get wet and should only by changed by a healthcare professional. When the port is no longer accessed (when the  needle has been removed), follow step 7.   9. ACTIVITIES:  Limit activity involving your arms for the next 72 hours. Do no strenuous exercise or activity for 1 week. You may drive when you are no longer taking prescription pain medication, you can comfortably wear a seatbelt, and you can maneuver your car. 10.You may need to see your doctor in the office for a follow-up appointment.  Please       check with your doctor.  11.When you receive a new Port-a-Cath, you will get a product guide and        ID card.  Please keep them in case you need them.  WHEN TO CALL YOUR DOCTOR [phone]): 1. Fever over 101.0 2. Chills 3. Continued bleeding from incision 4. Increased redness and tenderness at the site 5. Shortness of breath, difficulty breathing   The clinic staff is available to answer your questions during regular business hours. Please dont hesitate to call and ask to speak to one of the nurses or medical assistants for clinical concerns. If you have a medical emergency, go to the nearest emergency room or call 911.  A surgeon from [facility] is always on call at the hospital.     For further information, please visit [email or web address]   Tylenol 1,000mg  given today at [facility] Instructions  Activity: Get plenty of rest for the remainder of the day. A responsible individual must stay with you for 24 hours following the procedure.  For the  next 24 hours, DO NOT: -Drive a car -Paediatric nurse -Drink alcoholic beverages -Take any medication unless instructed by your physician -Make any legal decisions or sign important papers.  Meals: Start with liquid foods such as gelatin or soup. Progress to regular foods as tolerated. Avoid greasy, spicy, heavy foods. If nausea and/or vomiting occur, drink only clear liquids until the nausea and/or vomiting subsides. Call your physician if vomiting continues.  Special  Instructions/Symptoms: Your throat may feel dry or sore from the anesthesia or the breathing tube placed in your throat during surgery. If this causes discomfort, gargle with warm salt water. The discomfort should disappear within 24 hours.  If you had a scopolamine patch placed behind your ear for the management of post- operative nausea and/or vomiting:  1. The medication in the patch is effective for 72 hours, after which it should be removed.  Wrap patch in a tissue and discard in the trash. Wash hands thoroughly with soap and water. 2. You may remove the patch earlier than 72 hours if you experience unpleasant side effects which may include dry mouth, dizziness or visual disturbances. 3. Avoid touching the patch. Wash your hands with soap and water after contact with the patch.

## 2018-03-07 NOTE — Progress Notes (Signed)
[facility] OFFICE PROGRESS NOTE  Patient Care Team: [name], MD as PCP - General (Family Medicine)  ASSESSMENT & PLAN:  Classical Hodgkin lymphoma [facility]) I have reviewed imaging studies and results of pulmonary function test and echocardiogram Currently, she has unfavorable, non-bulky stage IIb disease She has presence of B symptoms with night sweats, skin itching and skin rash We reviewed the current guidelines I recommend combination chemotherapy with Adriamycin, Bleomycin, vinblastine and dacarbazine. The risk, benefits, side effects of treatment including risk of cardiomyopathy, pulmonary toxicity, pancytopenia, infection, neuropathy, infertility, and others were discussed with the patient and she agreed to proceed We discussed about the use of Lupron in an attempt to preserve her fertility and she agreed to proceed I plan to see her back next week for further follow-up I recommend starting her on allopurinol for tumor lysis prophylaxis Goal of treatment is curative intent  Encounter for fertility preservation counseling prior to cancer therapy We have extensive discussion about plan for cryopreservation versus referral to fertility clinic versus Lupron Ultimately, the patient is interested to get Lupron injection in an attempt to preserve her fertility   No orders of the defined types were placed in this encounter.   INTERVAL HISTORY: Please see below for problem oriented charting. She returns with her mother and husband to review test results and for chemotherapy consent Since her last time I saw her, she continues to have frequent night sweats, skin rash and skin itching.  She denies other new lymphadenopathy. Her appetite is stable.  SUMMARY OF ONCOLOGIC HISTORY:   Classical Hodgkin lymphoma ([place])   01/22/2018 Imaging    CT scan of neck: Adenopathy throughout the right lateral neck, right supraclavicular fossa, and upper mediastinum. There is no  specific imaging feature, lymphoma or atypical infection are the primary considerations and excisional biopsy should be considered if there is no diagnosis by history or labs.       01/31/2018 Pathology Results    Lymph node for lymphoma, right supraclavicular / right cervical - ATYPICAL LYMPHOID PROLIFERATION. - SEE COMMENT. Microscopic Comment Sections show a few very small needle core biopsy fragments of lymph nodal tissue displaying a polymorphous cellular proliferation of small lymphocytes, plasma cells, eosinophils and histiocytes in addition to variable numbers of large mononuclear and multilobated lymphoid appearing cells with variably prominent nucleoli. Flow cytometric was performed ([id number]) and failed to show any monoclonal B-cell population or abnormal T-cell phenotype. Immunohistochemical stains were performed, including CD15, CD20, CD3, CD30, LCA, and PAX-5 with appropriate controls. The large atypical lymphoid appearing cells are positive for CD15, CD30, CD20 and PAX-5 and negative for CD3 and LCA. The small lymphoid component in the background is mostly composed of T cells. The morphologic and phenotypic features are atypical and highly suspicious for involvement by a lymphoproliferative process, particularly classical Hodgkin lymphoma. Excisional biopsy is recommended.      01/31/2018 Procedure    Ultrasound-guided core biopsies of right cervical lymph nodes.      02/15/2018 Pathology Results    Lymph node for lymphoma, Right Cervical - CLASSICAL HODGKIN LYMPHOMA, NODULAR SCLEROSIS TYPE      03/05/2018 PET scan    1. Hypermetabolic RIGHT level 2 lymph nodes, supraclavicular lymph nodes, sub pectoralis lymph nodes and RIGHT axillary lymph nodes. 2. Hypermetabolic mediastinal lymph nodes. 3. No evidence of lymphoma beneath the diaphragms. Normal spleen. 4. Normal bone marrow.      03/05/2018 Imaging    LV EF: 55% -  60%  03/06/2018 Cancer Staging    Staging form:  Hodgkin and Non-Hodgkin Lymphoma, AJCC 8th Edition - Clinical stage from 03/06/2018: Stage II (Hodgkin lymphoma, B - Symptoms) - Signed by Heath Lark, MD on 03/06/2018       REVIEW OF SYSTEMS:   Eyes: Denies blurriness of vision Ears, nose, mouth, throat, and face: Denies mucositis or sore throat Respiratory: Denies cough, dyspnea or wheezes Cardiovascular: Denies palpitation, chest discomfort or lower extremity swelling Gastrointestinal:  Denies nausea, heartburn or change in bowel habits Neurological:Denies numbness, tingling or new weaknesses Behavioral/Psych: Mood is stable, no new changes  All other systems were reviewed with the patient and are negative.  I have reviewed the past medical history, past surgical history, social history and family history with the patient and they are unchanged from previous note.  ALLERGIES:  has No Known Allergies.  MEDICATIONS:  Current Outpatient Medications  Medication Sig Dispense Refill  . allopurinol (ZYLOPRIM) 300 MG tablet Take 1 tablet (300 mg total) by mouth daily. 30 tablet 0  . ALPRAZolam (XANAX) 0.5 MG tablet Take 0.5 mg by mouth 2 (two) times daily as needed.    . hydrOXYzine (ATARAX/VISTARIL) 25 MG tablet Take 25 mg by mouth 3 (three) times daily as needed.    Marland Kitchen ibuprofen (ADVIL,MOTRIN) 800 MG tablet Take 1 tablet (800 mg total) by mouth every 8 (eight) hours as needed. 30 tablet 0  . ibuprofen (ADVIL,MOTRIN) 800 MG tablet Take 1 tablet (800 mg total) by mouth every 8 (eight) hours as needed. 30 tablet 0  . lidocaine-prilocaine (EMLA) cream Apply 1 application topically as needed. 30 g 6  . ondansetron (ZOFRAN) 8 MG tablet Take 1 tablet (8 mg total) by mouth every 8 (eight) hours as needed. Start on the third day after chemotherapy. 30 tablet 1  . oxyCODONE (OXY IR/ROXICODONE) 5 MG immediate release tablet Take 1 tablet (5 mg total) by mouth every 6 (six) hours as needed for severe pain. 10 tablet 0  . prochlorperazine (COMPAZINE) 10  MG tablet Take 1 tablet (10 mg total) by mouth every 6 (six) hours as needed (Nausea or vomiting). 30 tablet 1   No current facility-administered medications for this visit.     PHYSICAL EXAMINATION: ECOG PERFORMANCE STATUS: 1 - Symptomatic but completely ambulatory  Vitals:   03/06/18 1020  BP: 128/83  Pulse: 77  Resp: 18  Temp: (!) 97.5 F (36.4 C)  SpO2: 100%   Filed Weights   03/06/18 1020  Weight: 179 lb 8 oz (81.4 kg)    GENERAL:alert, no distress and comfortable SKIN: She has a faint skin rash on her chest EYES: normal, Conjunctiva are pink and non-injected, sclera clear OROPHARYNX:no exudate, no erythema and lips, buccal mucosa, and tongue normal  NECK: supple, thyroid normal size, non-tender, without nodularity Noted lymphadenopathy in the cervical region  LUNGS: clear to auscultation and percussion with normal breathing effort HEART: regular rate & rhythm and no murmurs and no lower extremity edema ABDOMEN:abdomen soft, non-tender and normal bowel sounds Musculoskeletal:no cyanosis of digits and no clubbing  NEURO: alert & oriented x 3 with fluent speech, no focal motor/sensory deficits  LABORATORY DATA:  I have reviewed the data as listed    Component Value Date/Time   NA 140 03/06/2018 0754   K 4.0 03/06/2018 0754   CL 103 03/06/2018 0754   CO2 27 03/06/2018 0754   GLUCOSE 91 03/06/2018 0754   BUN 13 03/06/2018 0754   CREATININE 0.78 03/06/2018 0754   CALCIUM 10.2  03/06/2018 0754   PROT 8.5 (H) 03/06/2018 0754   ALBUMIN 3.7 03/06/2018 0754   AST 11 (L) 03/06/2018 0754   ALT 13 03/06/2018 0754   ALKPHOS 102 03/06/2018 0754   BILITOT 0.6 03/06/2018 0754   GFRNONAA >60 03/06/2018 0754   GFRAA >60 03/06/2018 0754    No results found for: SPEP, UPEP  Lab Results  Component Value Date   WBC 10.8 (H) 03/06/2018   NEUTROABS 7.3 (H) 03/06/2018   HGB 12.3 03/06/2018   HCT 38.4 03/06/2018   MCV 87.5 03/06/2018   PLT 333 03/06/2018      Chemistry       Component Value Date/Time   NA 140 03/06/2018 0754   K 4.0 03/06/2018 0754   CL 103 03/06/2018 0754   CO2 27 03/06/2018 0754   BUN 13 03/06/2018 0754   CREATININE 0.78 03/06/2018 0754      Component Value Date/Time   CALCIUM 10.2 03/06/2018 0754   ALKPHOS 102 03/06/2018 0754   AST 11 (L) 03/06/2018 0754   ALT 13 03/06/2018 0754   BILITOT 0.6 03/06/2018 0754       RADIOGRAPHIC STUDIES: I have reviewed imaging study with patient and family I have personally reviewed the radiological images as listed and agreed with the findings in the report. Nm Pet Image Initial (pi) Skull Base To Thigh  Result Date: 03/05/2018 CLINICAL DATA:  Initial treatment strategy for Hodgkin's lymphoma. EXAM: NUCLEAR MEDICINE PET SKULL BASE TO THIGH TECHNIQUE: 8.5 mCi F-18 FDG was injected intravenously. Full-ring PET imaging was performed from the skull base to thigh after the radiotracer. CT data was obtained and used for attenuation correction and anatomic localization. Fasting blood glucose: 86 mg/dl COMPARISON:  None. FINDINGS: Mediastinal blood pool activity: SUV max 1.7 NECK: Hypermetabolic RIGHT level III lymph nodes SUV max equal 10.1. Nodes are bulky and measure up to 2 cm short axis beneath the RIGHT sternocleidomastoid muscle. Nodes extend along the RIGHT thyroid gland to the supraclavicular nodal station. RIGHT supraclavicular nodes with SUV max equal 12.7 are in a masslike conglomerate measuring 3.7 cm. Incidental CT findings: none CHEST: Hypermetabolic RIGHT axillary and sub pectoralis lymph nodes with sub pectoralis lymph node SUV max equal 9.5. Adenopathy extends the mediastinum with hypermetabolic prevascular lymph nodes with SUV max equal 13.4. Prevascular lymph node measures 1.7 cm short axis. Hypermetabolic subcarinal lymph node which is is the most inferior mediastinal lymph node. Incidental CT findings: No pulmonary nodule ABDOMEN/PELVIS: This spleen is normal size and normal metabolic  activity. No hypermetabolic upper abdominal lymph nodes. No hypermetabolic pelvic lymph nodes. No abnormal liver activity. Incidental CT findings: IUD in expected location SKELETON: No abnormal marrow activity. Incidental CT findings: none IMPRESSION: 1. Hypermetabolic RIGHT level 2 lymph nodes, supraclavicular lymph nodes, sub pectoralis lymph nodes and RIGHT axillary lymph nodes. 2. Hypermetabolic mediastinal lymph nodes. 3. No evidence of lymphoma beneath the diaphragms.  Normal spleen. 4. Normal bone marrow. Electronically Signed   By: Suzy Bouchard M.D.   On: 03/05/2018 14:18   Dg Chest Port 1 View  Result Date: 03/07/2018 CLINICAL DATA:  Status post Port-A-Cath insertion. EXAM: PORTABLE CHEST 1 VIEW COMPARISON:  None. FINDINGS: The cardiomediastinal silhouette is unremarkable. A LEFT IJ Port-A-Cath is noted with tip overlying the SUPERIOR cavoatrial junction. There is no evidence of focal airspace disease, pulmonary edema, suspicious pulmonary nodule/mass, pleural effusion, or pneumothorax. No acute bony abnormalities are identified. IMPRESSION: LEFT IJ Port-A-Cath with tip overlying the SUPERIOR cavoatrial junction. No evidence of  pneumothorax. Electronically Signed   By: Margarette Canada M.D.   On: 03/07/2018 11:07   Dg Fluoro Guide Cv Line-no Report  Result Date: 03/07/2018 Fluoroscopy was utilized by the requesting physician.  No radiographic interpretation.    All questions were answered. The patient knows to call the clinic with any problems, questions or concerns. No barriers to learning was detected.  I spent 30 minutes counseling the patient face to face. The total time spent in the appointment was 40 minutes and more than 50% was on counseling and review of test results  Heath Lark, MD 03/07/2018 5:39 PM

## 2018-03-07 NOTE — Assessment & Plan Note (Signed)
We have extensive discussion about plan for cryopreservation versus referral to fertility clinic versus Lupron Ultimately, the patient is interested to get Lupron injection in an attempt to preserve her fertility

## 2018-03-07 NOTE — Anesthesia Procedure Notes (Signed)
Procedure Name: LMA Insertion Date/Time: 03/07/2018 9:54 AM Performed by: Maryella Shivers, CRNA Pre-anesthesia Checklist: Patient identified, Emergency Drugs available, Suction available and Patient being monitored Patient Re-evaluated:Patient Re-evaluated prior to induction Oxygen Delivery Method: Circle system utilized Preoxygenation: Pre-oxygenation with 100% oxygen Induction Type: IV induction Ventilation: Mask ventilation without difficulty LMA: LMA inserted LMA Size: 4.0 Number of attempts: 1 Airway Equipment and Method: Bite block Placement Confirmation: positive ETCO2 Tube secured with: Tape Dental Injury: Teeth and Oropharynx as per pre-operative assessment

## 2018-03-07 NOTE — Op Note (Signed)
Preoperative diagnosis: PAC needed  Postoperative diagnosis: Same  Procedure: Portacath Placement with C arm and U/S guidance   Surgeon: Turner Daniels, MD, FACS  Anesthesia: General and 0.25 % marcaine with epinephrine  Clinical History and Indications: The patient is getting ready to begin chemotherapy for her cancer. She  needs a Port-A-Cath for venous access. Risk of bleeding, infection,  Collapse lung,  Death,  DVT,  Organ injury,  Mediastinal injury,  Injury to heart,  Injury to blood vessels,  Nerves,  Migration of catheter,  Embolization of catheter and the need for more surgery.  Description of Procedure: I have seen the patient in the holding area and confirmed the plans for the procedure as noted above. I reviewed the risks and complications again and the patient has no further questions. She wishes to proceed.   The patient was then taken to the operating room. After satisfactory general  anesthesia had been obtained the upper chest and lower neck were prepped and draped as a sterile field. The timeout was done.  The left  internal jugular vein  was entered under U/S guidance  and the guidewire threaded into the superior vena cava right atrial area under fluoroscopic guidance. An incision was then made on the anterior chest wall and a subcutaneous pocket fashioned for the port reservoir.  The port tubing was then brought through a subcutaneous tunnel from the port site to the guidewire site.  The port and catheter were attached, locked  and flushed. The catheter was measured and cut to appropriate length.The dilator and peel-away sheath were then advanced over the guidewire while monitoring this with fluoroscopy. The guidewire and dilator were removed and the tubing threaded to approximately 23 cm. The peel-away sheath was then removed. The catheter aspirated and flushed easily. Using fluoroscopy the tip was in the superior vena cava right atrial junction area. It aspirated and  flushed easily. That aspirated and flushed easily.  The reservoir was secured to the fascia with 1 sutures of 2-0 Prolene. A final check with fluoroscopy was done to make sure we had no kinks and good positioning of the tip of the catheter. Everything appeared to be okay. The catheter was aspirated, flushed with dilute heparin and then concentrated aqueous heparin. The catheter was left accessed.   The incision was then closed with interrupted 3-0 Vicryl, and 4-0 Monocryl subcuticular with Dermabond on the skin. Bulky dressing applied.   There were no operative complications. Estimated blood loss was minimal. All counts were correct. The patient tolerated the procedure well.  Turner Daniels, MD, FACS

## 2018-03-07 NOTE — Assessment & Plan Note (Addendum)
I have reviewed imaging studies and results of pulmonary function test and echocardiogram Currently, she has unfavorable, non-bulky stage IIb disease She has presence of B symptoms with night sweats, skin itching and skin rash We reviewed the current guidelines I recommend combination chemotherapy with Adriamycin, Bleomycin, vinblastine and dacarbazine. The risk, benefits, side effects of treatment including risk of cardiomyopathy, pulmonary toxicity, pancytopenia, infection, neuropathy, infertility, and others were discussed with the patient and she agreed to proceed We discussed about the use of Lupron in an attempt to preserve her fertility and she agreed to proceed I plan to see her back next week for further follow-up I recommend starting her on allopurinol for tumor lysis prophylaxis Goal of treatment is curative intent

## 2018-03-07 NOTE — Anesthesia Postprocedure Evaluation (Signed)
Anesthesia Post Note  Patient: Adrienne [name]  Procedure(s) Performed: INSERTION PORT-A-CATH WITH ULTRASOUND (Left Chest)     Patient location during evaluation: PACU Anesthesia Type: General Level of consciousness: awake and alert Pain management: pain level controlled Vital Signs Assessment: post-procedure vital signs reviewed and stable Respiratory status: spontaneous breathing, nonlabored ventilation and respiratory function stable Cardiovascular status: blood pressure returned to baseline and stable Postop Assessment: no apparent nausea or vomiting Anesthetic complications: no    Last Vitals:  Vitals:   03/07/18 1215 03/07/18 1255  BP:  109/75  Pulse:  (!) 56  Resp:  18  Temp:  36.6 C  SpO2: 98% 99%    Last Pain:  Vitals:   03/07/18 1255  TempSrc:   PainSc: 0-No pain                 Catalina Gravel

## 2018-03-07 NOTE — Telephone Encounter (Signed)
Added flush appt per 7/30 sch message. Patient aware of update.

## 2018-03-07 NOTE — Transfer of Care (Signed)
Immediate Anesthesia Transfer of Care Note  Patient: Adrienne [name]  Procedure(s) Performed: INSERTION PORT-A-CATH WITH ULTRASOUND (Left Chest)  Patient Location: PACU  Anesthesia Type:General  Level of Consciousness: sedated  Airway & Oxygen Therapy: Patient Spontanous Breathing and Patient connected to face mask oxygen  Post-op Assessment: Report given to RN and Post -op Vital signs reviewed and stable  Post vital signs: Reviewed and stable  Last Vitals:  Vitals Value Taken Time  BP 94/54 03/07/2018 10:50 AM  Temp    Pulse 69 03/07/2018 10:51 AM  Resp 13 03/07/2018 10:51 AM  SpO2 100 % 03/07/2018 10:51 AM  Vitals shown include unvalidated device data.  Last Pain:  Vitals:   03/07/18 0758  TempSrc: Oral  PainSc: 0-No pain         Complications: No apparent anesthesia complications

## 2018-03-07 NOTE — Anesthesia Preprocedure Evaluation (Signed)
Anesthesia Evaluation  Patient identified by MRN, date of birth, ID band Patient awake    Reviewed: Allergy & Precautions, NPO status , Patient's Chart, lab work & pertinent test results  Airway Mallampati: II  TM Distance: >3 FB Neck ROM: Full    Dental no notable dental hx. (+) Teeth Intact, Dental Advisory Given   Pulmonary former smoker,    Pulmonary exam normal breath sounds clear to auscultation       Cardiovascular negative cardio ROS Normal cardiovascular exam Rhythm:Regular Rate:Normal     Neuro/Psych Anxiety Depression negative neurological ROS     GI/Hepatic negative GI ROS, Neg liver ROS,   Endo/Other    Renal/GU negative Renal ROS     Musculoskeletal   Abdominal   Peds  Hematology   Anesthesia Other Findings Lymphoma  Reproductive/Obstetrics negative OB ROS                             Anesthesia Physical  Anesthesia Plan  ASA: III  Anesthesia Plan: General   Post-op Pain Management:    Induction: Intravenous  PONV Risk Score and Plan: 3 and Ondansetron, Dexamethasone and Midazolam  Airway Management Planned: LMA  Additional Equipment:   Intra-op Plan:   Post-operative Plan: Extubation in OR  Informed Consent: I have reviewed the patients History and Physical, chart, labs and discussed the procedure including the risks, benefits and alternatives for the proposed anesthesia with the patient or authorized representative who has indicated his/her understanding and acceptance.   Dental advisory given  Plan Discussed with: CRNA  Anesthesia Plan Comments:         Anesthesia Quick Evaluation

## 2018-03-08 ENCOUNTER — Encounter (HOSPITAL_BASED_OUTPATIENT_CLINIC_OR_DEPARTMENT_OTHER): Payer: Self-pay | Admitting: Surgery

## 2018-03-08 ENCOUNTER — Encounter: Payer: Self-pay | Admitting: Pharmacy Technician

## 2018-03-08 ENCOUNTER — Inpatient Hospital Stay: Payer: 59 | Attending: Hematology and Oncology

## 2018-03-08 VITALS — BP 119/73 | HR 55 | Temp 98.1°F | Resp 16

## 2018-03-08 DIAGNOSIS — Z3162 Encounter for fertility preservation counseling: Secondary | ICD-10-CM

## 2018-03-08 DIAGNOSIS — K1231 Oral mucositis (ulcerative) due to antineoplastic therapy: Secondary | ICD-10-CM | POA: Insufficient documentation

## 2018-03-08 DIAGNOSIS — D701 Agranulocytosis secondary to cancer chemotherapy: Secondary | ICD-10-CM | POA: Diagnosis not present

## 2018-03-08 DIAGNOSIS — C8170 Other classical Hodgkin lymphoma, unspecified site: Secondary | ICD-10-CM

## 2018-03-08 DIAGNOSIS — K5909 Other constipation: Secondary | ICD-10-CM | POA: Insufficient documentation

## 2018-03-08 DIAGNOSIS — C8171 Other classical Hodgkin lymphoma, lymph nodes of head, face, and neck: Secondary | ICD-10-CM | POA: Diagnosis not present

## 2018-03-08 DIAGNOSIS — M898X9 Other specified disorders of bone, unspecified site: Secondary | ICD-10-CM | POA: Insufficient documentation

## 2018-03-08 DIAGNOSIS — F419 Anxiety disorder, unspecified: Secondary | ICD-10-CM | POA: Diagnosis not present

## 2018-03-08 DIAGNOSIS — R110 Nausea: Secondary | ICD-10-CM | POA: Diagnosis not present

## 2018-03-08 DIAGNOSIS — F19982 Other psychoactive substance use, unspecified with psychoactive substance-induced sleep disorder: Secondary | ICD-10-CM | POA: Diagnosis not present

## 2018-03-08 DIAGNOSIS — Z5111 Encounter for antineoplastic chemotherapy: Secondary | ICD-10-CM | POA: Insufficient documentation

## 2018-03-08 MED ORDER — DACARBAZINE 200 MG IV SOLR
375.0000 mg/m2 | Freq: Once | INTRAVENOUS | Status: AC
  Administered 2018-03-08: 750 mg via INTRAVENOUS
  Filled 2018-03-08: qty 75

## 2018-03-08 MED ORDER — SODIUM CHLORIDE 0.9 % IV SOLN
10.0000 [IU]/m2 | Freq: Once | INTRAVENOUS | Status: AC
  Administered 2018-03-08: 20 [IU] via INTRAVENOUS
  Filled 2018-03-08: qty 6.67

## 2018-03-08 MED ORDER — HEPARIN SOD (PORK) LOCK FLUSH 100 UNIT/ML IV SOLN
500.0000 [IU] | Freq: Once | INTRAVENOUS | Status: AC | PRN
  Administered 2018-03-08: 500 [IU]
  Filled 2018-03-08: qty 5

## 2018-03-08 MED ORDER — SODIUM CHLORIDE 0.9 % IV SOLN
Freq: Once | INTRAVENOUS | Status: AC
  Administered 2018-03-08: 09:00:00 via INTRAVENOUS
  Filled 2018-03-08: qty 5

## 2018-03-08 MED ORDER — LEUPROLIDE ACETATE 7.5 MG IM KIT
7.5000 mg | PACK | Freq: Once | INTRAMUSCULAR | Status: AC
  Administered 2018-03-08: 7.5 mg via INTRAMUSCULAR
  Filled 2018-03-08: qty 7.5

## 2018-03-08 MED ORDER — PALONOSETRON HCL INJECTION 0.25 MG/5ML
INTRAVENOUS | Status: AC
  Filled 2018-03-08: qty 5

## 2018-03-08 MED ORDER — DOXORUBICIN HCL CHEMO IV INJECTION 2 MG/ML
25.0000 mg/m2 | Freq: Once | INTRAVENOUS | Status: AC
  Administered 2018-03-08: 50 mg via INTRAVENOUS
  Filled 2018-03-08: qty 25

## 2018-03-08 MED ORDER — VINBLASTINE SULFATE CHEMO INJECTION 1 MG/ML
6.0000 mg/m2 | Freq: Once | INTRAVENOUS | Status: AC
  Administered 2018-03-08: 12 mg via INTRAVENOUS
  Filled 2018-03-08: qty 12

## 2018-03-08 MED ORDER — SODIUM CHLORIDE 0.9% FLUSH
10.0000 mL | INTRAVENOUS | Status: DC | PRN
  Administered 2018-03-08: 10 mL
  Filled 2018-03-08: qty 10

## 2018-03-08 MED ORDER — SODIUM CHLORIDE 0.9 % IV SOLN
Freq: Once | INTRAVENOUS | Status: AC
  Administered 2018-03-08: 09:00:00 via INTRAVENOUS
  Filled 2018-03-08: qty 250

## 2018-03-08 MED ORDER — PALONOSETRON HCL INJECTION 0.25 MG/5ML
0.2500 mg | Freq: Once | INTRAVENOUS | Status: AC
  Administered 2018-03-08: 0.25 mg via INTRAVENOUS

## 2018-03-08 NOTE — Patient Instructions (Addendum)
[facility] Discharge Instructions for Patients Receiving Chemotherapy  Today you received the following chemotherapy agents adriamycin/bleomycin/vincristine/dacarbazine   To help prevent nausea and vomiting after your treatment, we encourage you to take your nausea medication as directed   NO Zofran (ondansetron) for 3 days after chemo.   May take compazine if needed for nausea for three days after chemo, then can take either zofran or compazine interchangeably.  If you develop nausea and vomiting that is not controlled by your nausea medication, call the clinic.   BELOW ARE SYMPTOMS THAT SHOULD BE REPORTED IMMEDIATELY:  *FEVER GREATER THAN 100.5 F  *CHILLS WITH OR WITHOUT FEVER  NAUSEA AND VOMITING THAT IS NOT CONTROLLED WITH YOUR NAUSEA MEDICATION  *UNUSUAL SHORTNESS OF BREATH  *UNUSUAL BRUISING OR BLEEDING  TENDERNESS IN MOUTH AND THROAT WITH OR WITHOUT PRESENCE OF ULCERS  *URINARY PROBLEMS  *BOWEL PROBLEMS  UNUSUAL RASH Items with * indicate a potential emergency and should be followed up as soon as possible.  Feel free to call the clinic you have any questions or concerns. The clinic phone number is [phone].

## 2018-03-09 ENCOUNTER — Telehealth: Payer: Self-pay | Admitting: *Deleted

## 2018-03-09 NOTE — Telephone Encounter (Signed)
-----   Message from Arty Baumgartner, RN sent at 03/08/2018  2:26 PM EDT ----- Regarding: Alvy Bimler, first chemo  First time ABVD, tolerated well, Dr. Alvy Bimler.

## 2018-03-09 NOTE — Telephone Encounter (Signed)
Called patient for chemo follow up. States she is eating and drinking well. Has been sleeping a lot today, but feels OK. No questions or concerns.

## 2018-03-12 ENCOUNTER — Telehealth: Payer: Self-pay | Admitting: *Deleted

## 2018-03-12 MED ORDER — MAGIC MOUTHWASH W/LIDOCAINE: 5.0000 mL | Freq: Four times a day (QID) | ORAL | 1 refills | Status: DC | PRN

## 2018-03-12 NOTE — Telephone Encounter (Signed)
Received call from pt stating that she is having mouth soreness & ulcers after treatment with ABVD last thurs.  She states that her sister who is a Copywriter, advertising recommended magic mouthwash & has samples & wants to know if this is OK.  She would like to have a script.  She also states that she is having a little trouble with BM's.  She states that she had a small hard BM yest & has been taking Miralax since Thursday without much improvement.  Message routed to Dr Robyne Askew.

## 2018-03-12 NOTE — Telephone Encounter (Signed)
PLease send prescription for Magic Moth wash swish and swallow 4 x a day for 7 days Please advise her on laxatives

## 2018-03-13 ENCOUNTER — Telehealth: Payer: Self-pay | Admitting: Hematology and Oncology

## 2018-03-13 ENCOUNTER — Other Ambulatory Visit: Payer: Self-pay | Admitting: Hematology and Oncology

## 2018-03-13 ENCOUNTER — Telehealth: Payer: Self-pay | Admitting: *Deleted

## 2018-03-13 ENCOUNTER — Encounter: Payer: Self-pay | Admitting: Hematology and Oncology

## 2018-03-13 DIAGNOSIS — R300 Dysuria: Secondary | ICD-10-CM

## 2018-03-13 NOTE — Telephone Encounter (Signed)
Niang states she has burning with urination. Has history of UTIs. Will come Wednesday at 1000 for lab appt. Orders placed. Message to scheduler.

## 2018-03-13 NOTE — Telephone Encounter (Signed)
Patient scheduled per 8/6 sch message. Patient aware of date and time.

## 2018-03-14 ENCOUNTER — Inpatient Hospital Stay: Payer: 59

## 2018-03-14 ENCOUNTER — Telehealth: Payer: Self-pay | Admitting: *Deleted

## 2018-03-14 DIAGNOSIS — Z5111 Encounter for antineoplastic chemotherapy: Secondary | ICD-10-CM | POA: Diagnosis not present

## 2018-03-14 DIAGNOSIS — R300 Dysuria: Secondary | ICD-10-CM

## 2018-03-14 LAB — URINALYSIS, COMPLETE (UACMP) WITH MICROSCOPIC
BILIRUBIN URINE: NEGATIVE
Glucose, UA: NEGATIVE mg/dL
Hgb urine dipstick: NEGATIVE
Ketones, ur: NEGATIVE mg/dL
Leukocytes, UA: NEGATIVE
NITRITE: NEGATIVE
PH: 7 (ref 5.0–8.0)
Protein, ur: NEGATIVE mg/dL
SPECIFIC GRAVITY, URINE: 1.008 (ref 1.005–1.030)

## 2018-03-14 MED ORDER — AMOXICILLIN-POT CLAVULANATE 875-125 MG PO TABS: 1.0000 | ORAL_TABLET | Freq: Two times a day (BID) | ORAL | 0 refills | Status: DC

## 2018-03-14 NOTE — Telephone Encounter (Signed)
Call in Augmentin 875 mg BID PO x 7 days  Do warm compress on her right eye; if her vision is changed she needs to see an eye doctor

## 2018-03-14 NOTE — Telephone Encounter (Signed)
Notified of message below. Verbalized understanding 

## 2018-03-14 NOTE — Telephone Encounter (Signed)
Adrienne [name] left message to say she was so concerned about the possible UTI, she forgot to mention that she has a sore throat, headache, phlegm and woke up with her right eye swollen shut. Her husband has gone to the doctor for similar symptoms.   RN called patient back to see if she has been running any fever- she was at drug store buying a thermometer. Temperature 98.3.

## 2018-03-15 LAB — URINE CULTURE

## 2018-03-16 ENCOUNTER — Encounter: Payer: Self-pay | Admitting: Hematology and Oncology

## 2018-03-16 ENCOUNTER — Inpatient Hospital Stay (HOSPITAL_BASED_OUTPATIENT_CLINIC_OR_DEPARTMENT_OTHER): Payer: 59 | Admitting: Hematology and Oncology

## 2018-03-16 DIAGNOSIS — K1231 Oral mucositis (ulcerative) due to antineoplastic therapy: Secondary | ICD-10-CM | POA: Diagnosis not present

## 2018-03-16 DIAGNOSIS — Z5111 Encounter for antineoplastic chemotherapy: Secondary | ICD-10-CM | POA: Diagnosis not present

## 2018-03-16 DIAGNOSIS — T451X5A Adverse effect of antineoplastic and immunosuppressive drugs, initial encounter: Principal | ICD-10-CM

## 2018-03-16 DIAGNOSIS — C8171 Other classical Hodgkin lymphoma, lymph nodes of head, face, and neck: Secondary | ICD-10-CM

## 2018-03-16 DIAGNOSIS — K5909 Other constipation: Secondary | ICD-10-CM

## 2018-03-16 DIAGNOSIS — R110 Nausea: Secondary | ICD-10-CM

## 2018-03-16 DIAGNOSIS — F19982 Other psychoactive substance use, unspecified with psychoactive substance-induced sleep disorder: Secondary | ICD-10-CM | POA: Diagnosis not present

## 2018-03-16 DIAGNOSIS — C8170 Other classical Hodgkin lymphoma, unspecified site: Secondary | ICD-10-CM

## 2018-03-16 MED ORDER — LORAZEPAM 0.5 MG PO TABS: 0.5000 mg | ORAL_TABLET | Freq: Three times a day (TID) | ORAL | 0 refills | Status: DC | PRN

## 2018-03-16 MED ORDER — DEXAMETHASONE 4 MG PO TABS: 4.0000 mg | ORAL_TABLET | Freq: Two times a day (BID) | ORAL | 0 refills | Status: DC

## 2018-03-16 NOTE — Assessment & Plan Note (Signed)
She had mucositis with recent treatment, resolved with Magic mouthwash with lidocaine Continue conservative management

## 2018-03-16 NOTE — Assessment & Plan Note (Signed)
She has excellent response to treatment with near resolution of her lymphadenopathy Mucositis has resolved Dysuria has resolved She denies peripheral neuropathy Constipation has improved We will proceed with treatment next week without dose adjustment I told the patient the likelihood she will be neutropenic next week but we will proceed regardless of Adrienne [name]

## 2018-03-16 NOTE — Assessment & Plan Note (Signed)
She has insomnia from treatment She can take lorazepam as needed that would also control her nausea

## 2018-03-16 NOTE — Assessment & Plan Note (Signed)
We have extensive discussion about chemotherapy induced nausea management I recommend oral dexamethasone for 2 days after treatment along with antiemetics as prescribed

## 2018-03-16 NOTE — Assessment & Plan Note (Signed)
Likely due to side effects of chemotherapy We discussed aggressive laxative therapy

## 2018-03-16 NOTE — Progress Notes (Signed)
[facility] OFFICE PROGRESS NOTE  Patient Care Team: [name], MD as PCP - General (Family Medicine)  ASSESSMENT & PLAN:  Classical Hodgkin lymphoma [facility]) She has excellent response to treatment with near resolution of her lymphadenopathy Mucositis has resolved Dysuria has resolved She denies peripheral neuropathy Constipation has improved We will proceed with treatment next week without dose adjustment I told the patient the likelihood she will be neutropenic next week but we will proceed regardless of ANC  Chemotherapy-induced nausea We have extensive discussion about chemotherapy induced nausea management I recommend oral dexamethasone for 2 days after treatment along with antiemetics as prescribed   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   INTERVAL HISTORY: Please see below for problem oriented charting. She returns for chemotherapy follow-up Since her last dose of chemotherapy, she has developed nausea, constipation, mucositis and dysuria Constipation has resolved Her nausea has improved with antiemetics Mucositis has resolved with Magic mouthwash Dysuria has resolved with antibiotics therapy Her lymphadenopathy is almost completely disappeared She denies further night sweats, skin itching or skin rash Denies cough, chest pain or shortness of breath No peripheral neuropathy from treatment  SUMMARY OF ONCOLOGIC HISTORY:   Classical Hodgkin lymphoma ([place])   01/22/2018 Imaging    CT scan of neck: Adenopathy throughout the right  lateral neck, right supraclavicular fossa, and upper mediastinum. There is no specific imaging feature, lymphoma or atypical infection are the primary considerations and excisional biopsy should be considered if there is no diagnosis by history or labs.     01/31/2018 Pathology Results    Lymph node for lymphoma, right supraclavicular / right cervical - ATYPICAL LYMPHOID PROLIFERATION. - SEE COMMENT. Microscopic Comment Sections show a few very small needle core biopsy fragments of lymph nodal tissue displaying a polymorphous cellular proliferation of small lymphocytes, plasma cells, eosinophils and histiocytes in addition to variable numbers of large mononuclear and multilobated lymphoid appearing cells with variably prominent nucleoli. Flow cytometric was performed ([id number]) and failed to show any monoclonal B-cell population or abnormal T-cell phenotype. Immunohistochemical stains were performed, including CD15, CD20, CD3, CD30, LCA, and PAX-5 with appropriate controls. The large atypical lymphoid appearing cells are positive for CD15, CD30, CD20 and PAX-5 and negative for CD3 and LCA. The small lymphoid component in the background is mostly composed of T cells. The morphologic and phenotypic features are atypical and highly suspicious for involvement by a lymphoproliferative process, particularly classical Hodgkin lymphoma. Excisional biopsy is recommended.    01/31/2018 Procedure    Ultrasound-guided core biopsies of right cervical lymph nodes.    02/15/2018 Pathology Results    Lymph node for lymphoma, Right Cervical - CLASSICAL HODGKIN LYMPHOMA, NODULAR SCLEROSIS TYPE    03/05/2018 PET scan    1. Hypermetabolic RIGHT level 2 lymph nodes, supraclavicular lymph nodes, sub pectoralis lymph nodes and RIGHT axillary lymph nodes. 2. Hypermetabolic mediastinal lymph nodes. 3. No evidence of lymphoma beneath the diaphragms. Normal spleen. 4. Normal bone marrow.    03/05/2018 Imaging    LV EF:  55% -  60%    03/06/2018 Cancer Staging    Staging form: Hodgkin and  Non-Hodgkin Lymphoma, AJCC 8th Edition - Clinical stage from 03/06/2018: Stage II (Hodgkin lymphoma, B - Symptoms) - Signed by Heath Lark, MD on 03/06/2018    03/08/2018 -  Chemotherapy    The patient had ABVD for chemotherapy treatment.       REVIEW OF SYSTEMS:   Constitutional: Denies fevers, chills or abnormal weight loss Eyes: Denies blurriness of vision Respiratory: Denies cough, dyspnea or wheezes Cardiovascular: Denies palpitation, chest discomfort or lower extremity swelling Skin: Denies abnormal skin rashes Lymphatics: Denies new lymphadenopathy or easy bruising Neurological:Denies numbness, tingling or new weaknesses Behavioral/Psych: Mood is stable, no new changes  All other systems were reviewed with the patient and are negative.  I have reviewed the past medical history, past surgical history, social history and family history with the patient and they are unchanged from previous note.  ALLERGIES:  has No Known Allergies.  MEDICATIONS:  Current Outpatient Medications  Medication Sig Dispense Refill  . allopurinol (ZYLOPRIM) 300 MG tablet Take 1 tablet (300 mg total) by mouth daily. 30 tablet 0  . ALPRAZolam (XANAX) 0.5 MG tablet Take 0.5 mg by mouth 2 (two) times daily as needed.    Marland Kitchen amoxicillin-clavulanate (AUGMENTIN) 875-125 MG tablet Take 1 tablet by mouth 2 (two) times daily. 14 tablet 0  . dexamethasone (DECADRON) 4 MG tablet Take 1 tablet (4 mg total) by mouth 2 (two) times daily with a meal. 60 tablet 0  . ibuprofen (ADVIL,MOTRIN) 800 MG tablet Take 1 tablet (800 mg total) by mouth every 8 (eight) hours as needed. 30 tablet 0  . lidocaine-prilocaine (EMLA) cream Apply 1 application topically as needed. 30 g 6  . LORazepam (ATIVAN) 0.5 MG tablet Take 1 tablet (0.5 mg total) by mouth every 8 (eight) hours as needed for sleep (nausea). 30 tablet 0  . magic mouthwash w/lidocaine SOLN Take 5 mLs by  mouth 4 (four) times daily as needed for mouth pain. 160 mL 1  . ondansetron (ZOFRAN) 8 MG tablet Take 1 tablet (8 mg total) by mouth every 8 (eight) hours as needed. Start on the third day after chemotherapy. 30 tablet 1  . prochlorperazine (COMPAZINE) 10 MG tablet Take 1 tablet (10 mg total) by mouth every 6 (six) hours as needed (Nausea or vomiting). 30 tablet 1   No current facility-administered medications for this visit.     PHYSICAL EXAMINATION: ECOG PERFORMANCE STATUS: 1 - Symptomatic but completely ambulatory  Vitals:   03/16/18 1039  BP: 106/61  Pulse: 63  Resp: 18  Temp: 98 F (36.7 C)  SpO2: 100%   Filed Weights   03/16/18 1039  Weight: 181 lb 12.8 oz (82.5 kg)    GENERAL:alert, no distress and comfortable SKIN: skin color, texture, turgor are normal, no rashes or significant lesions EYES: normal, Conjunctiva are pink and non-injected, sclera clear OROPHARYNX:no exudate, no erythema and lips, buccal mucosa, and tongue normal  NECK: supple, thyroid normal size, non-tender, without nodularity LYMPH: Previously palpable lymphadenopathy has almost completely resolved LUNGS: clear to auscultation and percussion with normal breathing effort HEART: regular rate & rhythm and no murmurs and no lower extremity edema ABDOMEN:abdomen soft, non-tender and normal bowel sounds Musculoskeletal:no cyanosis of digits and no clubbing  NEURO: alert & oriented x 3 with fluent speech, no focal motor/sensory deficits  LABORATORY DATA:  I have reviewed the data as listed    Component Value Date/Time   NA 140 03/06/2018 0754   K 4.0 03/06/2018 0754   CL 103 03/06/2018 0754  CO2 27 03/06/2018 0754   GLUCOSE 91 03/06/2018 0754   BUN 13 03/06/2018 0754   CREATININE 0.78 03/06/2018 0754   CALCIUM 10.2 03/06/2018 0754   PROT 8.5 (H) 03/06/2018 0754   ALBUMIN 3.7 03/06/2018 0754   AST 11 (L) 03/06/2018 0754   ALT 13 03/06/2018 0754   ALKPHOS 102 03/06/2018 0754   BILITOT 0.6  03/06/2018 0754   GFRNONAA >60 03/06/2018 0754   GFRAA >60 03/06/2018 0754    No results found for: SPEP, UPEP  Lab Results  Component Value Date   WBC 10.8 (H) 03/06/2018   NEUTROABS 7.3 (H) 03/06/2018   HGB 12.3 03/06/2018   HCT 38.4 03/06/2018   MCV 87.5 03/06/2018   PLT 333 03/06/2018      Chemistry      Component Value Date/Time   NA 140 03/06/2018 0754   K 4.0 03/06/2018 0754   CL 103 03/06/2018 0754   CO2 27 03/06/2018 0754   BUN 13 03/06/2018 0754   CREATININE 0.78 03/06/2018 0754      Component Value Date/Time   CALCIUM 10.2 03/06/2018 0754   ALKPHOS 102 03/06/2018 0754   AST 11 (L) 03/06/2018 0754   ALT 13 03/06/2018 0754   BILITOT 0.6 03/06/2018 0754       RADIOGRAPHIC STUDIES: I have personally reviewed the radiological images as listed and agreed with the findings in the report. Nm Pet Image Initial (pi) Skull Base To Thigh  Result Date: 03/05/2018 CLINICAL DATA:  Initial treatment strategy for Hodgkin's lymphoma. EXAM: NUCLEAR MEDICINE PET SKULL BASE TO THIGH TECHNIQUE: 8.5 mCi F-18 FDG was injected intravenously. Full-ring PET imaging was performed from the skull base to thigh after the radiotracer. CT data was obtained and used for attenuation correction and anatomic localization. Fasting blood glucose: 86 mg/dl COMPARISON:  None. FINDINGS: Mediastinal blood pool activity: SUV max 1.7 NECK: Hypermetabolic RIGHT level III lymph nodes SUV max equal 10.1. Nodes are bulky and measure up to 2 cm short axis beneath the RIGHT sternocleidomastoid muscle. Nodes extend along the RIGHT thyroid gland to the supraclavicular nodal station. RIGHT supraclavicular nodes with SUV max equal 12.7 are in a masslike conglomerate measuring 3.7 cm. Incidental CT findings: none CHEST: Hypermetabolic RIGHT axillary and sub pectoralis lymph nodes with sub pectoralis lymph node SUV max equal 9.5. Adenopathy extends the mediastinum with hypermetabolic prevascular lymph nodes with SUV max  equal 13.4. Prevascular lymph node measures 1.7 cm short axis. Hypermetabolic subcarinal lymph node which is is the most inferior mediastinal lymph node. Incidental CT findings: No pulmonary nodule ABDOMEN/PELVIS: This spleen is normal size and normal metabolic activity. No hypermetabolic upper abdominal lymph nodes. No hypermetabolic pelvic lymph nodes. No abnormal liver activity. Incidental CT findings: IUD in expected location SKELETON: No abnormal marrow activity. Incidental CT findings: none IMPRESSION: 1. Hypermetabolic RIGHT level 2 lymph nodes, supraclavicular lymph nodes, sub pectoralis lymph nodes and RIGHT axillary lymph nodes. 2. Hypermetabolic mediastinal lymph nodes. 3. No evidence of lymphoma beneath the diaphragms.  Normal spleen. 4. Normal bone marrow. Electronically Signed   By: Suzy Bouchard M.D.   On: 03/05/2018 14:18   Dg Chest Port 1 View  Result Date: 03/07/2018 CLINICAL DATA:  Status post Port-A-Cath insertion. EXAM: PORTABLE CHEST 1 VIEW COMPARISON:  None. FINDINGS: The cardiomediastinal silhouette is unremarkable. A LEFT IJ Port-A-Cath is noted with tip overlying the SUPERIOR cavoatrial junction. There is no evidence of focal airspace disease, pulmonary edema, suspicious pulmonary nodule/mass, pleural effusion, or pneumothorax. No acute bony  abnormalities are identified. IMPRESSION: LEFT IJ Port-A-Cath with tip overlying the SUPERIOR cavoatrial junction. No evidence of pneumothorax. Electronically Signed   By: Margarette Canada M.D.   On: 03/07/2018 11:07   Dg Fluoro Guide Cv Line-no Report  Result Date: 03/07/2018 Fluoroscopy was utilized by the requesting physician.  No radiographic interpretation.    All questions were answered. The patient knows to call the clinic with any problems, questions or concerns. No barriers to learning was detected.  I spent 15 minutes counseling the patient face to face. The total time spent in the appointment was 20 minutes and more than 50% was on  counseling and review of test results  Heath Lark, MD 03/16/2018 2:31 PM

## 2018-03-20 ENCOUNTER — Encounter: Payer: Self-pay | Admitting: Pharmacy Technician

## 2018-03-20 NOTE — Progress Notes (Signed)
The patient is approved for drug assistance by [employer] for Lupron injections. Enrollment is based on insurance denial and is active until 03/20/19. Drug replacement will begin DOS 03/08/18.

## 2018-03-22 ENCOUNTER — Inpatient Hospital Stay: Payer: 59

## 2018-03-22 VITALS — BP 110/76 | HR 80 | Temp 97.8°F | Resp 18

## 2018-03-22 DIAGNOSIS — C8170 Other classical Hodgkin lymphoma, unspecified site: Secondary | ICD-10-CM

## 2018-03-22 DIAGNOSIS — Z5111 Encounter for antineoplastic chemotherapy: Secondary | ICD-10-CM | POA: Diagnosis not present

## 2018-03-22 DIAGNOSIS — Z3162 Encounter for fertility preservation counseling: Secondary | ICD-10-CM

## 2018-03-22 LAB — COMPREHENSIVE METABOLIC PANEL
ALT: 24 U/L (ref 0–44)
ANION GAP: 10 (ref 5–15)
AST: 19 U/L (ref 15–41)
Albumin: 3.9 g/dL (ref 3.5–5.0)
Alkaline Phosphatase: 91 U/L (ref 38–126)
BUN: 11 mg/dL (ref 6–20)
CALCIUM: 9.6 mg/dL (ref 8.9–10.3)
CHLORIDE: 107 mmol/L (ref 98–111)
CO2: 25 mmol/L (ref 22–32)
Creatinine, Ser: 0.79 mg/dL (ref 0.44–1.00)
GFR calc non Af Amer: >60 mL/min (ref >60)
Glucose, Bld: 97 mg/dL (ref 70–99)
Potassium: 3.9 mmol/L (ref 3.5–5.1)
SODIUM: 142 mmol/L (ref 135–145)
Total Bilirubin: 0.4 mg/dL (ref 0.3–1.2)
Total Protein: 7.8 g/dL (ref 6.5–8.1)

## 2018-03-22 LAB — CBC WITH DIFFERENTIAL/PLATELET
BASOS PCT: 2 %
Basophils Absolute: 0.1 10*3/uL (ref 0.0–0.1)
Eosinophils Absolute: 0.4 10*3/uL (ref 0.0–0.5)
Eosinophils Relative: 13 %
HEMATOCRIT: 37.1 % (ref 34.8–46.6)
HEMOGLOBIN: 12.1 g/dL (ref 11.6–15.9)
LYMPHS ABS: 1.8 10*3/uL (ref 0.9–3.3)
Lymphocytes Relative: 57 %
MCH: 28.1 pg (ref 25.1–34.0)
MCHC: 32.6 g/dL (ref 31.5–36.0)
MCV: 86.3 fL (ref 79.5–101.0)
Monocytes Absolute: 0.6 10*3/uL (ref 0.1–0.9)
Monocytes Relative: 18 %
NEUTROS ABS: 0.3 10*3/uL — AB (ref 1.5–6.5)
NEUTROS PCT: 10 %
Platelets: 279 10*3/uL (ref 145–400)
RBC: 4.3 MIL/uL (ref 3.70–5.45)
RDW: 13 % (ref 11.2–14.5)
WBC: 3.2 10*3/uL — AB (ref 3.9–10.3)

## 2018-03-22 LAB — PREGNANCY, URINE: Preg Test, Ur: NEGATIVE

## 2018-03-22 MED ORDER — SODIUM CHLORIDE 0.9 % IV SOLN
Freq: Once | INTRAVENOUS | Status: AC
  Administered 2018-03-22: 09:00:00 via INTRAVENOUS
  Filled 2018-03-22: qty 5

## 2018-03-22 MED ORDER — SODIUM CHLORIDE 0.9% FLUSH
10.0000 mL | INTRAVENOUS | Status: DC | PRN
  Administered 2018-03-22: 10 mL
  Filled 2018-03-22: qty 10

## 2018-03-22 MED ORDER — SODIUM CHLORIDE 0.9 % IV SOLN
375.0000 mg/m2 | Freq: Once | INTRAVENOUS | Status: AC
  Administered 2018-03-22: 750 mg via INTRAVENOUS
  Filled 2018-03-22: qty 75

## 2018-03-22 MED ORDER — PALONOSETRON HCL INJECTION 0.25 MG/5ML
0.2500 mg | Freq: Once | INTRAVENOUS | Status: AC
  Administered 2018-03-22: 0.25 mg via INTRAVENOUS

## 2018-03-22 MED ORDER — PALONOSETRON HCL INJECTION 0.25 MG/5ML
INTRAVENOUS | Status: AC
  Filled 2018-03-22: qty 5

## 2018-03-22 MED ORDER — SODIUM CHLORIDE 0.9 % IV SOLN
10.0000 [IU]/m2 | Freq: Once | INTRAVENOUS | Status: AC
  Administered 2018-03-22: 20 [IU] via INTRAVENOUS
  Filled 2018-03-22: qty 6.67

## 2018-03-22 MED ORDER — SODIUM CHLORIDE 0.9 % IV SOLN
Freq: Once | INTRAVENOUS | Status: AC
  Administered 2018-03-22: 09:00:00 via INTRAVENOUS
  Filled 2018-03-22: qty 250

## 2018-03-22 MED ORDER — DOXORUBICIN HCL CHEMO IV INJECTION 2 MG/ML
25.0000 mg/m2 | Freq: Once | INTRAVENOUS | Status: AC
  Administered 2018-03-22: 50 mg via INTRAVENOUS
  Filled 2018-03-22: qty 25

## 2018-03-22 MED ORDER — SODIUM CHLORIDE 0.9% FLUSH
10.0000 mL | Freq: Once | INTRAVENOUS | Status: AC
  Administered 2018-03-22: 10 mL
  Filled 2018-03-22: qty 10

## 2018-03-22 MED ORDER — HEPARIN SOD (PORK) LOCK FLUSH 100 UNIT/ML IV SOLN
500.0000 [IU] | Freq: Once | INTRAVENOUS | Status: AC | PRN
  Administered 2018-03-22: 500 [IU]
  Filled 2018-03-22: qty 5

## 2018-03-22 MED ORDER — VINBLASTINE SULFATE CHEMO INJECTION 1 MG/ML
6.0000 mg/m2 | Freq: Once | INTRAVENOUS | Status: AC
  Administered 2018-03-22: 12 mg via INTRAVENOUS
  Filled 2018-03-22: qty 12

## 2018-03-22 NOTE — Progress Notes (Signed)
Per treatment plan orders, proceed with treatment regardless of ANC. Neutropenic precautions reviewed with patient in detail. Verbalized understanding.

## 2018-03-22 NOTE — Patient Instructions (Addendum)
[facility] Discharge Instructions for Patients Receiving Chemotherapy  Today you received the following chemotherapy agents ABVD.  To help prevent nausea and vomiting after your treatment, we encourage you to take your nausea medication as directed. If you develop nausea and vomiting that is not controlled by your nausea medication, call the clinic.   BELOW ARE SYMPTOMS THAT SHOULD BE REPORTED IMMEDIATELY:  *FEVER GREATER THAN 100.5 F  *CHILLS WITH OR WITHOUT FEVER  NAUSEA AND VOMITING THAT IS NOT CONTROLLED WITH YOUR NAUSEA MEDICATION  *UNUSUAL SHORTNESS OF BREATH  *UNUSUAL BRUISING OR BLEEDING  TENDERNESS IN MOUTH AND THROAT WITH OR WITHOUT PRESENCE OF ULCERS  *URINARY PROBLEMS  *BOWEL PROBLEMS  UNUSUAL RASH Items with * indicate a potential emergency and should be followed up as soon as possible.  Feel free to call the clinic should you have any questions or concerns. The clinic phone number is [phone].  Please show the [facility] at check-in to the Emergency Department and triage nurse.   Neutropenia Neutropenia is a condition that occurs when you have a lower-than-normal level of a type of white blood cell (neutrophil) in your body. Neutrophils are made in the spongy center of large bones (bone marrow) and they fight infections. Neutrophils are your body's main defense against bacterial and fungal infections. The fewer neutrophils you have and the longer your body remains without them, the greater your risk of getting a severe infection. What are the causes? This condition can occur if your body uses up or destroys neutrophils faster than your bone marrow can make them. This problem may happen because of:  Bacterial or fungal infection.  Allergic disorders.  Reactions to some medicines.  Autoimmune disease.  An enlarged spleen.  This condition can also occur if your bone marrow does not produce enough neutrophils. This problem may be  caused by:  Cancer.  Cancer treatments, such as radiation or chemotherapy.  Viral infections.  Medicines, such as phenytoin.  Vitamin B12 deficiency.  Diseases of the bone marrow.  Environmental toxins, such as insecticides.  What are the signs or symptoms? This condition does not usually cause symptoms. If symptoms are present, they are usually caused by an underlying infection. Symptoms of an infection may include:  Fever.  Chills.  Swollen glands.  Oral or anal ulcers.  Cough and shortness of breath.  Rash.  Skin infection.  Fatigue.  How is this diagnosed? Your health care provider may suspect neutropenia if you have:  A condition that may cause neutropenia.  Symptoms of infection, especially fever.  Frequent and unusual infections.  You will have a medical history and physical exam. Tests will also be done, such as:  A complete blood count (CBC).  A procedure to collect a sample of bone marrow for examination (bone marrow biopsy).  A chest X-ray.  A urine culture.  A blood culture.  How is this treated? Treatment depends on the underlying cause and severity of your condition. Mild neutropenia may not require treatment. Treatment may include medicines, such as:  Antibiotic medicine given through an IV tube.  Antiviral medicines.  Antifungal medicines.  A medicine to increase neutrophil production (colony-stimulating factor). You may get this drug through an IV tube or by injection.  Steroids given through an IV tube.  If an underlying condition is causing neutropenia, you may need treatment for that condition. If medicines you are taking are causing neutropenia, your health care provider may have you stop taking those medicines. Follow  these instructions at home: Medicines  Take over-the-counter and prescription medicines only as told by your health care provider.  Get a seasonal flu shot (influenza vaccine). Lifestyle  Do not eat  unpasteurized foods.Do not eat unwashed raw fruits or vegetables.  Avoid exposure to groups of people or children.  Avoid being around people who are sick.  Avoid being around dirt or dust, such as in construction areas or gardens.  Do not provide direct care for pets. Avoid animal droppings. Do not clean litter boxes and bird cages. Hygiene   Bathe daily.  Clean the area between the genitals and the anus (perineal area) after you urinate or have a bowel movement. If you are female, wipe from front to back.  Brush your teeth with a soft toothbrush before and after meals.  Do not use a razor that has a blade. Use an electric razor to remove hair.  Wash your hands often. Make sure others who come in contact with you also wash their hands. If soap and water are not available, use hand sanitizer. General instructions  Do not have sex unless your health care provider has approved.  Take actions to avoid cuts and burns. For example: ? Be cautious when you use knives. Always cut away from yourself. ? Keep knives in protective sheaths or guards when not in use. ? Use oven mitts when you cook with a hot stove, oven, or grill. ? Stand a safe distance away from open fires.  Avoid people who received a vaccine in the past 30 days if that vaccine contained a live version of the germ (live vaccine). You should not get a live vaccine. Common live vaccines are varicella, measles, mumps, and rubella.  Do not share food utensils.  Do not use tampons, enemas, or rectal suppositories unless your health care provider has approved.  Keep all appointments as told by your health care provider. This is important. Contact a health care provider if:  You have a fever.  You have chills or you start to shake.  You have: ? A sore throat. ? A warm, red, or tender area on your skin. ? A cough. ? Frequent or painful urination. ? Vaginal discharge or itching.  You develop: ? Sores in your mouth or  anus. ? Swollen lymph nodes. ? Red streaks on the skin. ? A rash.  You feel: ? Nauseous or you vomit. ? Very fatigued. ? Short of breath. This information is not intended to replace advice given to you by your health care provider. Make sure you discuss any questions you have with your health care provider. Document Released: 01/14/2002 Document Revised: 12/31/2015 Document Reviewed: 02/04/2015 Elsevier Interactive Patient Education  [employer].

## 2018-03-26 ENCOUNTER — Telehealth: Payer: Self-pay | Admitting: Hematology and Oncology

## 2018-03-26 ENCOUNTER — Inpatient Hospital Stay: Payer: 59

## 2018-03-26 ENCOUNTER — Telehealth: Payer: Self-pay | Admitting: *Deleted

## 2018-03-26 ENCOUNTER — Inpatient Hospital Stay (HOSPITAL_BASED_OUTPATIENT_CLINIC_OR_DEPARTMENT_OTHER): Payer: 59 | Admitting: Medical

## 2018-03-26 VITALS — BP 105/65 | HR 55 | Temp 98.2°F | Resp 18

## 2018-03-26 DIAGNOSIS — H65192 Other acute nonsuppurative otitis media, left ear: Secondary | ICD-10-CM

## 2018-03-26 DIAGNOSIS — H9209 Otalgia, unspecified ear: Secondary | ICD-10-CM

## 2018-03-26 DIAGNOSIS — Z5111 Encounter for antineoplastic chemotherapy: Secondary | ICD-10-CM | POA: Diagnosis not present

## 2018-03-26 LAB — CMP (CANCER CENTER ONLY)
ALT: 24 U/L (ref 0–44)
ANION GAP: 7 (ref 5–15)
AST: 14 U/L — ABNORMAL LOW (ref 15–41)
Albumin: 3.8 g/dL (ref 3.5–5.0)
Alkaline Phosphatase: 71 U/L (ref 38–126)
BILIRUBIN TOTAL: 1 mg/dL (ref 0.3–1.2)
BUN: 18 mg/dL (ref 6–20)
CO2: 26 mmol/L (ref 22–32)
Calcium: 9.2 mg/dL (ref 8.9–10.3)
Chloride: 105 mmol/L (ref 98–111)
Creatinine: 0.76 mg/dL (ref 0.44–1.00)
Glucose, Bld: 81 mg/dL (ref 70–99)
POTASSIUM: 3.7 mmol/L (ref 3.5–5.1)
Sodium: 138 mmol/L (ref 135–145)
TOTAL PROTEIN: 7.4 g/dL (ref 6.5–8.1)

## 2018-03-26 LAB — CBC WITH DIFFERENTIAL (CANCER CENTER ONLY)
Basophils Absolute: 0 10*3/uL (ref 0.0–0.1)
Basophils Relative: 1 %
EOS PCT: 5 %
Eosinophils Absolute: 0.2 10*3/uL (ref 0.0–0.5)
HEMATOCRIT: 37.1 % (ref 34.8–46.6)
Hemoglobin: 12.2 g/dL (ref 11.6–15.9)
LYMPHS PCT: 41 %
Lymphs Abs: 1.5 10*3/uL (ref 0.9–3.3)
MCH: 28.2 pg (ref 25.1–34.0)
MCHC: 32.9 g/dL (ref 31.5–36.0)
MCV: 85.7 fL (ref 79.5–101.0)
MONO ABS: 0 10*3/uL — AB (ref 0.1–0.9)
Monocytes Relative: 1 %
NEUTROS ABS: 2 10*3/uL (ref 1.5–6.5)
Neutrophils Relative %: 52 %
PLATELETS: 181 10*3/uL (ref 145–400)
RBC: 4.33 MIL/uL (ref 3.70–5.45)
RDW: 12.9 % (ref 11.2–14.5)
WBC Count: 3.8 10*3/uL — ABNORMAL LOW (ref 3.9–10.3)

## 2018-03-26 MED ORDER — AZITHROMYCIN 250 MG PO TABS: ORAL_TABLET | ORAL | 0 refills | Status: DC

## 2018-03-26 MED ORDER — FLUCONAZOLE 150 MG PO TABS: ORAL_TABLET | ORAL | 0 refills | Status: DC

## 2018-03-26 NOTE — Telephone Encounter (Signed)
Spoke to patient regarding upcoming aug appts per 8/19 sch message  °

## 2018-03-26 NOTE — Patient Instructions (Signed)

## 2018-03-26 NOTE — Progress Notes (Signed)
Pt presents with headache and feeling of fullness in her ears.  Denies cough or CP/SOB.  Afebrile.  Denies chills.  Reports gen fatigue.

## 2018-03-26 NOTE — Telephone Encounter (Signed)
TC from patient stating that she had chemo last Thursday (Velban, bleo and DTIC). She now c/o sore throat and bilateral ear pain. Denies fever or chills.  She is asking to be seen today. Will set her up for labs and [facility] today.

## 2018-03-26 NOTE — Progress Notes (Unsigned)
Duplicate encounter

## 2018-03-27 NOTE — Progress Notes (Signed)
Duplicate encounter

## 2018-03-28 ENCOUNTER — Telehealth: Payer: Self-pay | Admitting: *Deleted

## 2018-03-28 NOTE — Telephone Encounter (Signed)
Notified of message below

## 2018-03-28 NOTE — Telephone Encounter (Signed)
Her symptoms are from chemo From Lupron, just mood swings, fluid retention and hot flashes

## 2018-03-28 NOTE — Telephone Encounter (Signed)
Adrienne [name] left a message stating she had her Lupron injection on 8/15. Has started having  "full body aches and general unwell feeling". Wants to know if that is due to the chemo or is it the beginning of menopause from the Lupron. Is not sure what side effects she should expect from the Lupron.

## 2018-04-05 ENCOUNTER — Inpatient Hospital Stay (HOSPITAL_BASED_OUTPATIENT_CLINIC_OR_DEPARTMENT_OTHER): Payer: 59 | Admitting: Hematology and Oncology

## 2018-04-05 ENCOUNTER — Inpatient Hospital Stay: Payer: 59

## 2018-04-05 ENCOUNTER — Telehealth: Payer: Self-pay | Admitting: Hematology and Oncology

## 2018-04-05 VITALS — BP 105/62 | HR 85 | Temp 97.8°F | Resp 18 | Ht 69.0 in | Wt 183.6 lb

## 2018-04-05 DIAGNOSIS — F19982 Other psychoactive substance use, unspecified with psychoactive substance-induced sleep disorder: Secondary | ICD-10-CM | POA: Diagnosis not present

## 2018-04-05 DIAGNOSIS — F419 Anxiety disorder, unspecified: Secondary | ICD-10-CM

## 2018-04-05 DIAGNOSIS — R110 Nausea: Secondary | ICD-10-CM

## 2018-04-05 DIAGNOSIS — C8170 Other classical Hodgkin lymphoma, unspecified site: Secondary | ICD-10-CM

## 2018-04-05 DIAGNOSIS — Z3162 Encounter for fertility preservation counseling: Secondary | ICD-10-CM

## 2018-04-05 DIAGNOSIS — K5909 Other constipation: Secondary | ICD-10-CM

## 2018-04-05 DIAGNOSIS — C8171 Other classical Hodgkin lymphoma, lymph nodes of head, face, and neck: Secondary | ICD-10-CM | POA: Diagnosis not present

## 2018-04-05 DIAGNOSIS — K1231 Oral mucositis (ulcerative) due to antineoplastic therapy: Secondary | ICD-10-CM

## 2018-04-05 DIAGNOSIS — Z5111 Encounter for antineoplastic chemotherapy: Secondary | ICD-10-CM | POA: Diagnosis not present

## 2018-04-05 DIAGNOSIS — T451X5A Adverse effect of antineoplastic and immunosuppressive drugs, initial encounter: Secondary | ICD-10-CM

## 2018-04-05 DIAGNOSIS — M898X9 Other specified disorders of bone, unspecified site: Secondary | ICD-10-CM

## 2018-04-05 DIAGNOSIS — D701 Agranulocytosis secondary to cancer chemotherapy: Secondary | ICD-10-CM

## 2018-04-05 LAB — COMPREHENSIVE METABOLIC PANEL
ALBUMIN: 4.1 g/dL (ref 3.5–5.0)
ALK PHOS: 73 U/L (ref 38–126)
ALT: 24 U/L (ref 0–44)
AST: 21 U/L (ref 15–41)
Anion gap: 9 (ref 5–15)
BILIRUBIN TOTAL: 0.4 mg/dL (ref 0.3–1.2)
BUN: 14 mg/dL (ref 6–20)
CALCIUM: 9.7 mg/dL (ref 8.9–10.3)
CO2: 27 mmol/L (ref 22–32)
CREATININE: 0.76 mg/dL (ref 0.44–1.00)
Chloride: 107 mmol/L (ref 98–111)
GFR calc Af Amer: >60 mL/min (ref >60)
GLUCOSE: 96 mg/dL (ref 70–99)
Potassium: 4 mmol/L (ref 3.5–5.1)
Sodium: 143 mmol/L (ref 135–145)
TOTAL PROTEIN: 7.4 g/dL (ref 6.5–8.1)

## 2018-04-05 LAB — CBC WITH DIFFERENTIAL/PLATELET
BASOS ABS: 0.1 10*3/uL (ref 0.0–0.1)
BASOS PCT: 3 %
EOS ABS: 0.2 10*3/uL (ref 0.0–0.5)
Eosinophils Relative: 7 %
HEMATOCRIT: 35.9 % (ref 34.8–46.6)
HEMOGLOBIN: 11.9 g/dL (ref 11.6–15.9)
Lymphocytes Relative: 59 %
Lymphs Abs: 1.5 10*3/uL (ref 0.9–3.3)
MCH: 28.3 pg (ref 25.1–34.0)
MCHC: 33.2 g/dL (ref 31.5–36.0)
MCV: 85.1 fL (ref 79.5–101.0)
MONOS PCT: 17 %
Monocytes Absolute: 0.5 10*3/uL (ref 0.1–0.9)
NEUTROS ABS: 0.4 10*3/uL — AB (ref 1.5–6.5)
NEUTROS PCT: 14 %
Platelets: 329 10*3/uL (ref 145–400)
RBC: 4.22 MIL/uL (ref 3.70–5.45)
RDW: 13.7 % (ref 11.2–14.5)
WBC: 2.7 10*3/uL — ABNORMAL LOW (ref 3.9–10.3)

## 2018-04-05 LAB — PREGNANCY, URINE: PREG TEST UR: NEGATIVE

## 2018-04-05 MED ORDER — LEUPROLIDE ACETATE 7.5 MG IM KIT
7.5000 mg | PACK | Freq: Once | INTRAMUSCULAR | Status: AC
  Administered 2018-04-05: 7.5 mg via INTRAMUSCULAR
  Filled 2018-04-05: qty 7.5

## 2018-04-05 MED ORDER — HEPARIN SOD (PORK) LOCK FLUSH 100 UNIT/ML IV SOLN
500.0000 [IU] | Freq: Once | INTRAVENOUS | Status: AC | PRN
  Administered 2018-04-05: 500 [IU]
  Filled 2018-04-05: qty 5

## 2018-04-05 MED ORDER — VINBLASTINE SULFATE CHEMO INJECTION 1 MG/ML
3.0000 mg/m2 | Freq: Once | INTRAVENOUS | Status: AC
  Administered 2018-04-05: 6 mg via INTRAVENOUS
  Filled 2018-04-05: qty 6

## 2018-04-05 MED ORDER — DOXORUBICIN HCL CHEMO IV INJECTION 2 MG/ML
25.0000 mg/m2 | Freq: Once | INTRAVENOUS | Status: AC
  Administered 2018-04-05: 50 mg via INTRAVENOUS
  Filled 2018-04-05: qty 25

## 2018-04-05 MED ORDER — PALONOSETRON HCL INJECTION 0.25 MG/5ML
INTRAVENOUS | Status: AC
  Filled 2018-04-05: qty 5

## 2018-04-05 MED ORDER — SODIUM CHLORIDE 0.9% FLUSH
10.0000 mL | INTRAVENOUS | Status: DC | PRN
  Administered 2018-04-05: 10 mL
  Filled 2018-04-05: qty 10

## 2018-04-05 MED ORDER — SODIUM CHLORIDE 0.9% FLUSH
10.0000 mL | Freq: Once | INTRAVENOUS | Status: AC
  Administered 2018-04-05: 10 mL
  Filled 2018-04-05: qty 10

## 2018-04-05 MED ORDER — SODIUM CHLORIDE 0.9 % IV SOLN
Freq: Once | INTRAVENOUS | Status: AC
  Administered 2018-04-05: 13:00:00 via INTRAVENOUS
  Filled 2018-04-05: qty 5

## 2018-04-05 MED ORDER — PALONOSETRON HCL INJECTION 0.25 MG/5ML
0.2500 mg | Freq: Once | INTRAVENOUS | Status: AC
  Administered 2018-04-05: 0.25 mg via INTRAVENOUS

## 2018-04-05 MED ORDER — SODIUM CHLORIDE 0.9 % IV SOLN
Freq: Once | INTRAVENOUS | Status: AC
  Administered 2018-04-05: 13:00:00 via INTRAVENOUS
  Filled 2018-04-05: qty 250

## 2018-04-05 MED ORDER — SODIUM CHLORIDE 0.9 % IV SOLN
375.0000 mg/m2 | Freq: Once | INTRAVENOUS | Status: AC
  Administered 2018-04-05: 750 mg via INTRAVENOUS
  Filled 2018-04-05: qty 75

## 2018-04-05 MED ORDER — SODIUM CHLORIDE 0.9 % IV SOLN
10.0000 [IU]/m2 | Freq: Once | INTRAVENOUS | Status: AC
  Administered 2018-04-05: 20 [IU] via INTRAVENOUS
  Filled 2018-04-05: qty 6.67

## 2018-04-05 MED ORDER — LORAZEPAM 1 MG PO TABS: 1.0000 mg | ORAL_TABLET | Freq: Three times a day (TID) | ORAL | 0 refills | Status: DC | PRN

## 2018-04-05 MED ORDER — LEUPROLIDE ACETATE (3 MONTH) 22.5 MG IM KIT
PACK | INTRAMUSCULAR | Status: AC
  Filled 2018-04-05: qty 22.5

## 2018-04-05 MED ORDER — TRAMADOL HCL 50 MG PO TABS: 50.0000 mg | ORAL_TABLET | Freq: Four times a day (QID) | ORAL | 0 refills | Status: DC | PRN

## 2018-04-05 NOTE — Patient Instructions (Signed)
[facility] Discharge Instructions for Patients Receiving Chemotherapy  Today you received the following chemotherapy agents: Adriamycin, Vinblastine, Bleomycin, Dacarbazine  To help prevent nausea and vomiting after your treatment, we encourage you to take your nausea medication as directed.   If you develop nausea and vomiting that is not controlled by your nausea medication, call the clinic.   BELOW ARE SYMPTOMS THAT SHOULD BE REPORTED IMMEDIATELY:  *FEVER GREATER THAN 100.5 F  *CHILLS WITH OR WITHOUT FEVER  NAUSEA AND VOMITING THAT IS NOT CONTROLLED WITH YOUR NAUSEA MEDICATION  *UNUSUAL SHORTNESS OF BREATH  *UNUSUAL BRUISING OR BLEEDING  TENDERNESS IN MOUTH AND THROAT WITH OR WITHOUT PRESENCE OF ULCERS  *URINARY PROBLEMS  *BOWEL PROBLEMS  UNUSUAL RASH Items with * indicate a potential emergency and should be followed up as soon as possible.  Feel free to call the clinic should you have any questions or concerns. The clinic phone number is [phone].  Please show the [place] at check-in to the Emergency Department and triage nurse.

## 2018-04-05 NOTE — Progress Notes (Signed)
FMLA for spouse, Ellowyn Rieves, successfully faxed to [place] at [phone]. Mailed copy to patient address on file.

## 2018-04-05 NOTE — Telephone Encounter (Signed)
Gave patient avs and calendar.   °

## 2018-04-06 ENCOUNTER — Encounter: Payer: Self-pay | Admitting: Hematology and Oncology

## 2018-04-06 DIAGNOSIS — D701 Agranulocytosis secondary to cancer chemotherapy: Secondary | ICD-10-CM | POA: Insufficient documentation

## 2018-04-06 DIAGNOSIS — M898X9 Other specified disorders of bone, unspecified site: Secondary | ICD-10-CM | POA: Insufficient documentation

## 2018-04-06 DIAGNOSIS — T451X5A Adverse effect of antineoplastic and immunosuppressive drugs, initial encounter: Secondary | ICD-10-CM

## 2018-04-06 NOTE — Assessment & Plan Note (Signed)
This is related to side effects of therapy Continue conservative management

## 2018-04-06 NOTE — Assessment & Plan Note (Signed)
This is related to vinblastine and side effects of antiemetics We discussed the importance of laxative and a plan to reduce the dose of treatment a little bit

## 2018-04-06 NOTE — Assessment & Plan Note (Signed)
She has experienced significant side effects including diffuse body aches, less nausea, recurrent mucositis, constipation and generalized fatigue with each cycle of treatment The patient has mild neutropenia but otherwise asymptomatic I plan to reduce the dose of vinblastine a little bit After 4 doses of chemo, I plan to repeat PET CT scan for objective response to therapy If she has excellent response to treatment, I plan to drop bleomycin I will continue to see her before each doses for supportive care and toxicity review

## 2018-04-06 NOTE — Assessment & Plan Note (Signed)
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

## 2018-04-06 NOTE — Assessment & Plan Note (Signed)
This is related to side effects of steroid I recommend the use of anxiolytics

## 2018-04-06 NOTE — Assessment & Plan Note (Signed)
She continues to have intermittent bone pain after each dose of treatment I prescribed tramadol and recommend over-the-counter analgesics as needed

## 2018-04-06 NOTE — Assessment & Plan Note (Signed)
This is improved with addition of dexamethasone Unfortunately, dexamethasone has also caused significant insomnia I recommend daily reduced dose dexamethasone in the morning for 2 days after each cycle of treatment along with antiemetics as needed

## 2018-04-06 NOTE — Progress Notes (Signed)
[place] OFFICE PROGRESS NOTE  Patient Care Team: [name], MD as PCP - General (Family Medicine)  ASSESSMENT & PLAN:  Classical Hodgkin lymphoma [facility]) She has experienced significant side effects including diffuse body aches, less nausea, recurrent mucositis, constipation and generalized fatigue with each cycle of treatment The patient has mild neutropenia but otherwise asymptomatic I plan to reduce the dose of vinblastine a little bit After 4 doses of chemo, I plan to repeat PET CT scan for objective response to therapy If she has excellent response to treatment, I plan to drop bleomycin I will continue to see her before each doses for supportive care and toxicity review   Chemotherapy-induced nausea This is improved with addition of dexamethasone Unfortunately, dexamethasone has also caused significant insomnia I recommend daily reduced dose dexamethasone in the morning for 2 days after each cycle of treatment along with antiemetics as needed  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[facility]) This is related to side effects of steroid I recommend the use of anxiolytics  Mucositis due to antineoplastic therapy This is related to side effects of therapy Continue conservative management  Other constipation This is related to vinblastine and side effects of antiemetics We discussed the importance of laxative and a plan to reduce the dose of treatment a little bit  Chemotherapy induced neutropenia ([place]) This is an expected side effects of treatment She is not symptomatic I shared with her data in regards to not using G-CSF support We will proceed regardless of ANC  Bone pain She continues to have intermittent bone pain after each dose of treatment I prescribed tramadol and recommend over-the-counter analgesics as needed  Anxiety She has generalized anxiety secondary to her disease I have refilled her prescription lorazepam and warned her about potential side  effects   Orders Placed This Encounter  Procedures  . NM PET Image Restag (PS) Skull Base To Thigh    Standing Status:   Future    Standing Expiration Date:   04/06/2019    Order Specific Question:   If indicated for the ordered procedure, I authorize the administration of a radiopharmaceutical per Radiology protocol    Answer:   Yes    Order Specific Question:   Preferred imaging location?    Answer:   [facility]    Order Specific Question:   Radiology Contrast Protocol - do NOT remove file path    Answer:   [email or web address]    Order Specific Question:   Is the patient pregnant?    Answer:   No    INTERVAL HISTORY: Please see below for problem oriented charting. She returns with her husband for further follow-up She continues to experience profound side effects of treatment She had diffuse body aches, mucositis, constipation, mild persistent nausea and feeling fatigue Her husband has requested FMLA paperwork to be filled to help assist in the patient's activities of daily living She denies fever or chills Denies recurrence of skin rashes No recurrence of lymphadenopathy Her nausea has improved with addition of dexamethasone but that has caused insomnia She continues to experience significant anxiety but lorazepam has helped  SUMMARY OF ONCOLOGIC HISTORY:   Classical Hodgkin lymphoma ([place])   01/22/2018 Imaging    CT scan of neck: Adenopathy throughout the right lateral neck, right supraclavicular fossa, and upper mediastinum. There is no specific imaging feature, lymphoma or atypical infection are the primary considerations and excisional biopsy should be considered if there is no diagnosis by history or labs.  01/31/2018 Pathology Results    Lymph node for lymphoma, right supraclavicular / right cervical - ATYPICAL LYMPHOID PROLIFERATION. - SEE COMMENT. Microscopic Comment Sections show a few very small needle core biopsy fragments of  lymph nodal tissue displaying a polymorphous cellular proliferation of small lymphocytes, plasma cells, eosinophils and histiocytes in addition to variable numbers of large mononuclear and multilobated lymphoid appearing cells with variably prominent nucleoli. Flow cytometric was performed ([id number]) and failed to show any monoclonal B-cell population or abnormal T-cell phenotype. Immunohistochemical stains were performed, including CD15, CD20, CD3, CD30, LCA, and PAX-5 with appropriate controls. The large atypical lymphoid appearing cells are positive for CD15, CD30, CD20 and PAX-5 and negative for CD3 and LCA. The small lymphoid component in the background is mostly composed of T cells. The morphologic and phenotypic features are atypical and highly suspicious for involvement by a lymphoproliferative process, particularly classical Hodgkin lymphoma. Excisional biopsy is recommended.    01/31/2018 Procedure    Ultrasound-guided core biopsies of right cervical lymph nodes.    02/15/2018 Pathology Results    Lymph node for lymphoma, Right Cervical - CLASSICAL HODGKIN LYMPHOMA, NODULAR SCLEROSIS TYPE    03/05/2018 PET scan    1. Hypermetabolic RIGHT level 2 lymph nodes, supraclavicular lymph nodes, sub pectoralis lymph nodes and RIGHT axillary lymph nodes. 2. Hypermetabolic mediastinal lymph nodes. 3. No evidence of lymphoma beneath the diaphragms. Normal spleen. 4. Normal bone marrow.    03/05/2018 Imaging    LV EF: 55% -  60%    03/06/2018 Cancer Staging    Staging form: Hodgkin and Non-Hodgkin Lymphoma, AJCC 8th Edition - Clinical stage from 03/06/2018: Stage II (Hodgkin lymphoma, B - Symptoms) - Signed by Heath Lark, MD on 03/06/2018    03/08/2018 -  Chemotherapy    The patient had ABVD for chemotherapy treatment.       REVIEW OF SYSTEMS:   Constitutional: Denies fevers, chills or abnormal weight loss Eyes: Denies blurriness of vision Respiratory: Denies cough, dyspnea or  wheezes Cardiovascular: Denies palpitation, chest discomfort or lower extremity swelling Skin: Denies abnormal skin rashes Lymphatics: Denies new lymphadenopathy or easy bruising Neurological:Denies numbness, tingling or new weaknesses All other systems were reviewed with the patient and are negative.  I have reviewed the past medical history, past surgical history, social history and family history with the patient and they are unchanged from previous note.  ALLERGIES:  has No Known Allergies.  MEDICATIONS:  Current Outpatient Medications  Medication Sig Dispense Refill  . dexamethasone (DECADRON) 4 MG tablet Take 1 tablet (4 mg total) by mouth 2 (two) times daily with a meal. 60 tablet 0  . ibuprofen (ADVIL,MOTRIN) 800 MG tablet Take 1 tablet (800 mg total) by mouth every 8 (eight) hours as needed. (Patient not taking: Reported on 03/26/2018) 30 tablet 0  . lidocaine-prilocaine (EMLA) cream Apply 1 application topically as needed. 30 g 6  . LORazepam (ATIVAN) 1 MG tablet Take 1 tablet (1 mg total) by mouth every 8 (eight) hours as needed for sleep (nausea). 60 tablet 0  . magic mouthwash w/lidocaine SOLN Take 5 mLs by mouth 4 (four) times daily as needed for mouth pain. 160 mL 1  . ondansetron (ZOFRAN) 8 MG tablet Take 1 tablet (8 mg total) by mouth every 8 (eight) hours as needed. Start on the third day after chemotherapy. 30 tablet 1  . prochlorperazine (COMPAZINE) 10 MG tablet Take 1 tablet (10 mg total) by mouth every 6 (six) hours as needed (Nausea or vomiting). [place]  tablet 1  . traMADol (ULTRAM) 50 MG tablet Take 1 tablet (50 mg total) by mouth every 6 (six) hours as needed. 30 tablet 0   No current facility-administered medications for this visit.     PHYSICAL EXAMINATION: ECOG PERFORMANCE STATUS: 1 - Symptomatic but completely ambulatory  Vitals:   04/05/18 1026  BP: 105/62  Pulse: 85  Resp: 18  Temp: 97.8 F (36.6 C)  SpO2: 100%   Filed Weights   04/05/18 1026  Weight:  183 lb 9.6 oz (83.3 kg)    GENERAL:alert, no distress and comfortable SKIN: skin color, texture, turgor are normal, no rashes or significant lesions EYES: normal, Conjunctiva are pink and non-injected, sclera clear OROPHARYNX:no exudate, no erythema and lips, buccal mucosa, and tongue normal  NECK: supple, thyroid normal size, non-tender, without nodularity LYMPH:  no palpable lymphadenopathy in the cervical, axillary or inguinal LUNGS: clear to auscultation and percussion with normal breathing effort HEART: regular rate & rhythm and no murmurs and no lower extremity edema ABDOMEN:abdomen soft, non-tender and normal bowel sounds Musculoskeletal:no cyanosis of digits and no clubbing  NEURO: alert & oriented x 3 with fluent speech, no focal motor/sensory deficits  LABORATORY DATA:  I have reviewed the data as listed    Component Value Date/Time   NA 143 04/05/2018 0957   K 4.0 04/05/2018 0957   CL 107 04/05/2018 0957   CO2 27 04/05/2018 0957   GLUCOSE 96 04/05/2018 0957   BUN 14 04/05/2018 0957   CREATININE 0.76 04/05/2018 0957   CREATININE 0.76 03/26/2018 1147   CALCIUM 9.7 04/05/2018 0957   PROT 7.4 04/05/2018 0957   ALBUMIN 4.1 04/05/2018 0957   AST 21 04/05/2018 0957   AST 14 (L) 03/26/2018 1147   ALT 24 04/05/2018 0957   ALT 24 03/26/2018 1147   ALKPHOS 73 04/05/2018 0957   BILITOT 0.4 04/05/2018 0957   BILITOT 1.0 03/26/2018 1147   GFRNONAA >60 04/05/2018 0957   GFRNONAA >60 03/26/2018 1147   GFRAA >60 04/05/2018 0957   GFRAA >60 03/26/2018 1147    No results found for: SPEP, UPEP  Lab Results  Component Value Date   WBC 2.7 (L) 04/05/2018   NEUTROABS 0.4 (LL) 04/05/2018   HGB 11.9 04/05/2018   HCT 35.9 04/05/2018   MCV 85.1 04/05/2018   PLT 329 04/05/2018      Chemistry      Component Value Date/Time   NA 143 04/05/2018 0957   K 4.0 04/05/2018 0957   CL 107 04/05/2018 0957   CO2 27 04/05/2018 0957   BUN 14 04/05/2018 0957   CREATININE 0.76  04/05/2018 0957   CREATININE 0.76 03/26/2018 1147      Component Value Date/Time   CALCIUM 9.7 04/05/2018 0957   ALKPHOS 73 04/05/2018 0957   AST 21 04/05/2018 0957   AST 14 (L) 03/26/2018 1147   ALT 24 04/05/2018 0957   ALT 24 03/26/2018 1147   BILITOT 0.4 04/05/2018 0957   BILITOT 1.0 03/26/2018 1147       RADIOGRAPHIC STUDIES: I have personally reviewed the radiological images as listed and agreed with the findings in the report. Dg Chest Port 1 View  Result Date: 03/07/2018 CLINICAL DATA:  Status post Port-A-Cath insertion. EXAM: PORTABLE CHEST 1 VIEW COMPARISON:  None. FINDINGS: The cardiomediastinal silhouette is unremarkable. A LEFT IJ Port-A-Cath is noted with tip overlying the SUPERIOR cavoatrial junction. There is no evidence of focal airspace disease, pulmonary edema, suspicious pulmonary nodule/mass, pleural effusion, or pneumothorax. No  acute bony abnormalities are identified. IMPRESSION: LEFT IJ Port-A-Cath with tip overlying the SUPERIOR cavoatrial junction. No evidence of pneumothorax. Electronically Signed   By: Margarette Canada M.D.   On: 03/07/2018 11:07   Dg Fluoro Guide Cv Line-no Report  Result Date: 03/07/2018 Fluoroscopy was utilized by the requesting physician.  No radiographic interpretation.    All questions were answered. The patient knows to call the clinic with any problems, questions or concerns. No barriers to learning was detected.  I spent 40 minutes counseling the patient face to face. The total time spent in the appointment was 55 minutes and more than 50% was on counseling and review of test results  Heath Lark, MD 04/06/2018 7:51 AM

## 2018-04-06 NOTE — Assessment & Plan Note (Signed)
This is an expected side effects of treatment She is not symptomatic I shared with her data in regards to not using G-CSF support We will proceed regardless of ANC

## 2018-04-17 ENCOUNTER — Telehealth: Payer: Self-pay | Admitting: *Deleted

## 2018-04-17 NOTE — Telephone Encounter (Signed)
As noted below by Dr. Alvy Bimler, I instructed patient to get Nasacort nasal spray. Directions are one spray in each nare twice a day. Patient already has Claritin at home. She verbalized understanding.

## 2018-04-17 NOTE — Telephone Encounter (Signed)
Received voice mail message from patient stating,"could Dr. Alvy Bimler recommend something for my congestion and snotty nose. I don't have a fever. I think it's my allergies. Return number is [phone]."

## 2018-04-17 NOTE — Telephone Encounter (Signed)
nasacort 1 spray BID plus zyrtec/claritin daily

## 2018-04-18 ENCOUNTER — Ambulatory Visit (HOSPITAL_COMMUNITY)
Admission: RE | Admit: 2018-04-18 | Discharge: 2018-04-18 | Disposition: A | Discharge status: Home / Self Care | Payer: 59 | Source: Ambulatory Visit | Attending: Hematology and Oncology | Admitting: Hematology and Oncology

## 2018-04-18 ENCOUNTER — Encounter: Payer: Self-pay | Admitting: Hematology and Oncology

## 2018-04-18 ENCOUNTER — Telehealth: Payer: Self-pay | Admitting: Hematology and Oncology

## 2018-04-18 ENCOUNTER — Inpatient Hospital Stay (HOSPITAL_BASED_OUTPATIENT_CLINIC_OR_DEPARTMENT_OTHER): Payer: 59 | Admitting: Hematology and Oncology

## 2018-04-18 ENCOUNTER — Inpatient Hospital Stay: Payer: 59 | Attending: Hematology and Oncology

## 2018-04-18 ENCOUNTER — Other Ambulatory Visit: Payer: Self-pay | Admitting: Hematology and Oncology

## 2018-04-18 DIAGNOSIS — D709 Neutropenia, unspecified: Secondary | ICD-10-CM | POA: Diagnosis not present

## 2018-04-18 DIAGNOSIS — R05 Cough: Secondary | ICD-10-CM | POA: Insufficient documentation

## 2018-04-18 DIAGNOSIS — C8170 Other classical Hodgkin lymphoma, unspecified site: Secondary | ICD-10-CM | POA: Diagnosis present

## 2018-04-18 DIAGNOSIS — R5081 Fever presenting with conditions classified elsewhere: Secondary | ICD-10-CM | POA: Diagnosis not present

## 2018-04-18 DIAGNOSIS — Z79899 Other long term (current) drug therapy: Secondary | ICD-10-CM | POA: Insufficient documentation

## 2018-04-18 DIAGNOSIS — Z23 Encounter for immunization: Secondary | ICD-10-CM | POA: Diagnosis not present

## 2018-04-18 DIAGNOSIS — Z5111 Encounter for antineoplastic chemotherapy: Secondary | ICD-10-CM | POA: Insufficient documentation

## 2018-04-18 DIAGNOSIS — C8171 Other classical Hodgkin lymphoma, lymph nodes of head, face, and neck: Secondary | ICD-10-CM | POA: Insufficient documentation

## 2018-04-18 DIAGNOSIS — J019 Acute sinusitis, unspecified: Secondary | ICD-10-CM | POA: Insufficient documentation

## 2018-04-18 DIAGNOSIS — G620 Drug-induced polyneuropathy: Secondary | ICD-10-CM | POA: Insufficient documentation

## 2018-04-18 DIAGNOSIS — J018 Other acute sinusitis: Secondary | ICD-10-CM | POA: Diagnosis not present

## 2018-04-18 HISTORY — DX: Neutropenia, unspecified: D70.9

## 2018-04-18 HISTORY — DX: Fever presenting with conditions classified elsewhere: R50.81

## 2018-04-18 LAB — CBC WITH DIFFERENTIAL/PLATELET
BASOS PCT: 1 %
Basophils Absolute: 0.1 10*3/uL (ref 0.0–0.1)
EOS PCT: 5 %
Eosinophils Absolute: 0.4 10*3/uL (ref 0.0–0.5)
HEMATOCRIT: 37.3 % (ref 34.8–46.6)
Hemoglobin: 12.3 g/dL (ref 11.6–15.9)
Lymphocytes Relative: 26 %
Lymphs Abs: 1.8 10*3/uL (ref 0.9–3.3)
MCH: 28.6 pg (ref 25.1–34.0)
MCHC: 33 g/dL (ref 31.5–36.0)
MCV: 86.7 fL (ref 79.5–101.0)
MONO ABS: 0.8 10*3/uL (ref 0.1–0.9)
MONOS PCT: 11 %
NEUTROS ABS: 3.9 10*3/uL (ref 1.5–6.5)
Neutrophils Relative %: 57 %
PLATELETS: 257 10*3/uL (ref 145–400)
RBC: 4.3 MIL/uL (ref 3.70–5.45)
RDW: 15.1 % — AB (ref 11.2–14.5)
WBC: 6.9 10*3/uL (ref 3.9–10.3)

## 2018-04-18 LAB — COMPREHENSIVE METABOLIC PANEL
ALBUMIN: 4.1 g/dL (ref 3.5–5.0)
ALT: 20 U/L (ref 0–44)
ANION GAP: 10 (ref 5–15)
AST: 17 U/L (ref 15–41)
Alkaline Phosphatase: 74 U/L (ref 38–126)
BILIRUBIN TOTAL: 0.4 mg/dL (ref 0.3–1.2)
BUN: 13 mg/dL (ref 6–20)
CHLORIDE: 107 mmol/L (ref 98–111)
CO2: 23 mmol/L (ref 22–32)
Calcium: 10 mg/dL (ref 8.9–10.3)
Creatinine, Ser: 0.76 mg/dL (ref 0.44–1.00)
GFR calc Af Amer: >60 mL/min (ref >60)
GFR calc non Af Amer: >60 mL/min (ref >60)
GLUCOSE: 91 mg/dL (ref 70–99)
POTASSIUM: 4 mmol/L (ref 3.5–5.1)
Sodium: 140 mmol/L (ref 135–145)
TOTAL PROTEIN: 7.5 g/dL (ref 6.5–8.1)

## 2018-04-18 LAB — URINALYSIS, COMPLETE (UACMP) WITH MICROSCOPIC
BILIRUBIN URINE: NEGATIVE
GLUCOSE, UA: NEGATIVE mg/dL
HGB URINE DIPSTICK: NEGATIVE
KETONES UR: NEGATIVE mg/dL
NITRITE: NEGATIVE
PH: 6 (ref 5.0–8.0)
PROTEIN: NEGATIVE mg/dL
Specific Gravity, Urine: 1.013 (ref 1.005–1.030)

## 2018-04-18 LAB — PREGNANCY, URINE: Preg Test, Ur: NEGATIVE

## 2018-04-18 MED ORDER — AZITHROMYCIN 500 MG PO TABS: 500.0000 mg | ORAL_TABLET | Freq: Every day | ORAL | 0 refills | Status: DC

## 2018-04-18 MED ORDER — TRIAMCINOLONE ACETONIDE 55 MCG/ACT NA AERO: 2.0000 | INHALATION_SPRAY | Freq: Every day | NASAL | 12 refills | Status: DC

## 2018-04-18 NOTE — Assessment & Plan Note (Addendum)
She was found to be neutropenic recently. She had try conservative management with Mucinex and nasal inhaler at home but her symptoms are not improving With her signs and symptoms of sinusitis, I am concerned about risk of infection/sepsis I have order blood culture, chest x-ray and urine culture for further evaluation I recommend antibiotic therapy along with nasacort to treat acute sinus infection. She is educated to watch out for signs and symptoms of progression of symptoms  We will delay her chemotherapy as above.

## 2018-04-18 NOTE — Progress Notes (Signed)
[place] OFFICE PROGRESS NOTE  Patient Care Team: [name], MD as PCP - General (Family Medicine)  ASSESSMENT & PLAN:  Classical Hodgkin lymphoma [facility]) She had received chemotherapy recently.  Continue supportive care. Due to infection, I recommend holding off treatment this week and rescheduled to next week.  Neutropenic fever ([place]) She was found to be neutropenic recently. She had try conservative management with Mucinex and nasal inhaler at home but her symptoms are not improving With her signs and symptoms of sinusitis, I am concerned about risk of infection/sepsis I have order blood culture, chest x-ray and urine culture for further evaluation I recommend antibiotic therapy along with nasacort to treat acute sinus infection. She is educated to watch out for signs and symptoms of progression of symptoms  We will delay her chemotherapy as above.   No orders of the defined types were placed in this encounter.   INTERVAL HISTORY: Please see below for problem oriented charting. She is seen urgently for further follow-up She tolerated last cycle of treatment well Starting yesterday, she started to have some chills and significant sinus drainage, congestion and cough. She denies high-grade fever.  No recent nausea vomiting.  SUMMARY OF ONCOLOGIC HISTORY:   Classical Hodgkin lymphoma ([place])   01/22/2018 Imaging    CT scan of neck: Adenopathy throughout the right lateral neck, right supraclavicular fossa, and upper mediastinum. There is no specific imaging feature, lymphoma or atypical infection are the primary considerations and excisional biopsy should be considered if there is no diagnosis by history or labs.     01/31/2018 Pathology Results    Lymph node for lymphoma, right supraclavicular / right cervical - ATYPICAL LYMPHOID PROLIFERATION. - SEE COMMENT. Microscopic Comment Sections show a few very small needle core biopsy fragments of lymph nodal  tissue displaying a polymorphous cellular proliferation of small lymphocytes, plasma cells, eosinophils and histiocytes in addition to variable numbers of large mononuclear and multilobated lymphoid appearing cells with variably prominent nucleoli. Flow cytometric was performed ([id number]) and failed to show any monoclonal B-cell population or abnormal T-cell phenotype. Immunohistochemical stains were performed, including CD15, CD20, CD3, CD30, LCA, and PAX-5 with appropriate controls. The large atypical lymphoid appearing cells are positive for CD15, CD30, CD20 and PAX-5 and negative for CD3 and LCA. The small lymphoid component in the background is mostly composed of T cells. The morphologic and phenotypic features are atypical and highly suspicious for involvement by a lymphoproliferative process, particularly classical Hodgkin lymphoma. Excisional biopsy is recommended.    01/31/2018 Procedure    Ultrasound-guided core biopsies of right cervical lymph nodes.    02/15/2018 Pathology Results    Lymph node for lymphoma, Right Cervical - CLASSICAL HODGKIN LYMPHOMA, NODULAR SCLEROSIS TYPE    03/05/2018 PET scan    1. Hypermetabolic RIGHT level 2 lymph nodes, supraclavicular lymph nodes, sub pectoralis lymph nodes and RIGHT axillary lymph nodes. 2. Hypermetabolic mediastinal lymph nodes. 3. No evidence of lymphoma beneath the diaphragms. Normal spleen. 4. Normal bone marrow.    03/05/2018 Imaging    LV EF: 55% -  60%    03/06/2018 Cancer Staging    Staging form: Hodgkin and Non-Hodgkin Lymphoma, AJCC 8th Edition - Clinical stage from 03/06/2018: Stage II (Hodgkin lymphoma, B - Symptoms) - Signed by Heath Lark, MD on 03/06/2018    03/08/2018 -  Chemotherapy    The patient had ABVD for chemotherapy treatment.       REVIEW OF SYSTEMS:   Eyes: Denies blurriness of vision  Ears, nose, mouth, throat, and face: Denies mucositis or sore throat Respiratory: Denies cough, dyspnea or  wheezes Cardiovascular: Denies palpitation, chest discomfort or lower extremity swelling Gastrointestinal:  Denies nausea, heartburn or change in bowel habits Skin: Denies abnormal skin rashes Lymphatics: Denies new lymphadenopathy or easy bruising Neurological:Denies numbness, tingling or new weaknesses Behavioral/Psych: Mood is stable, no new changes  All other systems were reviewed with the patient and are negative.  I have reviewed the past medical history, past surgical history, social history and family history with the patient and they are unchanged from previous note.  ALLERGIES:  has No Known Allergies.  MEDICATIONS:  Current Outpatient Medications  Medication Sig Dispense Refill  . azithromycin (ZITHROMAX) 500 MG tablet Take 1 tablet (500 mg total) by mouth daily. 3 tablet 0  . dexamethasone (DECADRON) 4 MG tablet Take 1 tablet (4 mg total) by mouth 2 (two) times daily with a meal. 60 tablet 0  . ibuprofen (ADVIL,MOTRIN) 800 MG tablet Take 1 tablet (800 mg total) by mouth every 8 (eight) hours as needed. (Patient not taking: Reported on 03/26/2018) 30 tablet 0  . lidocaine-prilocaine (EMLA) cream Apply 1 application topically as needed. 30 g 6  . LORazepam (ATIVAN) 1 MG tablet Take 1 tablet (1 mg total) by mouth every 8 (eight) hours as needed for sleep (nausea). 60 tablet 0  . magic mouthwash w/lidocaine SOLN Take 5 mLs by mouth 4 (four) times daily as needed for mouth pain. 160 mL 1  . ondansetron (ZOFRAN) 8 MG tablet Take 1 tablet (8 mg total) by mouth every 8 (eight) hours as needed. Start on the third day after chemotherapy. 30 tablet 1  . prochlorperazine (COMPAZINE) 10 MG tablet Take 1 tablet (10 mg total) by mouth every 6 (six) hours as needed (Nausea or vomiting). 30 tablet 1  . traMADol (ULTRAM) 50 MG tablet Take 1 tablet (50 mg total) by mouth every 6 (six) hours as needed. 30 tablet 0  . triamcinolone (NASACORT) 55 MCG/ACT AERO nasal inhaler Place 2 sprays into the nose  daily. 1 Inhaler 12   No current facility-administered medications for this visit.     PHYSICAL EXAMINATION: ECOG PERFORMANCE STATUS: 1 - Symptomatic but completely ambulatory  Vitals:   04/18/18 1215  BP: 104/68  Pulse: 71  Resp: 20  Temp: 98.4 F (36.9 C)  SpO2: 100%   Filed Weights   04/18/18 1215  Weight: 187 lb 6.4 oz (85 kg)    GENERAL:alert, no distress and comfortable SKIN: skin color, texture, turgor are normal, no rashes or significant lesions EYES: normal, Conjunctiva are pink and non-injected, sclera clear OROPHARYNX:no exudate, no erythema and lips, buccal mucosa, and tongue normal  NECK: supple, thyroid normal size, non-tender, without nodularity LYMPH:  no palpable lymphadenopathy in the cervical, axillary or inguinal LUNGS: clear to auscultation and percussion with normal breathing effort HEART: regular rate & rhythm and no murmurs and no lower extremity edema ABDOMEN:abdomen soft, non-tender and normal bowel sounds Musculoskeletal:no cyanosis of digits and no clubbing  NEURO: alert & oriented x 3 with fluent speech, no focal motor/sensory deficits  LABORATORY DATA:  I have reviewed the data as listed    Component Value Date/Time   NA 140 04/18/2018 1118   K 4.0 04/18/2018 1118   CL 107 04/18/2018 1118   CO2 23 04/18/2018 1118   GLUCOSE 91 04/18/2018 1118   BUN 13 04/18/2018 1118   CREATININE 0.76 04/18/2018 1118   CREATININE 0.76 03/26/2018 1147  CALCIUM 10.0 04/18/2018 1118   PROT 7.5 04/18/2018 1118   ALBUMIN 4.1 04/18/2018 1118   AST 17 04/18/2018 1118   AST 14 (L) 03/26/2018 1147   ALT 20 04/18/2018 1118   ALT 24 03/26/2018 1147   ALKPHOS 74 04/18/2018 1118   BILITOT 0.4 04/18/2018 1118   BILITOT 1.0 03/26/2018 1147   GFRNONAA >60 04/18/2018 1118   GFRNONAA >60 03/26/2018 1147   GFRAA >60 04/18/2018 1118   GFRAA >60 03/26/2018 1147    No results found for: SPEP, UPEP  Lab Results  Component Value Date   WBC 6.9 04/18/2018    NEUTROABS 3.9 04/18/2018   HGB 12.3 04/18/2018   HCT 37.3 04/18/2018   MCV 86.7 04/18/2018   PLT 257 04/18/2018      Chemistry      Component Value Date/Time   NA 140 04/18/2018 1118   K 4.0 04/18/2018 1118   CL 107 04/18/2018 1118   CO2 23 04/18/2018 1118   BUN 13 04/18/2018 1118   CREATININE 0.76 04/18/2018 1118   CREATININE 0.76 03/26/2018 1147      Component Value Date/Time   CALCIUM 10.0 04/18/2018 1118   ALKPHOS 74 04/18/2018 1118   AST 17 04/18/2018 1118   AST 14 (L) 03/26/2018 1147   ALT 20 04/18/2018 1118   ALT 24 03/26/2018 1147   BILITOT 0.4 04/18/2018 1118   BILITOT 1.0 03/26/2018 1147       RADIOGRAPHIC STUDIES: I have personally reviewed the radiological images as listed and agreed with the findings in the report. Dg Chest 2 View  Result Date: 04/18/2018 CLINICAL DATA:  Cough and congestion for several days. The patient is undergoing chemotherapy for lymphoma. EXAM: CHEST - 2 VIEW COMPARISON:  Single-view of the chest 03/07/2018. PET CT scan 03/05/2018. FINDINGS: The lungs are clear. Heart size is normal. No pneumothorax or pleural effusion. Port-A-Cath is in place with its tip in the lower superior vena cava. No bony abnormality is identified. IMPRESSION: Negative chest. Electronically Signed   By: Inge Rise M.D.   On: 04/18/2018 11:55    All questions were answered. The patient knows to call the clinic with any problems, questions or concerns. No barriers to learning was detected.  I spent 15 minutes counseling the patient face to face. The total time spent in the appointment was 20 minutes and more than 50% was on counseling and review of test results  Heath Lark, MD 04/18/2018 1:34 PM

## 2018-04-18 NOTE — Telephone Encounter (Signed)
Received voice mail message from patient stating,"I called yesterday because I thought I had allergies, but this morning my snot is green and I have a cough. My return number is [phone]."

## 2018-04-18 NOTE — Telephone Encounter (Signed)
Added lab/NG appt for today per NG, per Erline Levine, R.N.

## 2018-04-18 NOTE — Assessment & Plan Note (Addendum)
She had received chemotherapy recently.  Continue supportive care. Due to infection, I recommend holding off treatment this week and rescheduled to next week.

## 2018-04-18 NOTE — Telephone Encounter (Signed)
Gave patient avs and calendar.   °

## 2018-04-18 NOTE — Telephone Encounter (Signed)
I need to see her Get her to [facility] for stat CXR, then labs here and then see me I will put orders

## 2018-04-19 LAB — URINE CULTURE

## 2018-04-20 ENCOUNTER — Inpatient Hospital Stay: Payer: 59

## 2018-04-20 ENCOUNTER — Other Ambulatory Visit: Payer: Self-pay | Admitting: *Deleted

## 2018-04-20 ENCOUNTER — Inpatient Hospital Stay: Payer: 59 | Admitting: Hematology and Oncology

## 2018-04-20 ENCOUNTER — Telehealth: Payer: Self-pay | Admitting: *Deleted

## 2018-04-20 DIAGNOSIS — C8170 Other classical Hodgkin lymphoma, unspecified site: Secondary | ICD-10-CM

## 2018-04-20 NOTE — Telephone Encounter (Signed)
-----   Message from Heath Lark, MD sent at 04/20/2018  7:36 AM EDT ----- Regarding: how is she pls let her know blood Cx is negative Is she better? ----- Message ----- From: Interface, Lab In [place] Sent: 04/18/2018  12:11 PM EDT To: Heath Lark, MD

## 2018-04-20 NOTE — Telephone Encounter (Signed)
As noted below by Dr. Alvy Bimler, I informed the patient that her blood culture was negative. Patient stated,"I am feeling much better, thank you for calling."

## 2018-04-23 ENCOUNTER — Inpatient Hospital Stay: Payer: 59

## 2018-04-23 VITALS — BP 106/60 | HR 63 | Temp 97.7°F | Resp 18 | Ht 69.0 in | Wt 190.8 lb

## 2018-04-23 DIAGNOSIS — Z5111 Encounter for antineoplastic chemotherapy: Secondary | ICD-10-CM | POA: Diagnosis not present

## 2018-04-23 DIAGNOSIS — C8170 Other classical Hodgkin lymphoma, unspecified site: Secondary | ICD-10-CM

## 2018-04-23 LAB — COMPREHENSIVE METABOLIC PANEL
ALT: 21 U/L (ref 0–44)
AST: 18 U/L (ref 15–41)
Albumin: 4 g/dL (ref 3.5–5.0)
Alkaline Phosphatase: 72 U/L (ref 38–126)
Anion gap: 10 (ref 5–15)
BUN: 13 mg/dL (ref 6–20)
CHLORIDE: 108 mmol/L (ref 98–111)
CO2: 25 mmol/L (ref 22–32)
CREATININE: 0.83 mg/dL (ref 0.44–1.00)
Calcium: 9.9 mg/dL (ref 8.9–10.3)
GFR calc non Af Amer: >60 mL/min (ref >60)
Glucose, Bld: 89 mg/dL (ref 70–99)
Potassium: 3.8 mmol/L (ref 3.5–5.1)
Sodium: 143 mmol/L (ref 135–145)
Total Bilirubin: 0.5 mg/dL (ref 0.3–1.2)
Total Protein: 7.3 g/dL (ref 6.5–8.1)

## 2018-04-23 LAB — CBC WITH DIFFERENTIAL/PLATELET
BASOS ABS: 0.1 10*3/uL (ref 0.0–0.1)
Basophils Relative: 2 %
EOS ABS: 0.4 10*3/uL (ref 0.0–0.5)
EOS PCT: 8 %
HCT: 36.2 % (ref 34.8–46.6)
Hemoglobin: 12 g/dL (ref 11.6–15.9)
Lymphocytes Relative: 43 %
Lymphs Abs: 2.1 10*3/uL (ref 0.9–3.3)
MCH: 28.3 pg (ref 25.1–34.0)
MCHC: 33.1 g/dL (ref 31.5–36.0)
MCV: 85.6 fL (ref 79.5–101.0)
Monocytes Absolute: 0.6 10*3/uL (ref 0.1–0.9)
Monocytes Relative: 13 %
Neutro Abs: 1.6 10*3/uL (ref 1.5–6.5)
Neutrophils Relative %: 34 %
Platelets: 254 10*3/uL (ref 145–400)
RBC: 4.23 MIL/uL (ref 3.70–5.45)
RDW: 15.7 % — ABNORMAL HIGH (ref 11.2–14.5)
WBC: 4.8 10*3/uL (ref 3.9–10.3)

## 2018-04-23 LAB — CULTURE, BLOOD (SINGLE): Culture: NO GROWTH

## 2018-04-23 LAB — PREGNANCY, URINE: Preg Test, Ur: NEGATIVE

## 2018-04-23 MED ORDER — VINBLASTINE SULFATE CHEMO INJECTION 1 MG/ML
3.0000 mg/m2 | Freq: Once | INTRAVENOUS | Status: AC
  Administered 2018-04-23: 6 mg via INTRAVENOUS
  Filled 2018-04-23: qty 6

## 2018-04-23 MED ORDER — PALONOSETRON HCL INJECTION 0.25 MG/5ML
0.2500 mg | Freq: Once | INTRAVENOUS | Status: AC
  Administered 2018-04-23: 0.25 mg via INTRAVENOUS

## 2018-04-23 MED ORDER — LORAZEPAM 2 MG/ML IJ SOLN
0.5000 mg | Freq: Once | INTRAMUSCULAR | Status: AC
  Administered 2018-04-23: 0.5 mg via INTRAVENOUS

## 2018-04-23 MED ORDER — SODIUM CHLORIDE 0.9% FLUSH
10.0000 mL | INTRAVENOUS | Status: DC | PRN
  Administered 2018-04-23: 10 mL
  Filled 2018-04-23: qty 10

## 2018-04-23 MED ORDER — HEPARIN SOD (PORK) LOCK FLUSH 100 UNIT/ML IV SOLN
500.0000 [IU] | Freq: Once | INTRAVENOUS | Status: AC | PRN
  Administered 2018-04-23: 500 [IU]
  Filled 2018-04-23: qty 5

## 2018-04-23 MED ORDER — SODIUM CHLORIDE 0.9 % IV SOLN
Freq: Once | INTRAVENOUS | Status: AC
  Administered 2018-04-23: 13:00:00 via INTRAVENOUS
  Filled 2018-04-23: qty 250

## 2018-04-23 MED ORDER — LORAZEPAM 2 MG/ML IJ SOLN
INTRAMUSCULAR | Status: AC
  Filled 2018-04-23: qty 1

## 2018-04-23 MED ORDER — SODIUM CHLORIDE 0.9 % IV SOLN
375.0000 mg/m2 | Freq: Once | INTRAVENOUS | Status: AC
  Administered 2018-04-23: 750 mg via INTRAVENOUS
  Filled 2018-04-23: qty 75

## 2018-04-23 MED ORDER — SODIUM CHLORIDE 0.9 % IJ SOLN
10.0000 mL | Freq: Once | INTRAMUSCULAR | Status: AC
  Administered 2018-04-23: 10 mL
  Filled 2018-04-23: qty 10

## 2018-04-23 MED ORDER — PALONOSETRON HCL INJECTION 0.25 MG/5ML
INTRAVENOUS | Status: AC
  Filled 2018-04-23: qty 5

## 2018-04-23 MED ORDER — DOXORUBICIN HCL CHEMO IV INJECTION 2 MG/ML
25.0000 mg/m2 | Freq: Once | INTRAVENOUS | Status: AC
  Administered 2018-04-23: 50 mg via INTRAVENOUS
  Filled 2018-04-23: qty 25

## 2018-04-23 MED ORDER — SODIUM CHLORIDE 0.9 % IV SOLN
10.0000 [IU]/m2 | Freq: Once | INTRAVENOUS | Status: AC
  Administered 2018-04-23: 20 [IU] via INTRAVENOUS
  Filled 2018-04-23: qty 6.67

## 2018-04-23 MED ORDER — SODIUM CHLORIDE 0.9 % IV SOLN
Freq: Once | INTRAVENOUS | Status: AC
  Administered 2018-04-23: 14:00:00 via INTRAVENOUS
  Filled 2018-04-23: qty 5

## 2018-04-23 NOTE — Progress Notes (Signed)
Patient had received her Adriamycin and was just completing her Vinblastine when she started complaining of nausea. Got an order for Ativan 0.5 mg , administered this and waited for about 20 minutes of which patient verbalized relief stating she felt so much better. Treatment regimen resumed with Bleomycin. Will continue to monitor.

## 2018-04-23 NOTE — Patient Instructions (Signed)
[facility] Discharge Instructions for Patients Receiving Chemotherapy  Today you received the following chemotherapy agents: Doxorubicin (Adriamycin), Vinblastine (Velban), Bleomycin (Bleocin), and Dacarbazine (DTIC)  To help prevent nausea and vomiting after your treatment, we encourage you to take your nausea medication as directed. Received Aloxi during treatment today-->Take Compazine (not Zofran) for the next 3 days as needed.    If you develop nausea and vomiting that is not controlled by your nausea medication, call the clinic.   BELOW ARE SYMPTOMS THAT SHOULD BE REPORTED IMMEDIATELY:  *FEVER GREATER THAN 100.5 F  *CHILLS WITH OR WITHOUT FEVER  NAUSEA AND VOMITING THAT IS NOT CONTROLLED WITH YOUR NAUSEA MEDICATION  *UNUSUAL SHORTNESS OF BREATH  *UNUSUAL BRUISING OR BLEEDING  TENDERNESS IN MOUTH AND THROAT WITH OR WITHOUT PRESENCE OF ULCERS  *URINARY PROBLEMS  *BOWEL PROBLEMS  UNUSUAL RASH Items with * indicate a potential emergency and should be followed up as soon as possible.  Feel free to call the clinic should you have any questions or concerns. The clinic phone number is [phone].  Please show the [facility] at check-in to the Emergency Department and triage nurse.

## 2018-04-30 ENCOUNTER — Telehealth: Payer: Self-pay | Admitting: *Deleted

## 2018-04-30 NOTE — Telephone Encounter (Signed)
FYI "Adrienne [name] calling to report 25 lb weight gain since starting treatment August 1st.  I'm concerned if something else is going on.  Exercise two days a week.  Not overeat because foods taste terrible.  No vomiting or diarrhea only occasional nausea.  Yes, a little swelling to hands and feet but not much and neither side is larger than the other."  Denies further symptoms.  "Drinking only water and coffee at times."   Declined offer to transfer to collaborative at this time.  Discussed dexamethasone, food management, weight association with treatment outcomes therefore do not focus on weight loss at this time.

## 2018-05-01 ENCOUNTER — Ambulatory Visit (HOSPITAL_COMMUNITY)
Admission: RE | Admit: 2018-05-01 | Discharge: 2018-05-01 | Disposition: A | Discharge status: Home / Self Care | Payer: 59 | Source: Ambulatory Visit | Attending: Hematology and Oncology | Admitting: Hematology and Oncology

## 2018-05-01 ENCOUNTER — Encounter (HOSPITAL_COMMUNITY): Payer: Self-pay | Admitting: Radiology

## 2018-05-01 DIAGNOSIS — C8170 Other classical Hodgkin lymphoma, unspecified site: Secondary | ICD-10-CM | POA: Diagnosis present

## 2018-05-01 LAB — GLUCOSE, CAPILLARY: Glucose-Capillary: 85 mg/dL (ref 70–99)

## 2018-05-01 MED ORDER — FLUDEOXYGLUCOSE F - 18 (FDG) INJECTION
9.4900 | Freq: Once | INTRAVENOUS | Status: AC | PRN
  Administered 2018-05-01: 9.49 via INTRAVENOUS

## 2018-05-02 ENCOUNTER — Inpatient Hospital Stay (HOSPITAL_BASED_OUTPATIENT_CLINIC_OR_DEPARTMENT_OTHER): Payer: 59 | Admitting: Hematology and Oncology

## 2018-05-02 ENCOUNTER — Telehealth: Payer: Self-pay | Admitting: Hematology and Oncology

## 2018-05-02 ENCOUNTER — Encounter: Payer: Self-pay | Admitting: Hematology and Oncology

## 2018-05-02 VITALS — BP 110/72 | HR 63 | Temp 97.7°F | Resp 18 | Ht 69.0 in | Wt 190.0 lb

## 2018-05-02 DIAGNOSIS — T451X5A Adverse effect of antineoplastic and immunosuppressive drugs, initial encounter: Secondary | ICD-10-CM

## 2018-05-02 DIAGNOSIS — Z5111 Encounter for antineoplastic chemotherapy: Secondary | ICD-10-CM | POA: Diagnosis not present

## 2018-05-02 DIAGNOSIS — G620 Drug-induced polyneuropathy: Secondary | ICD-10-CM | POA: Diagnosis not present

## 2018-05-02 DIAGNOSIS — C8118 Nodular sclerosis classical Hodgkin lymphoma, lymph nodes of multiple sites: Secondary | ICD-10-CM

## 2018-05-02 DIAGNOSIS — K521 Toxic gastroenteritis and colitis: Secondary | ICD-10-CM

## 2018-05-02 DIAGNOSIS — R110 Nausea: Secondary | ICD-10-CM

## 2018-05-02 DIAGNOSIS — R635 Abnormal weight gain: Secondary | ICD-10-CM | POA: Diagnosis not present

## 2018-05-02 DIAGNOSIS — Z299 Encounter for prophylactic measures, unspecified: Secondary | ICD-10-CM

## 2018-05-02 DIAGNOSIS — C8170 Other classical Hodgkin lymphoma, unspecified site: Secondary | ICD-10-CM

## 2018-05-02 DIAGNOSIS — Z0000 Encounter for general adult medical examination without abnormal findings: Secondary | ICD-10-CM

## 2018-05-02 DIAGNOSIS — Z79899 Other long term (current) drug therapy: Secondary | ICD-10-CM

## 2018-05-02 DIAGNOSIS — Z7952 Long term (current) use of systemic steroids: Secondary | ICD-10-CM

## 2018-05-02 MED ORDER — INFLUENZA VAC SPLIT QUAD 0.5 ML IM SUSY
PREFILLED_SYRINGE | INTRAMUSCULAR | Status: AC
  Filled 2018-05-02: qty 0.5

## 2018-05-02 MED ORDER — INFLUENZA VAC SPLIT QUAD 0.5 ML IM SUSY
0.5000 mL | PREFILLED_SYRINGE | Freq: Once | INTRAMUSCULAR | Status: AC
  Administered 2018-05-02: 0.5 mL via INTRAMUSCULAR

## 2018-05-02 NOTE — Assessment & Plan Note (Signed)
We discussed the importance of preventive care and reviewed the vaccination programs. She does not have any prior allergic reactions to influenza vaccination. She agrees to proceed with influenza vaccination today and we will administer it today at the clinic.  

## 2018-05-02 NOTE — Assessment & Plan Note (Signed)
Her nausea is better controlled Recommend reduced dose dexamethasone due to excessive weight gain.

## 2018-05-02 NOTE — Assessment & Plan Note (Signed)
I plan to continue on reduced dose vinblastine.  Her neuropathy is stable

## 2018-05-02 NOTE — Telephone Encounter (Signed)
Gave patient avs report and appointments for October and November.  °

## 2018-05-02 NOTE — Progress Notes (Signed)
[name] OFFICE PROGRESS NOTE  Patient Care Team: [name], MD as PCP - General (Family Medicine)  ASSESSMENT & PLAN:  Classical Hodgkin lymphoma [facility]) I have reviewed the guidelines with the patient and family She has excellent response to therapy on PET CT scan The patient has made informed decision not to pursue radiation treatment I recommend 4 more cycles of chemotherapy but plan to discontinue bleomycin and keep Vinblastine at 50% of dose due to neuropathy She agree with the plan of care I will order repeat echocardiogram next month to follow   Peripheral neuropathy due to chemotherapy [facility]) I plan to continue on reduced dose vinblastine.  Her neuropathy is stable  Chemotherapy-induced nausea Her nausea is better controlled Recommend reduced dose dexamethasone due to excessive weight gain.  Preventive measure We discussed the importance of preventive care and reviewed the vaccination programs. She does not have any prior allergic reactions to influenza vaccination. She agrees to proceed with influenza vaccination today and we will administer it today at the clinic.    Orders Placed This Encounter  Procedures  . ECHOCARDIOGRAM COMPLETE    Standing Status:   Future    Standing Expiration Date:   08/02/2019    Order Specific Question:   Where should this test be performed    Answer:   [place]    Order Specific Question:   Perflutren DEFINITY (image enhancing agent) should be administered unless hypersensitivity or allergy exist    Answer:   Administer Perflutren    INTERVAL HISTORY: Please see below for problem oriented charting. She returns with family members for further follow-up She tolerated last cycle of chemotherapy better with less nausea She is not taking regular doses of dexamethasone Denies cough, chest pain or shortness of breath Peripheral neuropathy stable with reduced dose treatment.  SUMMARY OF ONCOLOGIC HISTORY:    Classical Hodgkin lymphoma ([place])   01/22/2018 Imaging    CT scan of neck: Adenopathy throughout the right lateral neck, right supraclavicular fossa, and upper mediastinum. There is no specific imaging feature, lymphoma or atypical infection are the primary considerations and excisional biopsy should be considered if there is no diagnosis by history or labs.     01/31/2018 Pathology Results    Lymph node for lymphoma, right supraclavicular / right cervical - ATYPICAL LYMPHOID PROLIFERATION. - SEE COMMENT. Microscopic Comment Sections show a few very small needle core biopsy fragments of lymph nodal tissue displaying a polymorphous cellular proliferation of small lymphocytes, plasma cells, eosinophils and histiocytes in addition to variable numbers of large mononuclear and multilobated lymphoid appearing cells with variably prominent nucleoli. Flow cytometric was performed ([id number]) and failed to show any monoclonal B-cell population or abnormal T-cell phenotype. Immunohistochemical stains were performed, including CD15, CD20, CD3, CD30, LCA, and PAX-5 with appropriate controls. The large atypical lymphoid appearing cells are positive for CD15, CD30, CD20 and PAX-5 and negative for CD3 and LCA. The small lymphoid component in the background is mostly composed of T cells. The morphologic and phenotypic features are atypical and highly suspicious for involvement by a lymphoproliferative process, particularly classical Hodgkin lymphoma. Excisional biopsy is recommended.    01/31/2018 Procedure    Ultrasound-guided core biopsies of right cervical lymph nodes.    02/15/2018 Pathology Results    Lymph node for lymphoma, Right Cervical - CLASSICAL HODGKIN LYMPHOMA, NODULAR SCLEROSIS TYPE    03/05/2018 PET scan    1. Hypermetabolic RIGHT level 2 lymph nodes, supraclavicular lymph nodes, sub pectoralis lymph nodes and  RIGHT axillary lymph nodes. 2. Hypermetabolic mediastinal lymph nodes. 3. No evidence of  lymphoma beneath the diaphragms. Normal spleen. 4. Normal bone marrow.    03/05/2018 Imaging    LV EF: 55% -  60%    03/06/2018 Cancer Staging    Staging form: Hodgkin and Non-Hodgkin Lymphoma, AJCC 8th Edition - Clinical stage from 03/06/2018: Stage II (Hodgkin lymphoma, B - Symptoms) - Signed by Heath Lark, MD on 03/06/2018    03/08/2018 -  Chemotherapy    The patient had ABVD for chemotherapy treatment.      05/01/2018 PET scan    1. Near complete resolution of metabolic activity within RIGHT cervical lymph nodes, mediastinal nodes, RIGHT supraclavicular nodes, and axillary nodes ( Deauville 1 and 2). 2. No evidence lymphoma progression. 3. Normal spleen and marrow. 4. Extensive hypermetabolic brown fat within the neck and thorax makes evaluation challenging.     REVIEW OF SYSTEMS:   Constitutional: Denies fevers, chills or abnormal weight loss Eyes: Denies blurriness of vision Ears, nose, mouth, throat, and face: Denies mucositis or sore throat Respiratory: Denies cough, dyspnea or wheezes Cardiovascular: Denies palpitation, chest discomfort or lower extremity swelling Skin: Denies abnormal skin rashes Lymphatics: Denies new lymphadenopathy or easy bruising Neurological:Denies numbness, tingling or new weaknesses Behavioral/Psych: Mood is stable, no new changes  All other systems were reviewed with the patient and are negative.  I have reviewed the past medical history, past surgical history, social history and family history with the patient and they are unchanged from previous note.  ALLERGIES:  has No Known Allergies.  MEDICATIONS:  Current Outpatient Medications  Medication Sig Dispense Refill  . dexamethasone (DECADRON) 4 MG tablet Take 1 tablet (4 mg total) by mouth 2 (two) times daily with a meal. 60 tablet 0  . ibuprofen (ADVIL,MOTRIN) 800 MG tablet Take 1 tablet (800 mg total) by mouth every 8 (eight) hours as needed. (Patient not taking: Reported on 03/26/2018) 30  tablet 0  . lidocaine-prilocaine (EMLA) cream Apply 1 application topically as needed. 30 g 6  . LORazepam (ATIVAN) 1 MG tablet Take 1 tablet (1 mg total) by mouth every 8 (eight) hours as needed for sleep (nausea). 60 tablet 0  . magic mouthwash w/lidocaine SOLN Take 5 mLs by mouth 4 (four) times daily as needed for mouth pain. 160 mL 1  . ondansetron (ZOFRAN) 8 MG tablet Take 1 tablet (8 mg total) by mouth every 8 (eight) hours as needed. Start on the third day after chemotherapy. 30 tablet 1  . prochlorperazine (COMPAZINE) 10 MG tablet Take 1 tablet (10 mg total) by mouth every 6 (six) hours as needed (Nausea or vomiting). 30 tablet 1  . traMADol (ULTRAM) 50 MG tablet Take 1 tablet (50 mg total) by mouth every 6 (six) hours as needed. 30 tablet 0  . triamcinolone (NASACORT) 55 MCG/ACT AERO nasal inhaler Place 2 sprays into the nose daily. 1 Inhaler 12   No current facility-administered medications for this visit.     PHYSICAL EXAMINATION: ECOG PERFORMANCE STATUS: 1 - Symptomatic but completely ambulatory  Vitals:   05/02/18 0901  BP: 110/72  Pulse: 63  Resp: 18  Temp: 97.7 F (36.5 C)  SpO2: 100%   Filed Weights   05/02/18 0901  Weight: 190 lb (86.2 kg)    GENERAL:alert, no distress and comfortable SKIN: skin color, texture, turgor are normal, no rashes or significant lesions EYES: normal, Conjunctiva are pink and non-injected, sclera clear OROPHARYNX:no exudate, no erythema and lips, buccal  mucosa, and tongue normal  NECK: supple, thyroid normal size, non-tender, without nodularity LYMPH:  no palpable lymphadenopathy in the cervical, axillary or inguinal LUNGS: clear to auscultation and percussion with normal breathing effort HEART: regular rate & rhythm and no murmurs and no lower extremity edema ABDOMEN:abdomen soft, non-tender and normal bowel sounds Musculoskeletal:no cyanosis of digits and no clubbing  NEURO: alert & oriented x 3 with fluent speech, no focal  motor/sensory deficits  LABORATORY DATA:  I have reviewed the data as listed    Component Value Date/Time   NA 143 04/23/2018 1237   K 3.8 04/23/2018 1237   CL 108 04/23/2018 1237   CO2 25 04/23/2018 1237   GLUCOSE 89 04/23/2018 1237   BUN 13 04/23/2018 1237   CREATININE 0.83 04/23/2018 1237   CREATININE 0.76 03/26/2018 1147   CALCIUM 9.9 04/23/2018 1237   PROT 7.3 04/23/2018 1237   ALBUMIN 4.0 04/23/2018 1237   AST 18 04/23/2018 1237   AST 14 (L) 03/26/2018 1147   ALT 21 04/23/2018 1237   ALT 24 03/26/2018 1147   ALKPHOS 72 04/23/2018 1237   BILITOT 0.5 04/23/2018 1237   BILITOT 1.0 03/26/2018 1147   GFRNONAA >60 04/23/2018 1237   GFRNONAA >60 03/26/2018 1147   GFRAA >60 04/23/2018 1237   GFRAA >60 03/26/2018 1147    No results found for: SPEP, UPEP  Lab Results  Component Value Date   WBC 4.8 04/23/2018   NEUTROABS 1.6 04/23/2018   HGB 12.0 04/23/2018   HCT 36.2 04/23/2018   MCV 85.6 04/23/2018   PLT 254 04/23/2018      Chemistry      Component Value Date/Time   NA 143 04/23/2018 1237   K 3.8 04/23/2018 1237   CL 108 04/23/2018 1237   CO2 25 04/23/2018 1237   BUN 13 04/23/2018 1237   CREATININE 0.83 04/23/2018 1237   CREATININE 0.76 03/26/2018 1147      Component Value Date/Time   CALCIUM 9.9 04/23/2018 1237   ALKPHOS 72 04/23/2018 1237   AST 18 04/23/2018 1237   AST 14 (L) 03/26/2018 1147   ALT 21 04/23/2018 1237   ALT 24 03/26/2018 1147   BILITOT 0.5 04/23/2018 1237   BILITOT 1.0 03/26/2018 1147       RADIOGRAPHIC STUDIES: I have reviewed multiple imaging study with patient and family I have personally reviewed the radiological images as listed and agreed with the findings in the report. Dg Chest 2 View  Result Date: 04/18/2018 CLINICAL DATA:  Cough and congestion for several days. The patient is undergoing chemotherapy for lymphoma. EXAM: CHEST - 2 VIEW COMPARISON:  Single-view of the chest 03/07/2018. PET CT scan 03/05/2018. FINDINGS: The  lungs are clear. Heart size is normal. No pneumothorax or pleural effusion. Port-A-Cath is in place with its tip in the lower superior vena cava. No bony abnormality is identified. IMPRESSION: Negative chest. Electronically Signed   By: Inge Rise M.D.   On: 04/18/2018 11:55   Nm Pet Image Restag (ps) Skull Base To Thigh  Result Date: 05/01/2018 CLINICAL DATA:  Subsequent treatment strategy for Hodgkin's lymphoma. EXAM: NUCLEAR MEDICINE PET SKULL BASE TO THIGH TECHNIQUE: 9.5 mCi F-18 FDG was injected intravenously. Full-ring PET imaging was performed from the skull base to thigh after the radiotracer. CT data was obtained and used for attenuation correction and anatomic localization. Fasting blood glucose: 80 mg/dl COMPARISON:  PET-CT 03/05/2018 FINDINGS: Mediastinal blood pool activity: SUV max 2.3 NECK: Resolution of the hypermetabolic RIGHT level  2 and RIGHT supraclavicular lymph nodes. No clear residual activity or enlarged lymph nodes remain. There is extensive brown fat hypermetabolic activity which does somewhat limit evaluation. Incidental CT findings: none CHEST: Resolution of hypermetabolic RIGHT axillary, sub pectoralis, and mediastinal adenopathy. Residual metabolic activity within lymph nodes above background blood pool. Again noted extensive hypermetabolic brown fat within the axilla, posterior neck triangles, and paraspinal fat along the vertebral bodies. Incidental CT findings: Port in the anterior chest wall with tip in distal SVC. No suspicious pulmonary nodule ABDOMEN/PELVIS: Nodes no hypermetabolic adenopathy in the abdomen pelvis. Normal splenic volume. Normal spleen metabolic activity. No pelvic lymphadenopathy. No inguinal lymphadenopathy. Incidental CT findings: IUD in expected location. SKELETON: No focal hypermetabolic activity to suggest skeletal metastasis. Incidental CT findings: none IMPRESSION: 1. Near complete resolution of metabolic activity within RIGHT cervical lymph  nodes, mediastinal nodes, RIGHT supraclavicular nodes, and axillary nodes ( Deauville 1 and 2). 2. No evidence lymphoma progression. 3. Normal spleen and marrow. 4. Extensive hypermetabolic brown fat within the neck and thorax makes evaluation challenging. Electronically Signed   By: Suzy Bouchard M.D.   On: 05/01/2018 14:40    All questions were answered. The patient knows to call the clinic with any problems, questions or concerns. No barriers to learning was detected.  I spent 25 minutes counseling the patient face to face. The total time spent in the appointment was 30 minutes and more than 50% was on counseling and review of test results  Heath Lark, MD 05/02/2018 3:06 PM

## 2018-05-02 NOTE — Assessment & Plan Note (Signed)
I have reviewed the guidelines with the patient and family She has excellent response to therapy on PET CT scan The patient has made informed decision not to pursue radiation treatment I recommend 4 more cycles of chemotherapy but plan to discontinue bleomycin and keep Vinblastine at 50% of dose due to neuropathy She agree with the plan of care I will order repeat echocardiogram next month to follow

## 2018-05-03 ENCOUNTER — Ambulatory Visit: Payer: 59

## 2018-05-03 ENCOUNTER — Other Ambulatory Visit: Payer: 59

## 2018-05-03 ENCOUNTER — Ambulatory Visit: Payer: 59 | Admitting: Hematology and Oncology

## 2018-05-09 ENCOUNTER — Telehealth: Payer: Self-pay | Admitting: *Deleted

## 2018-05-09 ENCOUNTER — Telehealth: Payer: Self-pay | Admitting: Hematology and Oncology

## 2018-05-09 NOTE — Telephone Encounter (Signed)
Called back and given below message. She verbalized understanding. She would like appt for tomorrow with Dr. Alvy Bimler. Scheduling message sent.

## 2018-05-09 NOTE — Telephone Encounter (Signed)
I;m leaving early today Either see symptom management today or I can see her tomorrow am No need labs

## 2018-05-09 NOTE — Telephone Encounter (Signed)
Scheduled appt per 10/2 sch message  - pt is aware of appt date and time   

## 2018-05-09 NOTE — Telephone Encounter (Signed)
Ad[facility]ne [name] communicated "a place on right hip has grown.  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  Really red, looks infected."    "Area actually above my right hip at love handle going towards my back.  Popped up a week ago has grown, today the size of thumbnail.  Feels warm to touch.  Developed a black crusty center with redness all around it.  Uncomfortable when pants 'run across' this area.   No breathing problems."

## 2018-05-10 ENCOUNTER — Inpatient Hospital Stay: Payer: 59 | Attending: Hematology and Oncology | Admitting: Hematology and Oncology

## 2018-05-10 ENCOUNTER — Encounter: Payer: Self-pay | Admitting: Hematology and Oncology

## 2018-05-10 DIAGNOSIS — Z87891 Personal history of nicotine dependence: Secondary | ICD-10-CM | POA: Insufficient documentation

## 2018-05-10 DIAGNOSIS — N951 Menopausal and female climacteric states: Secondary | ICD-10-CM | POA: Diagnosis not present

## 2018-05-10 DIAGNOSIS — C8170 Other classical Hodgkin lymphoma, unspecified site: Secondary | ICD-10-CM

## 2018-05-10 DIAGNOSIS — R232 Flushing: Secondary | ICD-10-CM

## 2018-05-10 DIAGNOSIS — R21 Rash and other nonspecific skin eruption: Secondary | ICD-10-CM | POA: Diagnosis not present

## 2018-05-10 DIAGNOSIS — Z5111 Encounter for antineoplastic chemotherapy: Secondary | ICD-10-CM | POA: Insufficient documentation

## 2018-05-10 DIAGNOSIS — Z79899 Other long term (current) drug therapy: Secondary | ICD-10-CM | POA: Diagnosis not present

## 2018-05-10 DIAGNOSIS — C8171 Other classical Hodgkin lymphoma, lymph nodes of head, face, and neck: Secondary | ICD-10-CM | POA: Insufficient documentation

## 2018-05-10 DIAGNOSIS — L089 Local infection of the skin and subcutaneous tissue, unspecified: Secondary | ICD-10-CM

## 2018-05-10 DIAGNOSIS — C8118 Nodular sclerosis classical Hodgkin lymphoma, lymph nodes of multiple sites: Secondary | ICD-10-CM

## 2018-05-10 DIAGNOSIS — F329 Major depressive disorder, single episode, unspecified: Secondary | ICD-10-CM | POA: Insufficient documentation

## 2018-05-10 DIAGNOSIS — F419 Anxiety disorder, unspecified: Secondary | ICD-10-CM | POA: Diagnosis not present

## 2018-05-10 NOTE — Progress Notes (Signed)
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  Skin infection She had recent pimple/boil that has since discharged It is healing well with no surrounding skin cellulitis I recommend observation only.   No orders of the defined types were placed in this encounter.   INTERVAL HISTORY: Please see below for problem oriented charting. She returns for further follow-up She had concern about a pimple near the right hip area that has got significantly large Last night, the boil discharge in her skin started to heal She denies recent fever or chills She has some hot flashes.  Denies significant neuropathy.  SUMMARY OF ONCOLOGIC HISTORY:   Classical Hodgkin lymphoma ([place])   01/22/2018 Imaging    CT scan of neck: Adenopathy throughout the right lateral neck, right supraclavicular fossa, and upper mediastinum. There is no specific imaging feature, lymphoma or atypical infection are the primary considerations and excisional biopsy should be considered if there is no diagnosis by history or labs.     01/31/2018 Pathology Results    Lymph node for lymphoma, right supraclavicular / right cervical - ATYPICAL LYMPHOID PROLIFERATION. - SEE COMMENT. Microscopic Comment Sections show a few very small needle core biopsy fragments of lymph nodal  tissue displaying a polymorphous cellular proliferation of small lymphocytes, plasma cells, eosinophils and histiocytes in addition to variable numbers of large mononuclear and multilobated lymphoid appearing cells with variably prominent nucleoli. Flow cytometric was performed ([id number]) and failed to show any monoclonal B-cell population or abnormal T-cell phenotype. Immunohistochemical stains were performed, including CD15, CD20, CD3, CD30, LCA, and PAX-5 with appropriate controls. The large atypical lymphoid appearing cells are positive for CD15, CD30, CD20 and PAX-5 and negative for CD3 and LCA. The small lymphoid component in the background is mostly composed of T cells. The morphologic and phenotypic features are atypical and highly suspicious for involvement by a lymphoproliferative process, particularly classical Hodgkin lymphoma. Excisional biopsy is recommended.    01/31/2018 Procedure    Ultrasound-guided core biopsies of right cervical lymph nodes.    02/15/2018 Pathology Results    Lymph node for lymphoma, Right Cervical - CLASSICAL HODGKIN LYMPHOMA, NODULAR SCLEROSIS TYPE    03/05/2018 PET scan    1. Hypermetabolic RIGHT level 2 lymph nodes, supraclavicular lymph nodes, sub pectoralis lymph nodes and RIGHT axillary lymph nodes. 2. Hypermetabolic mediastinal lymph nodes. 3. No evidence of lymphoma beneath the diaphragms. Normal spleen. 4. Normal bone marrow.    03/05/2018 Imaging    LV EF: 55% -  60%    03/06/2018 Cancer Staging    Staging form: Hodgkin and Non-Hodgkin Lymphoma, AJCC 8th Edition - Clinical stage from 03/06/2018: Stage II (Hodgkin lymphoma, B - Symptoms) - Signed by Heath Lark, MD on 03/06/2018    03/08/2018 -  Chemotherapy    The patient had ABVD for chemotherapy treatment.  05/01/2018 PET scan    1. Near complete resolution of metabolic activity within RIGHT cervical lymph nodes, mediastinal nodes, RIGHT supraclavicular nodes, and axillary nodes ( Deauville 1  and 2). 2. No evidence lymphoma progression. 3. Normal spleen and marrow. 4. Extensive hypermetabolic brown fat within the neck and thorax makes evaluation challenging.     REVIEW OF SYSTEMS:   Constitutional: Denies fevers, chills or abnormal weight loss Eyes: Denies blurriness of vision Ears, nose, mouth, throat, and face: Denies mucositis or sore throat Respiratory: Denies cough, dyspnea or wheezes Cardiovascular: Denies palpitation, chest discomfort or lower extremity swelling Gastrointestinal:  Denies nausea, heartburn or change in bowel habits Skin: Denies abnormal skin rashes Lymphatics: Denies new lymphadenopathy or easy bruising Neurological:Denies numbness, tingling or new weaknesses Behavioral/Psych: Mood is stable, no new changes  All other systems were reviewed with the patient and are negative.  I have reviewed the past medical history, past surgical history, social history and family history with the patient and they are unchanged from previous note.  ALLERGIES:  has No Known Allergies.  MEDICATIONS:  Current Outpatient Medications  Medication Sig Dispense Refill  . dexamethasone (DECADRON) 4 MG tablet Take 1 tablet (4 mg total) by mouth 2 (two) times daily with a meal. 60 tablet 0  . ibuprofen (ADVIL,MOTRIN) 800 MG tablet Take 1 tablet (800 mg total) by mouth every 8 (eight) hours as needed. (Patient not taking: Reported on 03/26/2018) 30 tablet 0  . lidocaine-prilocaine (EMLA) cream Apply 1 application topically as needed. 30 g 6  . LORazepam (ATIVAN) 1 MG tablet Take 1 tablet (1 mg total) by mouth every 8 (eight) hours as needed for sleep (nausea). 60 tablet 0  . magic mouthwash w/lidocaine SOLN Take 5 mLs by mouth 4 (four) times daily as needed for mouth pain. 160 mL 1  . ondansetron (ZOFRAN) 8 MG tablet Take 1 tablet (8 mg total) by mouth every 8 (eight) hours as needed. Start on the third day after chemotherapy. 30 tablet 1  . prochlorperazine (COMPAZINE) 10 MG  tablet Take 1 tablet (10 mg total) by mouth every 6 (six) hours as needed (Nausea or vomiting). 30 tablet 1  . traMADol (ULTRAM) 50 MG tablet Take 1 tablet (50 mg total) by mouth every 6 (six) hours as needed. 30 tablet 0  . triamcinolone (NASACORT) 55 MCG/ACT AERO nasal inhaler Place 2 sprays into the nose daily. 1 Inhaler 12   No current facility-administered medications for this visit.     PHYSICAL EXAMINATION: ECOG PERFORMANCE STATUS: 1 - Symptomatic but completely ambulatory  Vitals:   05/10/18 0836  BP: 104/64  Pulse: 76  Resp: 18  Temp: 98.2 F (36.8 C)  SpO2: 100%   Filed Weights   05/10/18 0836  Weight: 193 lb (87.5 kg)    GENERAL:alert, no distress and comfortable SKIN: The skin over the right hip has signs of previous infection, but appears to be healing well EYES: normal, Conjunctiva are pink and non-injected, sclera clear OROPHARYNX:no exudate, no erythema and lips, buccal mucosa, and tongue normal  NECK: supple, thyroid normal size, non-tender, without nodularity LYMPH:  no palpable lymphadenopathy in the cervical, axillary or inguinal LUNGS: clear to auscultation and percussion with normal breathing effort HEART: regular rate & rhythm and no murmurs and no lower extremity edema ABDOMEN:abdomen soft, non-tender and normal bowel sounds Musculoskeletal:no cyanosis of digits and no clubbing  NEURO: alert & oriented x 3 with fluent speech, no focal motor/sensory deficits  LABORATORY DATA:  I have  reviewed the data as listed    Component Value Date/Time   NA 143 04/23/2018 1237   K 3.8 04/23/2018 1237   CL 108 04/23/2018 1237   CO2 25 04/23/2018 1237   GLUCOSE 89 04/23/2018 1237   BUN 13 04/23/2018 1237   CREATININE 0.83 04/23/2018 1237   CREATININE 0.76 03/26/2018 1147   CALCIUM 9.9 04/23/2018 1237   PROT 7.3 04/23/2018 1237   ALBUMIN 4.0 04/23/2018 1237   AST 18 04/23/2018 1237   AST 14 (L) 03/26/2018 1147   ALT 21 04/23/2018 1237   ALT 24 03/26/2018  1147   ALKPHOS 72 04/23/2018 1237   BILITOT 0.5 04/23/2018 1237   BILITOT 1.0 03/26/2018 1147   GFRNONAA >60 04/23/2018 1237   GFRNONAA >60 03/26/2018 1147   GFRAA >60 04/23/2018 1237   GFRAA >60 03/26/2018 1147    No results found for: SPEP, UPEP  Lab Results  Component Value Date   WBC 4.8 04/23/2018   NEUTROABS 1.6 04/23/2018   HGB 12.0 04/23/2018   HCT 36.2 04/23/2018   MCV 85.6 04/23/2018   PLT 254 04/23/2018      Chemistry      Component Value Date/Time   NA 143 04/23/2018 1237   K 3.8 04/23/2018 1237   CL 108 04/23/2018 1237   CO2 25 04/23/2018 1237   BUN 13 04/23/2018 1237   CREATININE 0.83 04/23/2018 1237   CREATININE 0.76 03/26/2018 1147      Component Value Date/Time   CALCIUM 9.9 04/23/2018 1237   ALKPHOS 72 04/23/2018 1237   AST 18 04/23/2018 1237   AST 14 (L) 03/26/2018 1147   ALT 21 04/23/2018 1237   ALT 24 03/26/2018 1147   BILITOT 0.5 04/23/2018 1237   BILITOT 1.0 03/26/2018 1147       RADIOGRAPHIC STUDIES: I have personally reviewed the radiological images as listed and agreed with the findings in the report. Dg Chest 2 View  Result Date: 04/18/2018 CLINICAL DATA:  Cough and congestion for several days. The patient is undergoing chemotherapy for lymphoma. EXAM: CHEST - 2 VIEW COMPARISON:  Single-view of the chest 03/07/2018. PET CT scan 03/05/2018. FINDINGS: The lungs are clear. Heart size is normal. No pneumothorax or pleural effusion. Port-A-Cath is in place with its tip in the lower superior vena cava. No bony abnormality is identified. IMPRESSION: Negative chest. Electronically Signed   By: Inge Rise M.D.   On: 04/18/2018 11:55   Nm Pet Image Restag (ps) Skull Base To Thigh  Result Date: 05/01/2018 CLINICAL DATA:  Subsequent treatment strategy for Hodgkin's lymphoma. EXAM: NUCLEAR MEDICINE PET SKULL BASE TO THIGH TECHNIQUE: 9.5 mCi F-18 FDG was injected intravenously. Full-ring PET imaging was performed from the skull base to thigh  after the radiotracer. CT data was obtained and used for attenuation correction and anatomic localization. Fasting blood glucose: 80 mg/dl COMPARISON:  PET-CT 03/05/2018 FINDINGS: Mediastinal blood pool activity: SUV max 2.3 NECK: Resolution of the hypermetabolic RIGHT level 2 and RIGHT supraclavicular lymph nodes. No clear residual activity or enlarged lymph nodes remain. There is extensive brown fat hypermetabolic activity which does somewhat limit evaluation. Incidental CT findings: none CHEST: Resolution of hypermetabolic RIGHT axillary, sub pectoralis, and mediastinal adenopathy. Residual metabolic activity within lymph nodes above background blood pool. Again noted extensive hypermetabolic brown fat within the axilla, posterior neck triangles, and paraspinal fat along the vertebral bodies. Incidental CT findings: Port in the anterior chest wall with tip in distal SVC. No suspicious pulmonary nodule ABDOMEN/PELVIS: Nodes  no hypermetabolic adenopathy in the abdomen pelvis. Normal splenic volume. Normal spleen metabolic activity. No pelvic lymphadenopathy. No inguinal lymphadenopathy. Incidental CT findings: IUD in expected location. SKELETON: No focal hypermetabolic activity to suggest skeletal metastasis. Incidental CT findings: none IMPRESSION: 1. Near complete resolution of metabolic activity within RIGHT cervical lymph nodes, mediastinal nodes, RIGHT supraclavicular nodes, and axillary nodes ( Deauville 1 and 2). 2. No evidence lymphoma progression. 3. Normal spleen and marrow. 4. Extensive hypermetabolic brown fat within the neck and thorax makes evaluation challenging. Electronically Signed   By: Suzy Bouchard M.D.   On: 05/01/2018 14:40    All questions were answered. The patient knows to call the clinic with any problems, questions or concerns. No barriers to learning was detected.  I spent 10 minutes counseling the patient face to face. The total time spent in the appointment was 15 minutes and  more than 50% was on counseling and review of test results  Heath Lark, MD 05/10/2018 9:49 AM

## 2018-05-10 NOTE — Assessment & Plan Note (Signed)
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

## 2018-05-10 NOTE — Assessment & Plan Note (Signed)
She had recent pimple/boil that has since discharged It is healing well with no surrounding skin cellulitis I recommend observation only.

## 2018-05-10 NOTE — Assessment & Plan Note (Signed)
She has hot flashes from recent hormonal suppressive therapy We will observe for now Plan to start her on some treatment next cycle if is still bothering her a lot.

## 2018-05-11 ENCOUNTER — Inpatient Hospital Stay: Payer: 59

## 2018-05-11 VITALS — BP 118/71 | HR 56 | Temp 98.2°F | Resp 16

## 2018-05-11 DIAGNOSIS — Z3162 Encounter for fertility preservation counseling: Secondary | ICD-10-CM

## 2018-05-11 DIAGNOSIS — C8170 Other classical Hodgkin lymphoma, unspecified site: Secondary | ICD-10-CM

## 2018-05-11 DIAGNOSIS — Z5111 Encounter for antineoplastic chemotherapy: Secondary | ICD-10-CM | POA: Diagnosis not present

## 2018-05-11 LAB — CBC WITH DIFFERENTIAL/PLATELET
BASOS ABS: 0.1 10*3/uL (ref 0.0–0.1)
BASOS PCT: 1 %
EOS ABS: 0.5 10*3/uL (ref 0.0–0.5)
EOS PCT: 10 %
HCT: 36.7 % (ref 34.8–46.6)
Hemoglobin: 12.3 g/dL (ref 11.6–15.9)
Lymphocytes Relative: 33 %
Lymphs Abs: 1.6 10*3/uL (ref 0.9–3.3)
MCH: 28.9 pg (ref 25.1–34.0)
MCHC: 33.5 g/dL (ref 31.5–36.0)
MCV: 86.1 fL (ref 79.5–101.0)
MONO ABS: 0.8 10*3/uL (ref 0.1–0.9)
Monocytes Relative: 16 %
Neutro Abs: 1.9 10*3/uL (ref 1.5–6.5)
Neutrophils Relative %: 40 %
PLATELETS: 282 10*3/uL (ref 145–400)
RBC: 4.27 MIL/uL (ref 3.70–5.45)
RDW: 17 % — AB (ref 11.2–14.5)
WBC: 4.9 10*3/uL (ref 3.9–10.3)

## 2018-05-11 LAB — COMPREHENSIVE METABOLIC PANEL
ALBUMIN: 3.9 g/dL (ref 3.5–5.0)
ALT: 33 U/L (ref 0–44)
AST: 20 U/L (ref 15–41)
Alkaline Phosphatase: 72 U/L (ref 38–126)
Anion gap: 9 (ref 5–15)
BUN: 13 mg/dL (ref 6–20)
CHLORIDE: 108 mmol/L (ref 98–111)
CO2: 25 mmol/L (ref 22–32)
Calcium: 9.7 mg/dL (ref 8.9–10.3)
Creatinine, Ser: 0.74 mg/dL (ref 0.44–1.00)
GFR calc Af Amer: >60 mL/min (ref >60)
GFR calc non Af Amer: >60 mL/min (ref >60)
Glucose, Bld: 85 mg/dL (ref 70–99)
POTASSIUM: 4.3 mmol/L (ref 3.5–5.1)
SODIUM: 142 mmol/L (ref 135–145)
TOTAL PROTEIN: 7.3 g/dL (ref 6.5–8.1)
Total Bilirubin: 0.4 mg/dL (ref 0.3–1.2)

## 2018-05-11 LAB — PREGNANCY, URINE: PREG TEST UR: NEGATIVE

## 2018-05-11 MED ORDER — SODIUM CHLORIDE 0.9 % IV SOLN
Freq: Once | INTRAVENOUS | Status: AC
  Administered 2018-05-11: 12:00:00 via INTRAVENOUS
  Filled 2018-05-11: qty 5

## 2018-05-11 MED ORDER — VINBLASTINE SULFATE CHEMO INJECTION 1 MG/ML
3.0000 mg/m2 | Freq: Once | INTRAVENOUS | Status: AC
  Administered 2018-05-11: 6 mg via INTRAVENOUS
  Filled 2018-05-11: qty 6

## 2018-05-11 MED ORDER — PALONOSETRON HCL INJECTION 0.25 MG/5ML
0.2500 mg | Freq: Once | INTRAVENOUS | Status: AC
  Administered 2018-05-11: 0.25 mg via INTRAVENOUS

## 2018-05-11 MED ORDER — DOXORUBICIN HCL CHEMO IV INJECTION 2 MG/ML
25.0000 mg/m2 | Freq: Once | INTRAVENOUS | Status: AC
  Administered 2018-05-11: 50 mg via INTRAVENOUS
  Filled 2018-05-11: qty 25

## 2018-05-11 MED ORDER — HEPARIN SOD (PORK) LOCK FLUSH 100 UNIT/ML IV SOLN
500.0000 [IU] | Freq: Once | INTRAVENOUS | Status: AC | PRN
  Administered 2018-05-11: 500 [IU]
  Filled 2018-05-11: qty 5

## 2018-05-11 MED ORDER — SODIUM CHLORIDE 0.9 % IV SOLN
Freq: Once | INTRAVENOUS | Status: AC
  Administered 2018-05-11: 12:00:00 via INTRAVENOUS
  Filled 2018-05-11: qty 250

## 2018-05-11 MED ORDER — SODIUM CHLORIDE 0.9 % IV SOLN
375.0000 mg/m2 | Freq: Once | INTRAVENOUS | Status: AC
  Administered 2018-05-11: 750 mg via INTRAVENOUS
  Filled 2018-05-11: qty 75

## 2018-05-11 MED ORDER — LEUPROLIDE ACETATE 7.5 MG IM KIT
7.5000 mg | PACK | Freq: Once | INTRAMUSCULAR | Status: AC
  Administered 2018-05-11: 7.5 mg via INTRAMUSCULAR
  Filled 2018-05-11: qty 7.5

## 2018-05-11 MED ORDER — PALONOSETRON HCL INJECTION 0.25 MG/5ML
INTRAVENOUS | Status: AC
  Filled 2018-05-11: qty 5

## 2018-05-11 MED ORDER — SODIUM CHLORIDE 0.9% FLUSH
10.0000 mL | INTRAVENOUS | Status: DC | PRN
  Administered 2018-05-11: 10 mL
  Filled 2018-05-11: qty 10

## 2018-05-11 NOTE — Progress Notes (Signed)
Per Dr. Alvy Bimler, patient to receive Lupron today.

## 2018-05-11 NOTE — Patient Instructions (Signed)
[facility] Discharge Instructions for Patients Receiving Chemotherapy  Today you received the following chemotherapy agents Adriamycin, Vinblastine, and DTIC  To help prevent nausea and vomiting after your treatment, we encourage you to take your nausea medication as directed   If you develop nausea and vomiting that is not controlled by your nausea medication, call the clinic.   BELOW ARE SYMPTOMS THAT SHOULD BE REPORTED IMMEDIATELY:  *FEVER GREATER THAN 100.5 F  *CHILLS WITH OR WITHOUT FEVER  NAUSEA AND VOMITING THAT IS NOT CONTROLLED WITH YOUR NAUSEA MEDICATION  *UNUSUAL SHORTNESS OF BREATH  *UNUSUAL BRUISING OR BLEEDING  TENDERNESS IN MOUTH AND THROAT WITH OR WITHOUT PRESENCE OF ULCERS  *URINARY PROBLEMS  *BOWEL PROBLEMS  UNUSUAL RASH Items with * indicate a potential emergency and should be followed up as soon as possible.  Feel free to call the clinic should you have any questions or concerns. The clinic phone number is [phone].  Please show the CHEMO ALERT CARD at check-in to the Emergency Department and triage nurse.   

## 2018-05-17 ENCOUNTER — Other Ambulatory Visit: Payer: 59

## 2018-05-17 ENCOUNTER — Ambulatory Visit: Payer: 59

## 2018-05-17 ENCOUNTER — Ambulatory Visit: Payer: 59 | Admitting: Hematology and Oncology

## 2018-05-24 ENCOUNTER — Telehealth: Payer: Self-pay

## 2018-05-24 NOTE — Telephone Encounter (Signed)
Faxed requested form to [employer].

## 2018-05-25 ENCOUNTER — Inpatient Hospital Stay: Payer: 59

## 2018-05-25 VITALS — BP 127/62 | HR 74 | Temp 98.1°F | Resp 16

## 2018-05-25 DIAGNOSIS — C8170 Other classical Hodgkin lymphoma, unspecified site: Secondary | ICD-10-CM

## 2018-05-25 DIAGNOSIS — Z3162 Encounter for fertility preservation counseling: Secondary | ICD-10-CM

## 2018-05-25 DIAGNOSIS — R110 Nausea: Secondary | ICD-10-CM

## 2018-05-25 DIAGNOSIS — Z5111 Encounter for antineoplastic chemotherapy: Secondary | ICD-10-CM | POA: Diagnosis not present

## 2018-05-25 LAB — COMPREHENSIVE METABOLIC PANEL
ALT: 29 U/L (ref 0–44)
AST: 22 U/L (ref 15–41)
Albumin: 3.9 g/dL (ref 3.5–5.0)
Alkaline Phosphatase: 65 U/L (ref 38–126)
Anion gap: 9 (ref 5–15)
BUN: 15 mg/dL (ref 6–20)
CHLORIDE: 107 mmol/L (ref 98–111)
CO2: 25 mmol/L (ref 22–32)
Calcium: 9.7 mg/dL (ref 8.9–10.3)
Creatinine, Ser: 0.79 mg/dL (ref 0.44–1.00)
GFR calc Af Amer: >60 mL/min (ref >60)
Glucose, Bld: 114 mg/dL — ABNORMAL HIGH (ref 70–99)
POTASSIUM: 3.8 mmol/L (ref 3.5–5.1)
SODIUM: 141 mmol/L (ref 135–145)
Total Bilirubin: 0.5 mg/dL (ref 0.3–1.2)
Total Protein: 7.1 g/dL (ref 6.5–8.1)

## 2018-05-25 LAB — CBC WITH DIFFERENTIAL/PLATELET
Abs Immature Granulocytes: 0.01 10*3/uL (ref 0.00–0.07)
BASOS PCT: 1 %
Basophils Absolute: 0.1 10*3/uL (ref 0.0–0.1)
EOS ABS: 0.3 10*3/uL (ref 0.0–0.5)
EOS PCT: 7 %
HCT: 38.1 % (ref 36.0–46.0)
Hemoglobin: 12.3 g/dL (ref 12.0–15.0)
Immature Granulocytes: 0 %
Lymphocytes Relative: 29 %
Lymphs Abs: 1.3 10*3/uL (ref 0.7–4.0)
MCH: 28.1 pg (ref 26.0–34.0)
MCHC: 32.3 g/dL (ref 30.0–36.0)
MCV: 87 fL (ref 80.0–100.0)
MONO ABS: 0.5 10*3/uL (ref 0.1–1.0)
MONOS PCT: 11 %
Neutro Abs: 2.4 10*3/uL (ref 1.7–7.7)
Neutrophils Relative %: 52 %
PLATELETS: 243 10*3/uL (ref 150–400)
RBC: 4.38 MIL/uL (ref 3.87–5.11)
RDW: 15 % (ref 11.5–15.5)
WBC: 4.6 10*3/uL (ref 4.0–10.5)
nRBC: 0 % (ref 0.0–0.2)

## 2018-05-25 LAB — PREGNANCY, URINE: PREG TEST UR: NEGATIVE

## 2018-05-25 MED ORDER — SODIUM CHLORIDE 0.9 % IV SOLN
375.0000 mg/m2 | Freq: Once | INTRAVENOUS | Status: AC
  Administered 2018-05-25: 750 mg via INTRAVENOUS
  Filled 2018-05-25: qty 75

## 2018-05-25 MED ORDER — DOXORUBICIN HCL CHEMO IV INJECTION 2 MG/ML
25.0000 mg/m2 | Freq: Once | INTRAVENOUS | Status: AC
  Administered 2018-05-25: 50 mg via INTRAVENOUS
  Filled 2018-05-25: qty 25

## 2018-05-25 MED ORDER — SODIUM CHLORIDE 0.9 % IV SOLN
Freq: Once | INTRAVENOUS | Status: AC
  Administered 2018-05-25: 11:00:00 via INTRAVENOUS
  Filled 2018-05-25: qty 250

## 2018-05-25 MED ORDER — SODIUM CHLORIDE 0.9 % IV SOLN
Freq: Once | INTRAVENOUS | Status: AC
  Administered 2018-05-25: 11:00:00 via INTRAVENOUS
  Filled 2018-05-25: qty 5

## 2018-05-25 MED ORDER — HEPARIN SOD (PORK) LOCK FLUSH 100 UNIT/ML IV SOLN
500.0000 [IU] | Freq: Once | INTRAVENOUS | Status: AC | PRN
  Administered 2018-05-25: 500 [IU]
  Filled 2018-05-25: qty 5

## 2018-05-25 MED ORDER — SODIUM CHLORIDE 0.9% FLUSH
10.0000 mL | INTRAVENOUS | Status: DC | PRN
  Administered 2018-05-25: 10 mL
  Filled 2018-05-25: qty 10

## 2018-05-25 MED ORDER — PALONOSETRON HCL INJECTION 0.25 MG/5ML
INTRAVENOUS | Status: AC
  Filled 2018-05-25: qty 5

## 2018-05-25 MED ORDER — SODIUM CHLORIDE 0.9% FLUSH
10.0000 mL | Freq: Once | INTRAVENOUS | Status: AC
  Administered 2018-05-25: 10 mL
  Filled 2018-05-25: qty 10

## 2018-05-25 MED ORDER — LORAZEPAM 2 MG/ML IJ SOLN
0.5000 mg | Freq: Once | INTRAMUSCULAR | Status: AC
  Administered 2018-05-25: 0.5 mg via INTRAVENOUS

## 2018-05-25 MED ORDER — LORAZEPAM 2 MG/ML IJ SOLN
INTRAMUSCULAR | Status: AC
  Filled 2018-05-25: qty 1

## 2018-05-25 MED ORDER — VINBLASTINE SULFATE CHEMO INJECTION 1 MG/ML
3.0000 mg/m2 | Freq: Once | INTRAVENOUS | Status: AC
  Administered 2018-05-25: 6 mg via INTRAVENOUS
  Filled 2018-05-25: qty 6

## 2018-05-25 MED ORDER — PALONOSETRON HCL INJECTION 0.25 MG/5ML
0.2500 mg | Freq: Once | INTRAVENOUS | Status: AC
  Administered 2018-05-25: 0.25 mg via INTRAVENOUS

## 2018-05-25 NOTE — Progress Notes (Signed)
During Vinblastine infusion, pt began c/o break through nausea. On call MD (Dr. Lindi Adie) notified and received a verbal order for 0.5 mg Ativan IV. Vinblastine infusion paused and line flushed with saline. Ativan orders placed and administered at 12:22. Line flushed with saline again and Vinblastine infusion restarted. Pt instructed to notify the RN if nausea does not improve or worsens. Pt verbalized understanding and agreement.

## 2018-05-25 NOTE — Patient Instructions (Signed)
[facility] Discharge Instructions for Patients Receiving Chemotherapy  Today you received the following chemotherapy agents:  doxorubicin (Adriamycin), vinblastine (Velban), and dacarbazine (DTIC).  To help prevent nausea and vomiting after your treatment, we encourage you to take your nausea medication as directed.   If you develop nausea and vomiting that is not controlled by your nausea medication, call the clinic.   BELOW ARE SYMPTOMS THAT SHOULD BE REPORTED IMMEDIATELY:  *FEVER GREATER THAN 100.5 F  *CHILLS WITH OR WITHOUT FEVER  NAUSEA AND VOMITING THAT IS NOT CONTROLLED WITH YOUR NAUSEA MEDICATION  *UNUSUAL SHORTNESS OF BREATH  *UNUSUAL BRUISING OR BLEEDING  TENDERNESS IN MOUTH AND THROAT WITH OR WITHOUT PRESENCE OF ULCERS  *URINARY PROBLEMS  *BOWEL PROBLEMS  UNUSUAL RASH Items with * indicate a potential emergency and should be followed up as soon as possible.  Feel free to call the clinic should you have any questions or concerns. The clinic phone number is [phone].  Please show the CHEMO ALERT CARD at check-in to the Emergency Department and triage nurse.   

## 2018-06-04 ENCOUNTER — Telehealth: Payer: Self-pay | Admitting: Oncology

## 2018-06-04 ENCOUNTER — Encounter: Payer: Self-pay | Admitting: Medical

## 2018-06-04 NOTE — Telephone Encounter (Signed)
NG out of office 10/30 thru 11/29 - moved 11/1 f/u to [facility] Spoke with patient.

## 2018-06-05 ENCOUNTER — Ambulatory Visit (HOSPITAL_COMMUNITY)
Admission: RE | Admit: 2018-06-05 | Discharge: 2018-06-05 | Disposition: A | Discharge status: Home / Self Care | Payer: 59 | Source: Ambulatory Visit | Attending: Hematology and Oncology | Admitting: Hematology and Oncology

## 2018-06-05 ENCOUNTER — Telehealth: Payer: Self-pay

## 2018-06-05 DIAGNOSIS — Z5111 Encounter for antineoplastic chemotherapy: Secondary | ICD-10-CM | POA: Diagnosis not present

## 2018-06-05 DIAGNOSIS — I361 Nonrheumatic tricuspid (valve) insufficiency: Secondary | ICD-10-CM | POA: Insufficient documentation

## 2018-06-05 DIAGNOSIS — Z8571 Personal history of Hodgkin lymphoma: Secondary | ICD-10-CM | POA: Diagnosis not present

## 2018-06-05 NOTE — Progress Notes (Signed)
  Echocardiogram 2D Echocardiogram has been performed.  Merrie Roof F 06/05/2018, 12:08 PM

## 2018-06-05 NOTE — Telephone Encounter (Signed)
-----   Message from Heath Lark, MD sent at 06/05/2018  2:06 PM EDT ----- Regarding: ECHo ok pls call and let her know ----- Message ----- From: Interface, [employer] Sent: 06/05/2018   2:04 PM EDT To: Heath Lark, MD

## 2018-06-05 NOTE — Telephone Encounter (Signed)
Called and left below message, instructed to call the office for questions.

## 2018-06-07 ENCOUNTER — Inpatient Hospital Stay (HOSPITAL_BASED_OUTPATIENT_CLINIC_OR_DEPARTMENT_OTHER): Payer: 59 | Admitting: Medical

## 2018-06-07 VITALS — BP 113/82 | HR 82 | Temp 99.1°F | Resp 16

## 2018-06-07 DIAGNOSIS — C8171 Other classical Hodgkin lymphoma, lymph nodes of head, face, and neck: Secondary | ICD-10-CM | POA: Diagnosis not present

## 2018-06-07 DIAGNOSIS — R21 Rash and other nonspecific skin eruption: Secondary | ICD-10-CM | POA: Diagnosis not present

## 2018-06-07 DIAGNOSIS — C8170 Other classical Hodgkin lymphoma, unspecified site: Secondary | ICD-10-CM

## 2018-06-07 DIAGNOSIS — Z5111 Encounter for antineoplastic chemotherapy: Secondary | ICD-10-CM | POA: Diagnosis not present

## 2018-06-07 DIAGNOSIS — F419 Anxiety disorder, unspecified: Secondary | ICD-10-CM

## 2018-06-07 DIAGNOSIS — Z87891 Personal history of nicotine dependence: Secondary | ICD-10-CM

## 2018-06-07 DIAGNOSIS — F329 Major depressive disorder, single episode, unspecified: Secondary | ICD-10-CM

## 2018-06-07 MED ORDER — TRIAMCINOLONE ACETONIDE 0.025 % EX CREA: 1.0000 "application " | TOPICAL_CREAM | Freq: Two times a day (BID) | CUTANEOUS | 1 refills | Status: DC

## 2018-06-07 NOTE — Progress Notes (Signed)
Pt presents with burning, itching, red and blotchy rash with slightly elevated region on upper L side of chest.  Per pt rash has spread down chest to between the breasts and has been present since a week ago.  Afebrile.  Denies SOB or trouble swallowing.  Speaks in full sentences.

## 2018-06-07 NOTE — Patient Instructions (Signed)

## 2018-06-08 ENCOUNTER — Encounter: Payer: Self-pay | Admitting: Oncology

## 2018-06-08 ENCOUNTER — Inpatient Hospital Stay: Payer: 59

## 2018-06-08 ENCOUNTER — Telehealth: Payer: Self-pay | Admitting: Hematology and Oncology

## 2018-06-08 ENCOUNTER — Inpatient Hospital Stay: Payer: 59 | Attending: Hematology and Oncology | Admitting: Oncology

## 2018-06-08 ENCOUNTER — Telehealth: Payer: Self-pay | Admitting: *Deleted

## 2018-06-08 VITALS — BP 123/72 | HR 66 | Temp 98.0°F | Resp 17 | Ht 69.0 in | Wt 205.6 lb

## 2018-06-08 DIAGNOSIS — Z5111 Encounter for antineoplastic chemotherapy: Secondary | ICD-10-CM | POA: Diagnosis not present

## 2018-06-08 DIAGNOSIS — R232 Flushing: Secondary | ICD-10-CM

## 2018-06-08 DIAGNOSIS — Z3162 Encounter for fertility preservation counseling: Secondary | ICD-10-CM

## 2018-06-08 DIAGNOSIS — R21 Rash and other nonspecific skin eruption: Secondary | ICD-10-CM

## 2018-06-08 DIAGNOSIS — F329 Major depressive disorder, single episode, unspecified: Secondary | ICD-10-CM

## 2018-06-08 DIAGNOSIS — C8171 Other classical Hodgkin lymphoma, lymph nodes of head, face, and neck: Secondary | ICD-10-CM

## 2018-06-08 DIAGNOSIS — Z79899 Other long term (current) drug therapy: Secondary | ICD-10-CM

## 2018-06-08 DIAGNOSIS — C8170 Other classical Hodgkin lymphoma, unspecified site: Secondary | ICD-10-CM

## 2018-06-08 DIAGNOSIS — T451X5A Adverse effect of antineoplastic and immunosuppressive drugs, initial encounter: Secondary | ICD-10-CM

## 2018-06-08 DIAGNOSIS — G620 Drug-induced polyneuropathy: Secondary | ICD-10-CM | POA: Diagnosis not present

## 2018-06-08 LAB — COMPREHENSIVE METABOLIC PANEL
ALT: 32 U/L (ref 0–44)
AST: 22 U/L (ref 15–41)
Albumin: 3.8 g/dL (ref 3.5–5.0)
Alkaline Phosphatase: 64 U/L (ref 38–126)
Anion gap: 11 (ref 5–15)
BILIRUBIN TOTAL: 0.7 mg/dL (ref 0.3–1.2)
BUN: 15 mg/dL (ref 6–20)
CHLORIDE: 105 mmol/L (ref 98–111)
CO2: 25 mmol/L (ref 22–32)
Calcium: 9.6 mg/dL (ref 8.9–10.3)
Creatinine, Ser: 0.85 mg/dL (ref 0.44–1.00)
Glucose, Bld: 110 mg/dL — ABNORMAL HIGH (ref 70–99)
Potassium: 3.8 mmol/L (ref 3.5–5.1)
Sodium: 141 mmol/L (ref 135–145)
TOTAL PROTEIN: 7.1 g/dL (ref 6.5–8.1)

## 2018-06-08 LAB — CBC WITH DIFFERENTIAL/PLATELET
ABS IMMATURE GRANULOCYTES: 0.02 10*3/uL (ref 0.00–0.07)
Basophils Absolute: 0.1 10*3/uL (ref 0.0–0.1)
Basophils Relative: 1 %
EOS ABS: 0.3 10*3/uL (ref 0.0–0.5)
Eosinophils Relative: 5 %
HCT: 37.7 % (ref 36.0–46.0)
Hemoglobin: 12.3 g/dL (ref 12.0–15.0)
Immature Granulocytes: 0 %
Lymphocytes Relative: 33 %
Lymphs Abs: 1.6 10*3/uL (ref 0.7–4.0)
MCH: 28.7 pg (ref 26.0–34.0)
MCHC: 32.6 g/dL (ref 30.0–36.0)
MCV: 88.1 fL (ref 80.0–100.0)
MONO ABS: 0.5 10*3/uL (ref 0.1–1.0)
MONOS PCT: 9 %
Neutro Abs: 2.6 10*3/uL (ref 1.7–7.7)
Neutrophils Relative %: 52 %
PLATELETS: 274 10*3/uL (ref 150–400)
RBC: 4.28 MIL/uL (ref 3.87–5.11)
RDW: 15.2 % (ref 11.5–15.5)
WBC: 5 10*3/uL (ref 4.0–10.5)
nRBC: 0 % (ref 0.0–0.2)

## 2018-06-08 LAB — PREGNANCY, URINE: PREG TEST UR: NEGATIVE

## 2018-06-08 MED ORDER — SODIUM CHLORIDE 0.9% FLUSH
10.0000 mL | INTRAVENOUS | Status: DC | PRN
  Administered 2018-06-08: 10 mL
  Filled 2018-06-08: qty 10

## 2018-06-08 MED ORDER — VINBLASTINE SULFATE CHEMO INJECTION 1 MG/ML
3.0000 mg/m2 | Freq: Once | INTRAVENOUS | Status: AC
  Administered 2018-06-08: 6 mg via INTRAVENOUS
  Filled 2018-06-08: qty 6

## 2018-06-08 MED ORDER — SODIUM CHLORIDE 0.9 % IV SOLN
Freq: Once | INTRAVENOUS | Status: AC
  Administered 2018-06-08: 11:00:00 via INTRAVENOUS
  Filled 2018-06-08: qty 250

## 2018-06-08 MED ORDER — DOXORUBICIN HCL CHEMO IV INJECTION 2 MG/ML
25.0000 mg/m2 | Freq: Once | INTRAVENOUS | Status: AC
  Administered 2018-06-08: 50 mg via INTRAVENOUS
  Filled 2018-06-08: qty 25

## 2018-06-08 MED ORDER — SODIUM CHLORIDE 0.9 % IV SOLN
375.0000 mg/m2 | Freq: Once | INTRAVENOUS | Status: AC
  Administered 2018-06-08: 750 mg via INTRAVENOUS
  Filled 2018-06-08: qty 75

## 2018-06-08 MED ORDER — LEUPROLIDE ACETATE 7.5 MG IM KIT
7.5000 mg | PACK | Freq: Once | INTRAMUSCULAR | Status: AC
  Administered 2018-06-08: 7.5 mg via INTRAMUSCULAR
  Filled 2018-06-08: qty 7.5

## 2018-06-08 MED ORDER — PALONOSETRON HCL INJECTION 0.25 MG/5ML
0.2500 mg | Freq: Once | INTRAVENOUS | Status: AC
  Administered 2018-06-08: 0.25 mg via INTRAVENOUS

## 2018-06-08 MED ORDER — SODIUM CHLORIDE 0.9 % IV SOLN
Freq: Once | INTRAVENOUS | Status: AC
  Administered 2018-06-08: 11:00:00 via INTRAVENOUS
  Filled 2018-06-08: qty 5

## 2018-06-08 MED ORDER — SODIUM CHLORIDE 0.9% FLUSH
10.0000 mL | Freq: Once | INTRAVENOUS | Status: AC
  Administered 2018-06-08: 10 mL
  Filled 2018-06-08: qty 10

## 2018-06-08 MED ORDER — HEPARIN SOD (PORK) LOCK FLUSH 100 UNIT/ML IV SOLN
500.0000 [IU] | Freq: Once | INTRAVENOUS | Status: AC | PRN
  Administered 2018-06-08: 500 [IU]
  Filled 2018-06-08: qty 5

## 2018-06-08 MED ORDER — PALONOSETRON HCL INJECTION 0.25 MG/5ML
INTRAVENOUS | Status: AC
  Filled 2018-06-08: qty 5

## 2018-06-08 NOTE — Telephone Encounter (Signed)
Appts scheduled avs/calendar printed per 11/1 los °

## 2018-06-08 NOTE — Patient Instructions (Signed)

## 2018-06-08 NOTE — Telephone Encounter (Signed)
"  Adrienne [name] calling because I sent [name] a message with a picture of my port-a-cath.  It's getting worse.  I think I'm having an allergic reaction to something; but it's getting bigger, itchy, burns and is textured.  I need to be seen if you can give me a call back [phone])."  See today's call contact information with this note.

## 2018-06-08 NOTE — Assessment & Plan Note (Signed)
This is a pleasant 27 year old Caucasian female with Hodgkin lymphoma.  Currently receiving chemotherapy with ABVD.  Bleomycin has been discontinued and vinblastine has been dose reduced by 50% secondary to neuropathy.  Tolerated her last cycle of chemotherapy well overall.  Labs from today have been reviewed.  She will proceed with chemotherapy today as scheduled.  She will follow-up in 2 weeks for labs and chemotherapy and have a follow-up visit in 4 weeks prior to starting cycle #5.

## 2018-06-08 NOTE — Assessment & Plan Note (Signed)
The patient was seen in symptom management clinic yesterday for a rash to her Port-A-Cath.  She was started on hydrocortisone cream and the rash has improved.  Rash appears to be secondary to coming into contact with something.  Suspect it may be related to the dressing material.  Continue hydrocortisone.  We discussed that she may use an antihistamine such as Benadryl, Zyrtec, or Claritin for itching.  She will let us know if the rash worsens.

## 2018-06-08 NOTE — Patient Instructions (Signed)
[facility] Discharge Instructions for Patients Receiving Chemotherapy  Today you received the following chemotherapy agents:  doxorubicin (Adriamycin), vinblastine (Velban), and dacarbazine (DTIC).  To help prevent nausea and vomiting after your treatment, we encourage you to take your nausea medication as directed.   If you develop nausea and vomiting that is not controlled by your nausea medication, call the clinic.   BELOW ARE SYMPTOMS THAT SHOULD BE REPORTED IMMEDIATELY:  *FEVER GREATER THAN 100.5 F  *CHILLS WITH OR WITHOUT FEVER  NAUSEA AND VOMITING THAT IS NOT CONTROLLED WITH YOUR NAUSEA MEDICATION  *UNUSUAL SHORTNESS OF BREATH  *UNUSUAL BRUISING OR BLEEDING  TENDERNESS IN MOUTH AND THROAT WITH OR WITHOUT PRESENCE OF ULCERS  *URINARY PROBLEMS  *BOWEL PROBLEMS  UNUSUAL RASH Items with * indicate a potential emergency and should be followed up as soon as possible.  Feel free to call the clinic should you have any questions or concerns. The clinic phone number is [phone].  Please show the CHEMO ALERT CARD at check-in to the Emergency Department and triage nurse.   

## 2018-06-08 NOTE — Assessment & Plan Note (Signed)
Reports that the peripheral neuropathy is intermittent.  Does not want any medication for this at this time.  Vinblastine has been dose reduced by 50%.  Continue to monitor.

## 2018-06-08 NOTE — Progress Notes (Signed)
[place] OFFICE PROGRESS NOTE  [name], MD [place] [address]    Classical Hodgkin lymphoma [facility])   01/22/2018 Imaging    CT scan of neck: Adenopathy throughout the right lateral neck, right supraclavicular fossa, and upper mediastinum. There is no specific imaging feature, lymphoma or atypical infection are the primary considerations and excisional biopsy should be considered if there is no diagnosis by history or labs.     01/31/2018 Pathology Results    Lymph node for lymphoma, right supraclavicular / right cervical - ATYPICAL LYMPHOID PROLIFERATION. - SEE COMMENT. Microscopic Comment Sections show a few very small needle core biopsy fragments of lymph nodal tissue displaying a polymorphous cellular proliferation of small lymphocytes, plasma cells, eosinophils and histiocytes in addition to variable numbers of large mononuclear and multilobated lymphoid appearing cells with variably prominent nucleoli. Flow cytometric was performed ([id number]) and failed to show any monoclonal B-cell population or abnormal T-cell phenotype. Immunohistochemical stains were performed, including CD15, CD20, CD3, CD30, LCA, and PAX-5 with appropriate controls. The large atypical lymphoid appearing cells are positive for CD15, CD30, CD20 and PAX-5 and negative for CD3 and LCA. The small lymphoid component in the background is mostly composed of T cells. The morphologic and phenotypic features are atypical and highly suspicious for involvement by a lymphoproliferative process, particularly classical Hodgkin lymphoma. Excisional biopsy is recommended.    01/31/2018 Procedure    Ultrasound-guided core biopsies of right cervical lymph nodes.    02/15/2018 Pathology Results    Lymph node for lymphoma, Right Cervical - CLASSICAL HODGKIN LYMPHOMA, NODULAR SCLEROSIS TYPE    03/05/2018 PET scan    1. Hypermetabolic RIGHT level 2 lymph nodes, supraclavicular lymph  nodes, sub pectoralis lymph nodes and RIGHT axillary lymph nodes. 2. Hypermetabolic mediastinal lymph nodes. 3. No evidence of lymphoma beneath the diaphragms. Normal spleen. 4. Normal bone marrow.    03/05/2018 Imaging    LV EF: 55% -  60%    03/06/2018 Cancer Staging    Staging form: Hodgkin and Non-Hodgkin Lymphoma, AJCC 8th Edition - Clinical stage from 03/06/2018: Stage II (Hodgkin lymphoma, B - Symptoms) - Signed by Heath Lark, MD on 03/06/2018    03/08/2018 -  Chemotherapy    The patient had ABVD for chemotherapy treatment. After interim scan showed complete response, Bleomycin was discontinued     05/01/2018 PET scan    1. Near complete resolution of metabolic activity within RIGHT cervical lymph nodes, mediastinal nodes, RIGHT supraclavicular nodes, and axillary nodes ( [place] 1 and 2). 2. No evidence lymphoma progression. 3. Normal spleen and marrow. 4. Extensive hypermetabolic brown fat within the neck and thorax makes evaluation challenging.    06/05/2018 Echocardiogram    LV EF: 60% -  65%     CURRENT THERAPY: ABVD.  Today is day 1 of cycle 4.  Bleomycin has been discontinued and vinblastine has been dose reduced by 50% secondary to peripheral neuropathy.  INTERVAL HISTORY: Adrienne [name] 27 y.o. female returns for routine follow-up visit accompanied by her family member.  The patient continues to tolerate treatment fairly well.  She did report 2 episodes of vomiting since her last visit.  She has antiemetics available to her but did not take any of these.  Her vomiting has resolved.  She denies fevers and chills.  Denies chest pain, shortness of breath, cough, hemoptysis.  She has no nausea or vomiting today.  Denies constipation and diarrhea.  She reports a good  appetite and she has been gaining weight.  Peripheral neuropathy comes and goes.  Denies adenopathy.  Denies night sweats.  Continues to have hot flashes which are intermittent.  Reports rash near the Port-A-Cath  site.  Was seen in symptomatic clinic yesterday and given steroid cream for this.  Rash has improved significantly.  The patient is here for evaluation prior to her next cycle of chemotherapy.  MEDICAL HISTORY: Past Medical History:  Diagnosis Date  . Anxiety 2019  . Depression 2016  . Dyspnea 01/2009   with sternal area discomfort, Pt questions anxiety  . Frequent UTI   . Neutropenic fever ([place]) 04/18/2018    ALLERGIES:  has No Known Allergies.  MEDICATIONS:  Current Outpatient Medications  Medication Sig Dispense Refill  . dexamethasone (DECADRON) 4 MG tablet Take 1 tablet (4 mg total) by mouth 2 (two) times daily with a meal. (Patient not taking: Reported on 06/08/2018) 60 tablet 0  . ibuprofen (ADVIL,MOTRIN) 800 MG tablet Take 1 tablet (800 mg total) by mouth every 8 (eight) hours as needed. (Patient not taking: Reported on 06/08/2018) 30 tablet 0  . levonorgestrel (MIRENA, 52 MG,) 20 MCG/24HR IUD Mirena 20 mcg/24 hours (5 yrs) 52 mg intrauterine device  Take 1 device by intrauterine route.    . lidocaine-prilocaine (EMLA) cream Apply 1 application topically as needed. (Patient not taking: Reported on 06/08/2018) 30 g 6  . LORazepam (ATIVAN) 1 MG tablet Take 1 tablet (1 mg total) by mouth every 8 (eight) hours as needed for sleep (nausea). (Patient not taking: Reported on 06/08/2018) 60 tablet 0  . magic mouthwash w/lidocaine SOLN Take 5 mLs by mouth 4 (four) times daily as needed for mouth pain. (Patient not taking: Reported on 06/08/2018) 160 mL 1  . ondansetron (ZOFRAN) 8 MG tablet Take 1 tablet (8 mg total) by mouth every 8 (eight) hours as needed. Start on the third day after chemotherapy. (Patient not taking: Reported on 06/08/2018) 30 tablet 1  . prochlorperazine (COMPAZINE) 10 MG tablet Take 1 tablet (10 mg total) by mouth every 6 (six) hours as needed (Nausea or vomiting). (Patient not taking: Reported on 06/08/2018) 30 tablet 1  . traMADol (ULTRAM) 50 MG tablet Take 1 tablet (50 mg  total) by mouth every 6 (six) hours as needed. (Patient not taking: Reported on 06/08/2018) 30 tablet 0  . Tretinoin Microsphere (RETIN-A MICRO PUMP) 0.08 % GEL Retin-A Micro Pump 0.08 % topical gel  APPLY AA QHS    . triamcinolone (KENALOG) 0.025 % cream Apply 1 application topically 2 (two) times daily. (Patient not taking: Reported on 06/08/2018) 30 g 1  . triamcinolone (NASACORT) 55 MCG/ACT AERO nasal inhaler Place 2 sprays into the nose daily. (Patient not taking: Reported on 06/08/2018) 1 Inhaler 12   No current facility-administered medications for this visit.    Facility-Administered Medications Ordered in Other Visits  Medication Dose Route Frequency Provider Last Rate Last Dose  . dacarbazine (DTIC) 750 mg in sodium chloride 0.9 % 250 mL chemo infusion  375 mg/m2 (Treatment Plan Recorded) Intravenous Once Gorsuch, Ni, MD      . heparin lock flush 100 unit/mL  500 Units Intracatheter Once PRN Alvy Bimler, Ni, MD      . sodium chloride flush (NS) 0.9 % injection 10 mL  10 mL Intracatheter PRN Heath Lark, MD        SURGICAL HISTORY:  Past Surgical History:  Procedure Laterality Date  . INDUCED ABORTION    . LYMPH NODE BIOPSY Right  02/15/2018   Procedure: RIGHT CERVICAL LYMPH NODE EXCISIONAL BIOPSY;  Surgeon: Erroll Luna, MD;  Location: [place] [facility];  Service: General;  Laterality: Right;  . MOUTH SURGERY    . PORTACATH PLACEMENT Left 03/07/2018   Procedure: INSERTION PORT-A-CATH WITH ULTRASOUND;  Surgeon: Erroll Luna, MD;  Location: [facility];  Service: General;  Laterality: Left;  . WRIST SURGERY      REVIEW OF SYSTEMS:   Review of Systems  Constitutional: Negative for appetite change, chills, fatigue, fever and unexpected weight change.  HENT:   Negative for mouth sores, nosebleeds, sore throat and trouble swallowing.   Eyes: Negative for eye problems and icterus.  Respiratory: Negative for cough, hemoptysis, shortness of breath and wheezing.    Cardiovascular: Negative for chest pain and leg swelling.  Gastrointestinal: Negative for abdominal pain, constipation, diarrhea, nausea and vomiting.  Genitourinary: Negative for bladder incontinence, difficulty urinating, dysuria, frequency and hematuria.   Musculoskeletal: Negative for back pain, gait problem, neck pain and neck stiffness.  Skin: Positive for rash to her Port-A-Cath site.  No drainage.  Using hydrocortisone cream which is helping. Neurological: Negative for dizziness, extremity weakness, gait problem, headaches, light-headedness and seizures.  Positive for peripheral neuropathy which is intermittent. Hematological: Negative for adenopathy. Does not bruise/bleed easily.  Psychiatric/Behavioral: Negative for confusion, depression and sleep disturbance. The patient is not nervous/anxious.     PHYSICAL EXAMINATION:  Blood pressure 123/72, pulse 66, temperature 98 F (36.7 C), temperature source Oral, resp. rate 17, height 5\' 9"  (1.753 m), weight 205 lb 9.6 oz (93.3 kg), SpO2 98 %.  ECOG PERFORMANCE STATUS: 1 - Symptomatic but completely ambulatory  Physical Exam  Constitutional: Oriented to person, place, and time and well-developed, well-nourished, and in no distress. No distress.  HENT:  Head: Normocephalic and atraumatic.  Mouth/Throat: Oropharynx is clear and moist. No oropharyngeal exudate.  Eyes: Conjunctivae are normal. Right eye exhibits no discharge. Left eye exhibits no discharge. No scleral icterus.  Neck: Normal range of motion. Neck supple.  Cardiovascular: Normal rate, regular rhythm, normal heart sounds and intact distal pulses.   Pulmonary/Chest: Effort normal and breath sounds normal. No respiratory distress. No wheezes. No rales.  Abdominal: Soft. Bowel sounds are normal. Exhibits no distension and no mass. There is no tenderness.  Musculoskeletal: Normal range of motion. Exhibits no edema.  Lymphadenopathy:    No cervical adenopathy.  Neurological:  Alert and oriented to person, place, and time. Exhibits normal muscle tone. Gait normal. Coordination normal.  Skin: Skin is warm and dry.  Faint rash noted to the outer aspect of the Port-A-Cath. Psychiatric: Mood, memory and judgment normal.  Vitals reviewed.  LABORATORY DATA: Lab Results  Component Value Date   WBC 5.0 06/08/2018   HGB 12.3 06/08/2018   HCT 37.7 06/08/2018   MCV 88.1 06/08/2018   PLT 274 06/08/2018      Chemistry      Component Value Date/Time   NA 141 06/08/2018 0845   K 3.8 06/08/2018 0845   CL 105 06/08/2018 0845   CO2 25 06/08/2018 0845   BUN 15 06/08/2018 0845   CREATININE 0.85 06/08/2018 0845   CREATININE 0.76 03/26/2018 1147      Component Value Date/Time   CALCIUM 9.6 06/08/2018 0845   ALKPHOS 64 06/08/2018 0845   AST 22 06/08/2018 0845   AST 14 (L) 03/26/2018 1147   ALT 32 06/08/2018 0845   ALT 24 03/26/2018 1147   BILITOT 0.7 06/08/2018 0845   BILITOT  1.0 03/26/2018 1147       RADIOGRAPHIC STUDIES:  No results found.   ASSESSMENT/PLAN:  Classical Hodgkin lymphoma ([place]) This is a pleasant 27 year old Caucasian female with Hodgkin lymphoma.  Currently receiving chemotherapy with ABVD.  Bleomycin has been discontinued and vinblastine has been dose reduced by 50% secondary to neuropathy.  Tolerated her last cycle of chemotherapy well overall.  Labs from today have been reviewed.  She will proceed with chemotherapy today as scheduled.  She will follow-up in 2 weeks for labs and chemotherapy and have a follow-up visit in 4 weeks prior to starting cycle #5.  Peripheral neuropathy due to chemotherapy [facility]) Reports that the peripheral neuropathy is intermittent.  Does not want any medication for this at this time.  Vinblastine has been dose reduced by 50%.  Continue to monitor.  Hot flashes Hot flashes are ongoing.  Due to recent hormonal suppressive therapy.  We discussed medications for treatment of her hot flashes but she has declined this  today.  Continue to monitor.  Rash The patient was seen in symptom management clinic yesterday for a rash to her Port-A-Cath.  She was started on hydrocortisone cream and the rash has improved.  Rash appears to be secondary to coming into contact with something.  Suspect it may be related to the dressing material.  Continue hydrocortisone.  We discussed that she may use an antihistamine such as Benadryl, Zyrtec, or Claritin for itching.  She will let us know if the rash worsens.   No orders of the defined types were placed in this encounter.    Mikey Bussing, DNP, AGPCNP-BC, AOCNP 06/08/18

## 2018-06-08 NOTE — Assessment & Plan Note (Signed)
Hot flashes are ongoing.  Due to recent hormonal suppressive therapy.  We discussed medications for treatment of her hot flashes but she has declined this today.  Continue to monitor.

## 2018-06-11 ENCOUNTER — Encounter: Payer: Self-pay | Admitting: Hematology and Oncology

## 2018-06-11 NOTE — Progress Notes (Signed)
[place] [employer] and documents were left on m desk last week.  Placed in envelope and sent to Customer Service via interoffice mail.

## 2018-06-13 NOTE — Progress Notes (Signed)
FMLA successfully faxed to [employer] at [phone]. Mailed copy to patient address on file. 

## 2018-06-13 NOTE — Progress Notes (Signed)
Symptoms Management Clinic Progress Note   Adrienne [name] [id number] 01-20-1991 27 y.o.  Adrienne [name] is managed by Dr. Heath Lark  Actively treated with chemotherapy/immunotherapy: yes  Current Therapy: dacarbazine, Adriamycin, and vinblastine  Last Treated: 05/25/2018 (cycle 3, day 15)  Assessment: Plan:    Rash - Plan: triamcinolone (KENALOG) 0.025 % cream  Classical Hodgkin lymphoma (H[facility])   Rash of the left chest wall and intramammary area: The patient was instructed to begin using an over-the-counter hydrocortisone cream to the area.  She was additionally given a prescription of triamcinolone 0.025% cream to use twice daily if she did not respond to the hydrocortisone cream.  Classic Hodgkin's lymphoma: The patient will return tomorrow for cycle 4, day 1 of dacarbazine, Adriamycin, and vinblastine.  She will return for treatment only on 06/22/2018 and will be seen in follow-up on 07/06/2018.  Please see After Visit Summary for patient specific instructions.  Future Appointments  Date Time Provider Rudy  06/22/2018  8:15 AM [facility] LAB 6 [facility] None  06/22/2018  8:30 AM [facility] [place] FLUSH [facility] None  06/22/2018  9:30 AM [facility] INFUSION [facility] None  07/06/2018 10:45 AM [facility] LAB 5 [facility] None  07/06/2018 11:00 AM [facility] [place] FLUSH [facility] None  07/06/2018 11:30 AM Curcio, Roselie Awkward, NP [facility] None  07/06/2018 12:15 PM [facility] INFUSION [facility] None    No orders of the defined types were placed in this encounter.      Subjective:   Patient ID:  Adrienne [name] is a 27 y.o. (DOB 06/15/1991) female.  Chief Complaint:  Chief Complaint  Patient presents with  . Rash    HPI Adrienne [name] is a 27 year old female with a history of a classic Hodgkin's lymphoma who is managed by Dr. Heath Lark.  She presents to the office today with a report of a pruritic, erythematous, and raised rash over her left  chest wall.  Her port area is spared.  She denies any changes in medications, over-the-counter supplements or personal hygiene products.  She denies shortness of breath or difficulty swallowing.  She has had no recent upper respiratory infections.  She is scheduled to return tomorrow for cycle 4, day 1 of dacarbazine, Adriamycin, and vinblastine.   She is concerned that this rash will interfere with her treatment tomorrow.  Medications: I have reviewed the patient's current medications.  Allergies: No Known Allergies  Past Medical History:  Diagnosis Date  . Anxiety 2019  . Depression 2016  . Dyspnea 01/2009   with sternal area discomfort, Pt questions anxiety  . Frequent UTI   . Neutropenic fever ([place]) 04/18/2018    Past Surgical History:  Procedure Laterality Date  . INDUCED ABORTION    . LYMPH NODE BIOPSY Right 02/15/2018   Procedure: RIGHT CERVICAL LYMPH NODE EXCISIONAL BIOPSY;  Surgeon: Erroll Luna, MD;  Location: [facility];  Service: General;  Laterality: Right;  . MOUTH SURGERY    . PORTACATH PLACEMENT Left 03/07/2018   Procedure: INSERTION PORT-A-CATH WITH ULTRASOUND;  Surgeon: Erroll Luna, MD;  Location: [facility];  Service: General;  Laterality: Left;  . WRIST SURGERY      Family History  Problem Relation Age of Onset  . Cancer Paternal Grandmother        lung  . Heart disease Paternal Grandfather        died from heart attack  . Alcohol abuse Neg Hx   . Arthritis Neg Hx   . Asthma  Neg Hx   . Birth defects Neg Hx   . COPD Neg Hx   . Depression Neg Hx   . Diabetes Neg Hx   . Drug abuse Neg Hx   . Early death Neg Hx   . Hearing loss Neg Hx   . Hyperlipidemia Neg Hx   . Hypertension Neg Hx   . Kidney disease Neg Hx   . Learning disabilities Neg Hx   . Mental illness Neg Hx   . Mental retardation Neg Hx   . Miscarriages / Stillbirths Neg Hx   . Stroke Neg Hx   . Vision loss Neg Hx   . Varicose Veins Neg Hx     Social  History   Socioeconomic History  . Marital status: Married    Spouse name: Astrid Divine  . Number of children: 1  . Years of education: Not on file  . Highest education level: Not on file  Occupational History  . Occupation: Geographical information systems officer  . Financial resource strain: Not on file  . Food insecurity:    Worry: Not on file    Inability: Not on file  . Transportation needs:    Medical: Not on file    Non-medical: Not on file  Tobacco Use  . Smoking status: Former Research scientist (life sciences)  . Smokeless tobacco: Never Used  . Tobacco comment: 2015  Substance and Sexual Activity  . Alcohol use: Yes    Alcohol/week: 0.0 standard drinks    Comment: socially   . Drug use: No    Comment: Hx of drug abuse- per pt in previous ED notes  . Sexual activity: Yes    Birth control/protection: None  Lifestyle  . Physical activity:    Days per week: Not on file    Minutes per session: Not on file  . Stress: Not on file  Relationships  . Social connections:    Talks on phone: Not on file    Gets together: Not on file    Attends religious service: Not on file    Active member of club or organization: Not on file    Attends meetings of clubs or organizations: Not on file    Relationship status: Not on file  . Intimate partner violence:    Fear of current or ex partner: Not on file    Emotionally abused: Not on file    Physically abused: Not on file    Forced sexual activity: Not on file  Other Topics Concern  . Not on file  Social History Narrative  . Not on file    Past Medical History, Surgical history, Social history, and Family history were reviewed and updated as appropriate.   Please see review of systems for further details on the patient's review from today.   Review of Systems:  Review of Systems  Constitutional: Negative for chills, diaphoresis and fever.  HENT: Negative for trouble swallowing.   Respiratory: Negative for cough, choking, chest tightness and shortness of breath.   Skin:  Positive for rash.  Neurological: Negative for headaches.    Objective:   Physical Exam:  BP 113/82 (BP Location: Left Arm, Patient Position: Sitting)   Pulse 82   Temp 99.1 F (37.3 C) (Oral)   Resp 16   SpO2 98%  ECOG: 0  Physical Exam  Constitutional: She appears well-developed and well-nourished. No distress.  HENT:  Head: Normocephalic and atraumatic.  Cardiovascular: Normal rate, regular rhythm and normal heart sounds. Exam reveals no gallop.  No  murmur heard. Pulmonary/Chest: Effort normal and breath sounds normal. No stridor. No respiratory distress. She has no wheezes. She has no rales.  Skin: Skin is warm and dry. Rash noted. She is not diaphoretic. There is erythema.  There is a 5 x 3-1/2 cm area of erythema and slightly raised rash without increased warmth.  This area is lateral to the patient's port.  Immediately inferior to the patient's port is an area of macular erythema.  There is also a small area of erythema and the upper intramammary area.    Lab Review:     Component Value Date/Time   NA 141 06/08/2018 0845   K 3.8 06/08/2018 0845   CL 105 06/08/2018 0845   CO2 25 06/08/2018 0845   GLUCOSE 110 (H) 06/08/2018 0845   BUN 15 06/08/2018 0845   CREATININE 0.85 06/08/2018 0845   CREATININE 0.76 03/26/2018 1147   CALCIUM 9.6 06/08/2018 0845   PROT 7.1 06/08/2018 0845   ALBUMIN 3.8 06/08/2018 0845   AST 22 06/08/2018 0845   AST 14 (L) 03/26/2018 1147   ALT 32 06/08/2018 0845   ALT 24 03/26/2018 1147   ALKPHOS 64 06/08/2018 0845   BILITOT 0.7 06/08/2018 0845   BILITOT 1.0 03/26/2018 1147   GFRNONAA >60 06/08/2018 0845   GFRNONAA >60 03/26/2018 1147   GFRAA >60 06/08/2018 0845   GFRAA >60 03/26/2018 1147       Component Value Date/Time   WBC 5.0 06/08/2018 0845   RBC 4.28 06/08/2018 0845   HGB 12.3 06/08/2018 0845   HGB 12.2 03/26/2018 1147   HCT 37.7 06/08/2018 0845   PLT 274 06/08/2018 0845   PLT 181 03/26/2018 1147   MCV 88.1 06/08/2018 0845    MCH 28.7 06/08/2018 0845   MCHC 32.6 06/08/2018 0845   RDW 15.2 06/08/2018 0845   LYMPHSABS 1.6 06/08/2018 0845   MONOABS 0.5 06/08/2018 0845   EOSABS 0.3 06/08/2018 0845   BASOSABS 0.1 06/08/2018 0845   -------------------------------  Imaging from last 24 hours (if applicable):  Radiology interpretation: No results found.      This patient was seen with Dr. Julien Nordmann with my treatment plan reviewed with him. He expressed agreement with my medical management of this patient.  ADDENDUM: Hematology/Oncology Attending: I had a face-to-face encounter with the patient.  I recommended her care plan.  This is a very pleasant 27 years old white female with history of Hodgkin's lymphoma and currently undergoing treatment with ABVD status post 3 cycles.  She has been tolerating her treatment well with no concerning complaints.  The patient presented to the symptom management clinic today complaining of the skin rash on the left anterior side of her chest.  It is not itching.  The etiology is not clear at this point.  I recommended for the patient to consider topical over-the-counter hydrocortisone cream.  She will continue to monitor this rash closely and she will inform us of any changes or increase in the rash or no improvement with hydrocortisone. The patient will proceed with her treatment tomorrow as scheduled. She was advised to call immediately if she has any concerning symptoms in the interval. Disclaimer: This note was dictated with voice recognition software. Similar sounding words can inadvertently be transcribed and may be missed upon review. Eilleen Kempf, MD 06/13/18

## 2018-06-22 ENCOUNTER — Other Ambulatory Visit: Payer: Self-pay | Admitting: Medical

## 2018-06-22 ENCOUNTER — Inpatient Hospital Stay: Payer: 59

## 2018-06-22 ENCOUNTER — Ambulatory Visit (HOSPITAL_BASED_OUTPATIENT_CLINIC_OR_DEPARTMENT_OTHER): Payer: 59 | Admitting: Medical

## 2018-06-22 VITALS — BP 117/79 | HR 69 | Temp 98.1°F | Resp 16

## 2018-06-22 DIAGNOSIS — R21 Rash and other nonspecific skin eruption: Secondary | ICD-10-CM

## 2018-06-22 DIAGNOSIS — C8170 Other classical Hodgkin lymphoma, unspecified site: Secondary | ICD-10-CM

## 2018-06-22 DIAGNOSIS — Z5111 Encounter for antineoplastic chemotherapy: Secondary | ICD-10-CM | POA: Diagnosis not present

## 2018-06-22 DIAGNOSIS — Z3162 Encounter for fertility preservation counseling: Secondary | ICD-10-CM

## 2018-06-22 LAB — CBC WITH DIFFERENTIAL/PLATELET
Abs Immature Granulocytes: 0.02 10*3/uL (ref 0.00–0.07)
BASOS PCT: 1 %
Basophils Absolute: 0.1 10*3/uL (ref 0.0–0.1)
EOS PCT: 7 %
Eosinophils Absolute: 0.4 10*3/uL (ref 0.0–0.5)
HCT: 37 % (ref 36.0–46.0)
Hemoglobin: 12.1 g/dL (ref 12.0–15.0)
Immature Granulocytes: 0 %
Lymphocytes Relative: 26 %
Lymphs Abs: 1.4 10*3/uL (ref 0.7–4.0)
MCH: 29.2 pg (ref 26.0–34.0)
MCHC: 32.7 g/dL (ref 30.0–36.0)
MCV: 89.2 fL (ref 80.0–100.0)
MONO ABS: 0.8 10*3/uL (ref 0.1–1.0)
Monocytes Relative: 15 %
NEUTROS ABS: 2.6 10*3/uL (ref 1.7–7.7)
Neutrophils Relative %: 51 %
PLATELETS: 237 10*3/uL (ref 150–400)
RBC: 4.15 MIL/uL (ref 3.87–5.11)
RDW: 14.6 % (ref 11.5–15.5)
WBC: 5.2 10*3/uL (ref 4.0–10.5)
nRBC: 0 % (ref 0.0–0.2)

## 2018-06-22 LAB — COMPREHENSIVE METABOLIC PANEL
ALT: 20 U/L (ref 0–44)
ANION GAP: 9 (ref 5–15)
AST: 17 U/L (ref 15–41)
Albumin: 4 g/dL (ref 3.5–5.0)
Alkaline Phosphatase: 65 U/L (ref 38–126)
BUN: 16 mg/dL (ref 6–20)
CO2: 25 mmol/L (ref 22–32)
Calcium: 9.6 mg/dL (ref 8.9–10.3)
Chloride: 107 mmol/L (ref 98–111)
Creatinine, Ser: 0.71 mg/dL (ref 0.44–1.00)
GFR calc non Af Amer: >60 mL/min (ref >60)
Glucose, Bld: 94 mg/dL (ref 70–99)
Potassium: 4 mmol/L (ref 3.5–5.1)
SODIUM: 141 mmol/L (ref 135–145)
Total Bilirubin: 0.5 mg/dL (ref 0.3–1.2)
Total Protein: 7.1 g/dL (ref 6.5–8.1)

## 2018-06-22 LAB — PREGNANCY, URINE: Preg Test, Ur: NEGATIVE

## 2018-06-22 MED ORDER — DOXORUBICIN HCL CHEMO IV INJECTION 2 MG/ML
25.0000 mg/m2 | Freq: Once | INTRAVENOUS | Status: AC
  Administered 2018-06-22: 50 mg via INTRAVENOUS
  Filled 2018-06-22: qty 25

## 2018-06-22 MED ORDER — SODIUM CHLORIDE 0.9% FLUSH
10.0000 mL | INTRAVENOUS | Status: DC | PRN
  Administered 2018-06-22: 10 mL
  Filled 2018-06-22: qty 10

## 2018-06-22 MED ORDER — SODIUM CHLORIDE 0.9 % IV SOLN
375.0000 mg/m2 | Freq: Once | INTRAVENOUS | Status: AC
  Administered 2018-06-22: 750 mg via INTRAVENOUS
  Filled 2018-06-22: qty 75

## 2018-06-22 MED ORDER — SODIUM CHLORIDE 0.9 % IV SOLN
Freq: Once | INTRAVENOUS | Status: AC
  Administered 2018-06-22: 10:00:00 via INTRAVENOUS
  Filled 2018-06-22: qty 5

## 2018-06-22 MED ORDER — LORAZEPAM 1 MG PO TABS
ORAL_TABLET | ORAL | Status: AC
  Filled 2018-06-22: qty 1

## 2018-06-22 MED ORDER — SODIUM CHLORIDE 0.9% FLUSH
10.0000 mL | Freq: Once | INTRAVENOUS | Status: AC
  Administered 2018-06-22: 10 mL
  Filled 2018-06-22: qty 10

## 2018-06-22 MED ORDER — PALONOSETRON HCL INJECTION 0.25 MG/5ML
INTRAVENOUS | Status: AC
  Filled 2018-06-22: qty 5

## 2018-06-22 MED ORDER — SODIUM CHLORIDE 0.9 % IV SOLN
Freq: Once | INTRAVENOUS | Status: AC
  Administered 2018-06-22: 10:00:00 via INTRAVENOUS
  Filled 2018-06-22: qty 250

## 2018-06-22 MED ORDER — VINBLASTINE SULFATE CHEMO INJECTION 1 MG/ML
3.0000 mg/m2 | Freq: Once | INTRAVENOUS | Status: AC
  Administered 2018-06-22: 6 mg via INTRAVENOUS
  Filled 2018-06-22: qty 6

## 2018-06-22 MED ORDER — HEPARIN SOD (PORK) LOCK FLUSH 100 UNIT/ML IV SOLN
500.0000 [IU] | Freq: Once | INTRAVENOUS | Status: AC | PRN
  Administered 2018-06-22: 500 [IU]
  Filled 2018-06-22: qty 5

## 2018-06-22 MED ORDER — LORAZEPAM 1 MG PO TABS
1.0000 mg | ORAL_TABLET | Freq: Once | ORAL | Status: AC
  Administered 2018-06-22: 1 mg via ORAL

## 2018-06-22 MED ORDER — PALONOSETRON HCL INJECTION 0.25 MG/5ML
0.2500 mg | Freq: Once | INTRAVENOUS | Status: AC
  Administered 2018-06-22: 0.25 mg via INTRAVENOUS

## 2018-06-22 MED ORDER — PREDNISONE 5 MG PO TABS: ORAL_TABLET | ORAL | 0 refills | Status: DC

## 2018-06-22 NOTE — Patient Instructions (Signed)
[facility] Discharge Instructions for Patients Receiving Chemotherapy  Today you received the following chemotherapy agents:  doxorubicin (Adriamycin), vinblastine (Velban), and dacarbazine (DTIC).  To help prevent nausea and vomiting after your treatment, we encourage you to take your nausea medication as directed.   If you develop nausea and vomiting that is not controlled by your nausea medication, call the clinic.   BELOW ARE SYMPTOMS THAT SHOULD BE REPORTED IMMEDIATELY:  *FEVER GREATER THAN 100.5 F  *CHILLS WITH OR WITHOUT FEVER  NAUSEA AND VOMITING THAT IS NOT CONTROLLED WITH YOUR NAUSEA MEDICATION  *UNUSUAL SHORTNESS OF BREATH  *UNUSUAL BRUISING OR BLEEDING  TENDERNESS IN MOUTH AND THROAT WITH OR WITHOUT PRESENCE OF ULCERS  *URINARY PROBLEMS  *BOWEL PROBLEMS  UNUSUAL RASH Items with * indicate a potential emergency and should be followed up as soon as possible.  Feel free to call the clinic should you have any questions or concerns. The clinic phone number is [phone].  Please show the [facility] at check-in to the Emergency Department and triage nurse.

## 2018-06-27 NOTE — Progress Notes (Signed)
The patient patient continues to have a rash surrounding her left chest wall port-a-cath, left upper arm, and between her breasts despite using triamcinolone cream. She reports that these areas are raised and itching. She was given a prednisone taper. She knows to return or call if this rash continues or worsens.   [name], MHS, PA-C Physician Assistant

## 2018-07-06 ENCOUNTER — Encounter: Payer: Self-pay | Admitting: Oncology

## 2018-07-06 ENCOUNTER — Inpatient Hospital Stay (HOSPITAL_BASED_OUTPATIENT_CLINIC_OR_DEPARTMENT_OTHER): Payer: 59 | Admitting: Oncology

## 2018-07-06 ENCOUNTER — Inpatient Hospital Stay: Payer: 59

## 2018-07-06 ENCOUNTER — Telehealth: Payer: Self-pay | Admitting: Oncology

## 2018-07-06 VITALS — BP 102/77 | HR 78 | Temp 97.8°F | Resp 18 | Ht 69.0 in | Wt 215.1 lb

## 2018-07-06 DIAGNOSIS — Z79899 Other long term (current) drug therapy: Secondary | ICD-10-CM

## 2018-07-06 DIAGNOSIS — C8170 Other classical Hodgkin lymphoma, unspecified site: Secondary | ICD-10-CM

## 2018-07-06 DIAGNOSIS — G620 Drug-induced polyneuropathy: Secondary | ICD-10-CM | POA: Diagnosis not present

## 2018-07-06 DIAGNOSIS — Z5111 Encounter for antineoplastic chemotherapy: Secondary | ICD-10-CM | POA: Diagnosis not present

## 2018-07-06 DIAGNOSIS — Z3162 Encounter for fertility preservation counseling: Secondary | ICD-10-CM

## 2018-07-06 DIAGNOSIS — R232 Flushing: Secondary | ICD-10-CM | POA: Diagnosis not present

## 2018-07-06 DIAGNOSIS — T451X5A Adverse effect of antineoplastic and immunosuppressive drugs, initial encounter: Principal | ICD-10-CM

## 2018-07-06 DIAGNOSIS — C8171 Other classical Hodgkin lymphoma, lymph nodes of head, face, and neck: Secondary | ICD-10-CM

## 2018-07-06 DIAGNOSIS — F418 Other specified anxiety disorders: Secondary | ICD-10-CM

## 2018-07-06 DIAGNOSIS — F419 Anxiety disorder, unspecified: Secondary | ICD-10-CM

## 2018-07-06 LAB — COMPREHENSIVE METABOLIC PANEL
ALK PHOS: 68 U/L (ref 38–126)
ALT: 27 U/L (ref 0–44)
AST: 21 U/L (ref 15–41)
Albumin: 4 g/dL (ref 3.5–5.0)
Anion gap: 8 (ref 5–15)
BUN: 17 mg/dL (ref 6–20)
CALCIUM: 9.4 mg/dL (ref 8.9–10.3)
CO2: 25 mmol/L (ref 22–32)
Chloride: 107 mmol/L (ref 98–111)
Creatinine, Ser: 0.72 mg/dL (ref 0.44–1.00)
GFR calc Af Amer: >60 mL/min (ref >60)
GFR calc non Af Amer: >60 mL/min (ref >60)
GLUCOSE: 87 mg/dL (ref 70–99)
Potassium: 3.8 mmol/L (ref 3.5–5.1)
SODIUM: 140 mmol/L (ref 135–145)
TOTAL PROTEIN: 6.9 g/dL (ref 6.5–8.1)
Total Bilirubin: 0.4 mg/dL (ref 0.3–1.2)

## 2018-07-06 LAB — CBC WITH DIFFERENTIAL/PLATELET
Abs Immature Granulocytes: 0.03 10*3/uL (ref 0.00–0.07)
BASOS PCT: 1 %
Basophils Absolute: 0.1 10*3/uL (ref 0.0–0.1)
EOS ABS: 0.4 10*3/uL (ref 0.0–0.5)
Eosinophils Relative: 8 %
HCT: 34 % — ABNORMAL LOW (ref 36.0–46.0)
HEMOGLOBIN: 11.5 g/dL — AB (ref 12.0–15.0)
Immature Granulocytes: 1 %
Lymphocytes Relative: 29 %
Lymphs Abs: 1.6 10*3/uL (ref 0.7–4.0)
MCH: 29.7 pg (ref 26.0–34.0)
MCHC: 33.8 g/dL (ref 30.0–36.0)
MCV: 87.9 fL (ref 80.0–100.0)
MONO ABS: 0.9 10*3/uL (ref 0.1–1.0)
Monocytes Relative: 17 %
Neutro Abs: 2.5 10*3/uL (ref 1.7–7.7)
Neutrophils Relative %: 44 %
Platelets: 270 10*3/uL (ref 150–400)
RBC: 3.87 MIL/uL (ref 3.87–5.11)
RDW: 14 % (ref 11.5–15.5)
WBC: 5.5 10*3/uL (ref 4.0–10.5)
nRBC: 0 % (ref 0.0–0.2)

## 2018-07-06 LAB — PREGNANCY, URINE: Preg Test, Ur: NEGATIVE

## 2018-07-06 MED ORDER — PALONOSETRON HCL INJECTION 0.25 MG/5ML
INTRAVENOUS | Status: AC
  Filled 2018-07-06: qty 5

## 2018-07-06 MED ORDER — SODIUM CHLORIDE 0.9 % IV SOLN
Freq: Once | INTRAVENOUS | Status: AC
  Administered 2018-07-06: 13:00:00 via INTRAVENOUS
  Filled 2018-07-06: qty 5

## 2018-07-06 MED ORDER — SODIUM CHLORIDE 0.9 % IV SOLN
Freq: Once | INTRAVENOUS | Status: AC
  Administered 2018-07-06: 12:00:00 via INTRAVENOUS
  Filled 2018-07-06: qty 250

## 2018-07-06 MED ORDER — VINBLASTINE SULFATE CHEMO INJECTION 1 MG/ML
3.0000 mg/m2 | Freq: Once | INTRAVENOUS | Status: AC
  Administered 2018-07-06: 6 mg via INTRAVENOUS
  Filled 2018-07-06: qty 6

## 2018-07-06 MED ORDER — FLUOXETINE HCL 20 MG PO CAPS: 20.0000 mg | ORAL_CAPSULE | Freq: Every day | ORAL | 2 refills | Status: DC

## 2018-07-06 MED ORDER — SODIUM CHLORIDE 0.9 % IV SOLN
375.0000 mg/m2 | Freq: Once | INTRAVENOUS | Status: AC
  Administered 2018-07-06: 750 mg via INTRAVENOUS
  Filled 2018-07-06: qty 75

## 2018-07-06 MED ORDER — LORAZEPAM 2 MG/ML IJ SOLN
INTRAMUSCULAR | Status: AC
  Filled 2018-07-06: qty 1

## 2018-07-06 MED ORDER — PALONOSETRON HCL INJECTION 0.25 MG/5ML
0.2500 mg | Freq: Once | INTRAVENOUS | Status: AC
  Administered 2018-07-06: 0.25 mg via INTRAVENOUS

## 2018-07-06 MED ORDER — SODIUM CHLORIDE 0.9% FLUSH
10.0000 mL | INTRAVENOUS | Status: DC | PRN
  Administered 2018-07-06: 10 mL
  Filled 2018-07-06: qty 10

## 2018-07-06 MED ORDER — DOXORUBICIN HCL CHEMO IV INJECTION 2 MG/ML
25.0000 mg/m2 | Freq: Once | INTRAVENOUS | Status: AC
  Administered 2018-07-06: 50 mg via INTRAVENOUS
  Filled 2018-07-06: qty 25

## 2018-07-06 MED ORDER — HEPARIN SOD (PORK) LOCK FLUSH 100 UNIT/ML IV SOLN
500.0000 [IU] | Freq: Once | INTRAVENOUS | Status: AC | PRN
  Administered 2018-07-06: 500 [IU]
  Filled 2018-07-06: qty 5

## 2018-07-06 MED ORDER — LORAZEPAM 2 MG/ML IJ SOLN
1.0000 mg | Freq: Once | INTRAMUSCULAR | Status: AC
  Administered 2018-07-06: 1 mg via INTRAVENOUS

## 2018-07-06 MED ORDER — LEUPROLIDE ACETATE 7.5 MG IM KIT
7.5000 mg | PACK | Freq: Once | INTRAMUSCULAR | Status: AC
  Administered 2018-07-06: 7.5 mg via INTRAMUSCULAR
  Filled 2018-07-06: qty 7.5

## 2018-07-06 MED ORDER — SODIUM CHLORIDE 0.9% FLUSH
10.0000 mL | Freq: Once | INTRAVENOUS | Status: AC
  Administered 2018-07-06: 10 mL
  Filled 2018-07-06: qty 10

## 2018-07-06 NOTE — Progress Notes (Signed)
[place] OFFICE PROGRESS NOTE  [name], MD [place] [address]    Classical Hodgkin lymphoma [facility])   01/22/2018 Imaging    CT scan of neck: Adenopathy throughout the right lateral neck, right supraclavicular fossa, and upper mediastinum. There is no specific imaging feature, lymphoma or atypical infection are the primary considerations and excisional biopsy should be considered if there is no diagnosis by history or labs.     01/31/2018 Pathology Results    Lymph node for lymphoma, right supraclavicular / right cervical - ATYPICAL LYMPHOID PROLIFERATION. - SEE COMMENT. Microscopic Comment Sections show a few very small needle core biopsy fragments of lymph nodal tissue displaying a polymorphous cellular proliferation of small lymphocytes, plasma cells, eosinophils and histiocytes in addition to variable numbers of large mononuclear and multilobated lymphoid appearing cells with variably prominent nucleoli. Flow cytometric was performed ([id number]) and failed to show any monoclonal B-cell population or abnormal T-cell phenotype. Immunohistochemical stains were performed, including CD15, CD20, CD3, CD30, LCA, and PAX-5 with appropriate controls. The large atypical lymphoid appearing cells are positive for CD15, CD30, CD20 and PAX-5 and negative for CD3 and LCA. The small lymphoid component in the background is mostly composed of T cells. The morphologic and phenotypic features are atypical and highly suspicious for involvement by a lymphoproliferative process, particularly classical Hodgkin lymphoma. Excisional biopsy is recommended.    01/31/2018 Procedure    Ultrasound-guided core biopsies of right cervical lymph nodes.    02/15/2018 Pathology Results    Lymph node for lymphoma, Right Cervical - CLASSICAL HODGKIN LYMPHOMA, NODULAR SCLEROSIS TYPE    03/05/2018 PET scan    1. Hypermetabolic RIGHT level 2 lymph nodes, supraclavicular lymph  nodes, sub pectoralis lymph nodes and RIGHT axillary lymph nodes. 2. Hypermetabolic mediastinal lymph nodes. 3. No evidence of lymphoma beneath the diaphragms. Normal spleen. 4. Normal bone marrow.    03/05/2018 Imaging    LV EF: 55% -  60%    03/06/2018 Cancer Staging    Staging form: Hodgkin and Non-Hodgkin Lymphoma, AJCC 8th Edition - Clinical stage from 03/06/2018: Stage II (Hodgkin lymphoma, B - Symptoms) - Signed by Heath Lark, MD on 03/06/2018    03/08/2018 -  Chemotherapy    The patient had ABVD for chemotherapy treatment. After interim scan showed complete response, Bleomycin was discontinued     05/01/2018 PET scan    1. Near complete resolution of metabolic activity within RIGHT cervical lymph nodes, mediastinal nodes, RIGHT supraclavicular nodes, and axillary nodes ( [place] 1 and 2). 2. No evidence lymphoma progression. 3. Normal spleen and marrow. 4. Extensive hypermetabolic brown fat within the neck and thorax makes evaluation challenging.    06/05/2018 Echocardiogram    LV EF: 60% -  65%    CURRENT THERAPY: Day 1 of cycle 5 of ABVD.  Bleomycin has been discontinued and vinblastine has been dose reduced by 50% secondary to peripheral neuropathy.  INTERVAL HISTORY: Adrienne [name] 27 y.o. female returns for routine follow-up visit accompanied by her cousin.  The patient is feeling fine today and has no specific complaints except for feeling more depressed and anxious.  She states that she has previously been on Prozac in the past which was effective for her.  She has tried other antidepressants like Lexapro and Zoloft and reports that she did not tolerate these.  She denies fevers and chills.  Denies chest pain, shortness of breath, cough, hemoptysis.  Reports intermittent nausea particularly when she  comes to the cancer center.  Reports that Ativan and her IV is helpful.  Denies vomiting, constipation, diarrhea.  Denies recent weight loss or night sweats.  Has intermittent  hot flashes which are tolerable.  She is here for evaluation prior to day 1 of cycle #5 of her chemotherapy.  MEDICAL HISTORY: Past Medical History:  Diagnosis Date  . Anxiety 2019  . Depression 2016  . Dyspnea 01/2009   with sternal area discomfort, Pt questions anxiety  . Frequent UTI   . Neutropenic fever ([place]) 04/18/2018    ALLERGIES:  has No Known Allergies.  MEDICATIONS:  Current Outpatient Medications  Medication Sig Dispense Refill  . lidocaine-prilocaine (EMLA) cream Apply 1 application topically as needed. 30 g 6  . LORazepam (ATIVAN) 1 MG tablet Take 1 tablet (1 mg total) by mouth every 8 (eight) hours as needed for sleep (nausea). 60 tablet 0  . dexamethasone (DECADRON) 4 MG tablet Take 1 tablet (4 mg total) by mouth 2 (two) times daily with a meal. (Patient not taking: Reported on 06/08/2018) 60 tablet 0  . FLUoxetine (PROZAC) 20 MG capsule Take 1 capsule (20 mg total) by mouth daily. 30 capsule 2  . ibuprofen (ADVIL,MOTRIN) 800 MG tablet Take 1 tablet (800 mg total) by mouth every 8 (eight) hours as needed. (Patient not taking: Reported on 06/08/2018) 30 tablet 0  . levonorgestrel (MIRENA, 52 MG,) 20 MCG/24HR IUD Mirena 20 mcg/24 hours (5 yrs) 52 mg intrauterine device  Take 1 device by intrauterine route.    . magic mouthwash w/lidocaine SOLN Take 5 mLs by mouth 4 (four) times daily as needed for mouth pain. (Patient not taking: Reported on 06/08/2018) 160 mL 1  . ondansetron (ZOFRAN) 8 MG tablet Take 1 tablet (8 mg total) by mouth every 8 (eight) hours as needed. Start on the third day after chemotherapy. (Patient not taking: Reported on 06/08/2018) 30 tablet 1  . predniSONE (DELTASONE) 5 MG tablet 6 tab x 2 days 5 tab x 2 days, 4 tab x 2 days, 3 tab x 2 days, 2 tab x 2 days, 1 tab x 2 days, stop (Patient not taking: Reported on 07/06/2018) 42 tablet 0  . prochlorperazine (COMPAZINE) 10 MG tablet Take 1 tablet (10 mg total) by mouth every 6 (six) hours as needed (Nausea or  vomiting). (Patient not taking: Reported on 06/08/2018) 30 tablet 1  . traMADol (ULTRAM) 50 MG tablet Take 1 tablet (50 mg total) by mouth every 6 (six) hours as needed. (Patient not taking: Reported on 06/08/2018) 30 tablet 0  . Tretinoin Microsphere (RETIN-A MICRO PUMP) 0.08 % GEL Retin-A Micro Pump 0.08 % topical gel  APPLY AA QHS    . triamcinolone (KENALOG) 0.025 % cream Apply 1 application topically 2 (two) times daily. (Patient not taking: Reported on 06/08/2018) 30 g 1  . triamcinolone (NASACORT) 55 MCG/ACT AERO nasal inhaler Place 2 sprays into the nose daily. (Patient not taking: Reported on 06/08/2018) 1 Inhaler 12   No current facility-administered medications for this visit.    Facility-Administered Medications Ordered in Other Visits  Medication Dose Route Frequency Provider Last Rate Last Dose  . dacarbazine (DTIC) 750 mg in sodium chloride 0.9 % 250 mL chemo infusion  375 mg/m2 (Treatment Plan Recorded) Intravenous Once Alvy Bimler, Ni, MD      . DOXOrubicin (ADRIAMYCIN) chemo injection 50 mg  25 mg/m2 (Treatment Plan Recorded) Intravenous Once Gorsuch, Ni, MD      . fosaprepitant (EMEND) 150 mg, dexamethasone (DECADRON)  6 mg in sodium chloride 0.9 % 145 mL IVPB   Intravenous Once Alvy Bimler, Ni, MD      . heparin lock flush 100 unit/mL  500 Units Intracatheter Once PRN Alvy Bimler, Ni, MD      . leuprolide (LUPRON) injection 7.5 mg  7.5 mg Intramuscular Once Alvy Bimler, Ni, MD      . sodium chloride flush (NS) 0.9 % injection 10 mL  10 mL Intracatheter PRN Gorsuch, Ni, MD      . vinBLAStine (VELBAN) 6 mg in sodium chloride 0.9 % 50 mL chemo infusion  3 mg/m2 (Treatment Plan Recorded) Intravenous Once Heath Lark, MD        SURGICAL HISTORY:  Past Surgical History:  Procedure Laterality Date  . INDUCED ABORTION    . LYMPH NODE BIOPSY Right 02/15/2018   Procedure: RIGHT CERVICAL LYMPH NODE EXCISIONAL BIOPSY;  Surgeon: Erroll Luna, MD;  Location: [facility];  Service: General;   Laterality: Right;  . MOUTH SURGERY    . PORTACATH PLACEMENT Left 03/07/2018   Procedure: INSERTION PORT-A-CATH WITH ULTRASOUND;  Surgeon: Erroll Luna, MD;  Location: [facility];  Service: General;  Laterality: Left;  . WRIST SURGERY      REVIEW OF SYSTEMS:   Review of Systems  Constitutional: Negative for appetite change, chills, fatigue, fever and unexpected weight change.  HENT:   Negative for mouth sores, nosebleeds, sore throat and trouble swallowing.   Eyes: Negative for eye problems and icterus.  Respiratory: Negative for cough, hemoptysis, shortness of breath and wheezing.   Cardiovascular: Negative for chest pain and leg swelling.  Gastrointestinal: Negative for abdominal pain, constipation, diarrhea, and vomiting.  Positive for nausea. Genitourinary: Negative for bladder incontinence, difficulty urinating, dysuria, frequency and hematuria.   Musculoskeletal: Negative for back pain, gait problem, neck pain and neck stiffness.  Skin: Negative for itching and rash.  Neurological: Negative for dizziness, extremity weakness, gait problem, headaches, light-headedness and seizures.  Hematological: Negative for adenopathy. Does not bruise/bleed easily.  Psychiatric/Behavioral: Negative for confusion and sleep disturbance.  Positive for depression and anxiety.   PHYSICAL EXAMINATION:  Blood pressure 102/77, pulse 78, temperature 97.8 F (36.6 C), temperature source Oral, resp. rate 18, height 5\' 9"  (1.753 m), weight 215 lb 1.6 oz (97.6 kg), SpO2 99 %.  ECOG PERFORMANCE STATUS: 1 - Symptomatic but completely ambulatory  Physical Exam  Constitutional: Oriented to person, place, and time and well-developed, well-nourished, and in no distress. No distress.  HENT:  Head: Normocephalic and atraumatic.  Mouth/Throat: Oropharynx is clear and moist. No oropharyngeal exudate.  Eyes: Conjunctivae are normal. Right eye exhibits no discharge. Left eye exhibits no discharge. No  scleral icterus.  Neck: Normal range of motion. Neck supple.  Cardiovascular: Normal rate, regular rhythm, normal heart sounds and intact distal pulses.   Pulmonary/Chest: Effort normal and breath sounds normal. No respiratory distress. No wheezes. No rales.  Abdominal: Soft. Bowel sounds are normal. Exhibits no distension and no mass. There is no tenderness.  Musculoskeletal: Normal range of motion. Exhibits no edema.  Lymphadenopathy:    No cervical adenopathy.  Neurological: Alert and oriented to person, place, and time. Exhibits normal muscle tone. Gait normal. Coordination normal.  Skin: Skin is warm and dry. No rash noted. Not diaphoretic. No erythema. No pallor.  Psychiatric: Mood, memory and judgment normal.  Vitals reviewed.  LABORATORY DATA: Lab Results  Component Value Date   WBC 5.5 07/06/2018   HGB 11.5 (L) 07/06/2018   HCT 34.0 (L)  07/06/2018   MCV 87.9 07/06/2018   PLT 270 07/06/2018      Chemistry      Component Value Date/Time   NA 140 07/06/2018 1046   K 3.8 07/06/2018 1046   CL 107 07/06/2018 1046   CO2 25 07/06/2018 1046   BUN 17 07/06/2018 1046   CREATININE 0.72 07/06/2018 1046   CREATININE 0.76 03/26/2018 1147      Component Value Date/Time   CALCIUM 9.4 07/06/2018 1046   ALKPHOS 68 07/06/2018 1046   AST 21 07/06/2018 1046   AST 14 (L) 03/26/2018 1147   ALT 27 07/06/2018 1046   ALT 24 03/26/2018 1147   BILITOT 0.4 07/06/2018 1046   BILITOT 1.0 03/26/2018 1147       RADIOGRAPHIC STUDIES:  No results found.   ASSESSMENT/PLAN:  Classical Hodgkin lymphoma ([place]) This is a pleasant 27 year old female with Hodgkin lymphoma.  She is currently receiving chemotherapy with ABVD.  Bleomycin has been discontinued and vinblastine has been dose reduced by 50% secondary to peripheral neuropathy.  She has been tolerating her chemotherapy well overall with the exception of mild nausea.  Labs from today been reviewed.  Recommend for her to proceed with day 1  of cycle #5 today as scheduled.  She will follow-up in 2 weeks for lab and chemotherapy and have a follow-up visit in 4 weeks for evaluation prior to starting cycle #6.  Peripheral neuropathy due to chemotherapy [facility]) Reports the peripheral neuropathy is intermittent.  Does not wish to take medication for this.  Vinblastine dose has been reduced by 50%.  Continue to monitor.  Hot flashes Hot flashes are ongoing.  Due to recent hormonal suppressive therapy.  We have discussed treating her with medication for hot flashes but she does not want to proceed with this.  Continue to monitor.  Anxiety Patient reports ongoing anxiety which worsens when she comes to the cancer center for her chemotherapy.  The patient will receive Ativan as a premedication.  This has been added to her treatment plan.  She also reports increased depression along with her anxiety.  She has been on Prozac in the past.  She was given a prescription for Prozac 20 mg daily.  Adverse effect of this medication were discussed with the patient and she is agreeable to take it.  I have offered her a referral to social work for counseling but she has declined this.  She reports that she is undergoing counseling to her church.  She will let us know if her anxiety and depression worsen.   No orders of the defined types were placed in this encounter.    Mikey Bussing, DNP, AGPCNP-BC, AOCNP 07/06/18

## 2018-07-06 NOTE — Assessment & Plan Note (Signed)
Patient reports ongoing anxiety which worsens when she comes to the cancer center for her chemotherapy.  The patient will receive Ativan as a premedication.  This has been added to her treatment plan.  She also reports increased depression along with her anxiety.  She has been on Prozac in the past.  She was given a prescription for Prozac 20 mg daily.  Adverse effect of this medication were discussed with the patient and she is agreeable to take it.  I have offered her a referral to social work for counseling but she has declined this.  She reports that she is undergoing counseling to her church.  She will let us know if her anxiety and depression worsen.

## 2018-07-06 NOTE — Patient Instructions (Signed)
[facility] Discharge Instructions for Patients Receiving Chemotherapy  Today you received the following chemotherapy agents Adriamycin, Vinblastine, and DTIC  To help prevent nausea and vomiting after your treatment, we encourage you to take your nausea medication as directed   If you develop nausea and vomiting that is not controlled by your nausea medication, call the clinic.   BELOW ARE SYMPTOMS THAT SHOULD BE REPORTED IMMEDIATELY:  *FEVER GREATER THAN 100.5 F  *CHILLS WITH OR WITHOUT FEVER  NAUSEA AND VOMITING THAT IS NOT CONTROLLED WITH YOUR NAUSEA MEDICATION  *UNUSUAL SHORTNESS OF BREATH  *UNUSUAL BRUISING OR BLEEDING  TENDERNESS IN MOUTH AND THROAT WITH OR WITHOUT PRESENCE OF ULCERS  *URINARY PROBLEMS  *BOWEL PROBLEMS  UNUSUAL RASH Items with * indicate a potential emergency and should be followed up as soon as possible.  Feel free to call the clinic should you have any questions or concerns. The clinic phone number is [phone].  Please show the CHEMO ALERT CARD at check-in to the Emergency Department and triage nurse.   

## 2018-07-06 NOTE — Assessment & Plan Note (Signed)
Reports the peripheral neuropathy is intermittent.  Does not wish to take medication for this.  Vinblastine dose has been reduced by 50%.  Continue to monitor.

## 2018-07-06 NOTE — Assessment & Plan Note (Signed)
This is a pleasant 27 year old female with Hodgkin lymphoma.  She is currently receiving chemotherapy with ABVD.  Bleomycin has been discontinued and vinblastine has been dose reduced by 50% secondary to peripheral neuropathy.  She has been tolerating her chemotherapy well overall with the exception of mild nausea.  Labs from today been reviewed.  Recommend for her to proceed with day 1 of cycle #5 today as scheduled.  She will follow-up in 2 weeks for lab and chemotherapy and have a follow-up visit in 4 weeks for evaluation prior to starting cycle #6.

## 2018-07-06 NOTE — Assessment & Plan Note (Signed)
Hot flashes are ongoing.  Due to recent hormonal suppressive therapy.  We have discussed treating her with medication for hot flashes but she does not want to proceed with this.  Continue to monitor.

## 2018-07-06 NOTE — Telephone Encounter (Signed)
Printed calendar and avs. °

## 2018-07-06 NOTE — Patient Instructions (Signed)

## 2018-07-13 ENCOUNTER — Telehealth: Payer: Self-pay

## 2018-07-13 NOTE — Telephone Encounter (Signed)
Called to clarify medical leave return to work date. She states to leave it at 09/27/18.

## 2018-07-17 NOTE — Progress Notes (Signed)
FMLA successfully faxed to [employer] attn: Trevor Iha at [phone]. Mailed copy to patient address on file.

## 2018-07-19 ENCOUNTER — Telehealth: Payer: Self-pay

## 2018-07-20 ENCOUNTER — Inpatient Hospital Stay: Payer: 59

## 2018-07-20 ENCOUNTER — Inpatient Hospital Stay: Payer: 59 | Attending: Hematology and Oncology

## 2018-07-20 ENCOUNTER — Other Ambulatory Visit: Payer: Self-pay | Admitting: Hematology and Oncology

## 2018-07-20 VITALS — BP 113/75 | HR 74 | Temp 98.9°F | Resp 16

## 2018-07-20 DIAGNOSIS — Z5111 Encounter for antineoplastic chemotherapy: Secondary | ICD-10-CM | POA: Diagnosis not present

## 2018-07-20 DIAGNOSIS — F329 Major depressive disorder, single episode, unspecified: Secondary | ICD-10-CM | POA: Diagnosis not present

## 2018-07-20 DIAGNOSIS — C8171 Other classical Hodgkin lymphoma, lymph nodes of head, face, and neck: Secondary | ICD-10-CM | POA: Insufficient documentation

## 2018-07-20 DIAGNOSIS — C8170 Other classical Hodgkin lymphoma, unspecified site: Secondary | ICD-10-CM

## 2018-07-20 DIAGNOSIS — R232 Flushing: Secondary | ICD-10-CM | POA: Insufficient documentation

## 2018-07-20 DIAGNOSIS — R110 Nausea: Secondary | ICD-10-CM | POA: Diagnosis not present

## 2018-07-20 DIAGNOSIS — F419 Anxiety disorder, unspecified: Secondary | ICD-10-CM | POA: Insufficient documentation

## 2018-07-20 DIAGNOSIS — Z3162 Encounter for fertility preservation counseling: Secondary | ICD-10-CM

## 2018-07-20 LAB — CBC WITH DIFFERENTIAL/PLATELET
Abs Immature Granulocytes: 0.01 10*3/uL (ref 0.00–0.07)
Basophils Absolute: 0.1 10*3/uL (ref 0.0–0.1)
Basophils Relative: 2 %
EOS PCT: 6 %
Eosinophils Absolute: 0.3 10*3/uL (ref 0.0–0.5)
HCT: 35.4 % — ABNORMAL LOW (ref 36.0–46.0)
Hemoglobin: 11.9 g/dL — ABNORMAL LOW (ref 12.0–15.0)
Immature Granulocytes: 0 %
Lymphocytes Relative: 27 %
Lymphs Abs: 1.3 10*3/uL (ref 0.7–4.0)
MCH: 29.9 pg (ref 26.0–34.0)
MCHC: 33.6 g/dL (ref 30.0–36.0)
MCV: 88.9 fL (ref 80.0–100.0)
Monocytes Absolute: 0.7 10*3/uL (ref 0.1–1.0)
Monocytes Relative: 15 %
Neutro Abs: 2.4 10*3/uL (ref 1.7–7.7)
Neutrophils Relative %: 50 %
Platelets: 268 10*3/uL (ref 150–400)
RBC: 3.98 MIL/uL (ref 3.87–5.11)
RDW: 14 % (ref 11.5–15.5)
WBC: 4.6 10*3/uL (ref 4.0–10.5)
nRBC: 0 % (ref 0.0–0.2)

## 2018-07-20 LAB — COMPREHENSIVE METABOLIC PANEL
ALT: 22 U/L (ref 0–44)
AST: 19 U/L (ref 15–41)
Albumin: 3.9 g/dL (ref 3.5–5.0)
Alkaline Phosphatase: 64 U/L (ref 38–126)
Anion gap: 10 (ref 5–15)
BUN: 15 mg/dL (ref 6–20)
CO2: 23 mmol/L (ref 22–32)
Calcium: 9.2 mg/dL (ref 8.9–10.3)
Chloride: 108 mmol/L (ref 98–111)
Creatinine, Ser: 0.8 mg/dL (ref 0.44–1.00)
GFR calc Af Amer: >60 mL/min (ref >60)
GFR calc non Af Amer: >60 mL/min (ref >60)
Glucose, Bld: 83 mg/dL (ref 70–99)
Potassium: 4.1 mmol/L (ref 3.5–5.1)
Sodium: 141 mmol/L (ref 135–145)
Total Bilirubin: 0.4 mg/dL (ref 0.3–1.2)
Total Protein: 6.9 g/dL (ref 6.5–8.1)

## 2018-07-20 LAB — PREGNANCY, URINE: PREG TEST UR: NEGATIVE

## 2018-07-20 MED ORDER — DOXORUBICIN HCL CHEMO IV INJECTION 2 MG/ML
25.0000 mg/m2 | Freq: Once | INTRAVENOUS | Status: AC
  Administered 2018-07-20: 50 mg via INTRAVENOUS
  Filled 2018-07-20: qty 25

## 2018-07-20 MED ORDER — LORAZEPAM 1 MG PO TABS: 1.0000 mg | ORAL_TABLET | Freq: Three times a day (TID) | ORAL | 0 refills | Status: DC | PRN

## 2018-07-20 MED ORDER — HEPARIN SOD (PORK) LOCK FLUSH 100 UNIT/ML IV SOLN
500.0000 [IU] | Freq: Once | INTRAVENOUS | Status: AC | PRN
  Administered 2018-07-20: 500 [IU]
  Filled 2018-07-20: qty 5

## 2018-07-20 MED ORDER — SODIUM CHLORIDE 0.9 % IV SOLN
375.0000 mg/m2 | Freq: Once | INTRAVENOUS | Status: AC
  Administered 2018-07-20: 750 mg via INTRAVENOUS
  Filled 2018-07-20: qty 75

## 2018-07-20 MED ORDER — VINBLASTINE SULFATE CHEMO INJECTION 1 MG/ML
3.0000 mg/m2 | Freq: Once | INTRAVENOUS | Status: AC
  Administered 2018-07-20: 6 mg via INTRAVENOUS
  Filled 2018-07-20: qty 6

## 2018-07-20 MED ORDER — PALONOSETRON HCL INJECTION 0.25 MG/5ML
0.2500 mg | Freq: Once | INTRAVENOUS | Status: AC
  Administered 2018-07-20: 0.25 mg via INTRAVENOUS

## 2018-07-20 MED ORDER — SODIUM CHLORIDE 0.9 % IV SOLN
Freq: Once | INTRAVENOUS | Status: AC
  Administered 2018-07-20: 13:00:00 via INTRAVENOUS
  Filled 2018-07-20: qty 250

## 2018-07-20 MED ORDER — TRAMADOL HCL 50 MG PO TABS: 50.0000 mg | ORAL_TABLET | Freq: Four times a day (QID) | ORAL | 0 refills | Status: DC | PRN

## 2018-07-20 MED ORDER — LORAZEPAM 2 MG/ML IJ SOLN
1.0000 mg | Freq: Once | INTRAMUSCULAR | Status: AC
  Administered 2018-07-20: 1 mg via INTRAVENOUS

## 2018-07-20 MED ORDER — SODIUM CHLORIDE 0.9% FLUSH
10.0000 mL | Freq: Once | INTRAVENOUS | Status: AC
  Administered 2018-07-20: 10 mL
  Filled 2018-07-20: qty 10

## 2018-07-20 MED ORDER — SODIUM CHLORIDE 0.9 % IV SOLN
Freq: Once | INTRAVENOUS | Status: AC
  Administered 2018-07-20: 13:00:00 via INTRAVENOUS
  Filled 2018-07-20: qty 5

## 2018-07-20 MED ORDER — SODIUM CHLORIDE 0.9% FLUSH
10.0000 mL | INTRAVENOUS | Status: DC | PRN
  Administered 2018-07-20: 10 mL
  Filled 2018-07-20: qty 10

## 2018-07-20 MED ORDER — LORAZEPAM 2 MG/ML IJ SOLN
INTRAMUSCULAR | Status: AC
  Filled 2018-07-20: qty 1

## 2018-07-20 MED ORDER — PALONOSETRON HCL INJECTION 0.25 MG/5ML
INTRAVENOUS | Status: AC
  Filled 2018-07-20: qty 5

## 2018-07-20 NOTE — Patient Instructions (Signed)
[facility] Discharge Instructions for Patients Receiving Chemotherapy  Today you received the following chemotherapy agents Adriamycin, Vinblastine, and DTIC  To help prevent nausea and vomiting after your treatment, we encourage you to take your nausea medication as directed   If you develop nausea and vomiting that is not controlled by your nausea medication, call the clinic.   BELOW ARE SYMPTOMS THAT SHOULD BE REPORTED IMMEDIATELY:  *FEVER GREATER THAN 100.5 F  *CHILLS WITH OR WITHOUT FEVER  NAUSEA AND VOMITING THAT IS NOT CONTROLLED WITH YOUR NAUSEA MEDICATION  *UNUSUAL SHORTNESS OF BREATH  *UNUSUAL BRUISING OR BLEEDING  TENDERNESS IN MOUTH AND THROAT WITH OR WITHOUT PRESENCE OF ULCERS  *URINARY PROBLEMS  *BOWEL PROBLEMS  UNUSUAL RASH Items with * indicate a potential emergency and should be followed up as soon as possible.  Feel free to call the clinic should you have any questions or concerns. The clinic phone number is [phone].  Please show the CHEMO ALERT CARD at check-in to the Emergency Department and triage nurse.   

## 2018-07-25 ENCOUNTER — Telehealth: Payer: Self-pay | Admitting: *Deleted

## 2018-07-25 NOTE — Telephone Encounter (Signed)
Rerouted to [facility] 1.

## 2018-07-25 NOTE — Telephone Encounter (Signed)
Forwarding to [employer] nurse

## 2018-07-25 NOTE — Telephone Encounter (Signed)
"  Adrienne BUCZKOWSKI [phone]) calling to let you all know I stopped the Prozac today.  I feel it's making things worse."  Noted Fluoxetine 20 mg daily order date was 09-05-2017.

## 2018-07-26 NOTE — Telephone Encounter (Addendum)
Telephone call to patient to get more information. Left message for return call.  Per the Chart- Patient was on it in January and put back on it in November by Cyril Mourning, NP due to concerns of returning depression.

## 2018-07-26 NOTE — Telephone Encounter (Signed)
I did not put patient on prozac. Was there any reasons she stopped it?

## 2018-08-03 ENCOUNTER — Telehealth: Payer: Self-pay | Admitting: Hematology and Oncology

## 2018-08-03 ENCOUNTER — Inpatient Hospital Stay: Payer: 59

## 2018-08-03 ENCOUNTER — Inpatient Hospital Stay (HOSPITAL_BASED_OUTPATIENT_CLINIC_OR_DEPARTMENT_OTHER): Payer: 59 | Admitting: Hematology and Oncology

## 2018-08-03 ENCOUNTER — Encounter: Payer: Self-pay | Admitting: Hematology and Oncology

## 2018-08-03 DIAGNOSIS — R232 Flushing: Secondary | ICD-10-CM

## 2018-08-03 DIAGNOSIS — C8171 Other classical Hodgkin lymphoma, lymph nodes of head, face, and neck: Secondary | ICD-10-CM | POA: Diagnosis not present

## 2018-08-03 DIAGNOSIS — F419 Anxiety disorder, unspecified: Secondary | ICD-10-CM | POA: Diagnosis not present

## 2018-08-03 DIAGNOSIS — C8170 Other classical Hodgkin lymphoma, unspecified site: Secondary | ICD-10-CM

## 2018-08-03 DIAGNOSIS — T451X5A Adverse effect of antineoplastic and immunosuppressive drugs, initial encounter: Secondary | ICD-10-CM

## 2018-08-03 DIAGNOSIS — Z5111 Encounter for antineoplastic chemotherapy: Secondary | ICD-10-CM | POA: Diagnosis not present

## 2018-08-03 DIAGNOSIS — Z3162 Encounter for fertility preservation counseling: Secondary | ICD-10-CM

## 2018-08-03 DIAGNOSIS — F329 Major depressive disorder, single episode, unspecified: Secondary | ICD-10-CM

## 2018-08-03 DIAGNOSIS — R110 Nausea: Secondary | ICD-10-CM | POA: Diagnosis not present

## 2018-08-03 LAB — COMPREHENSIVE METABOLIC PANEL
ALT: 27 U/L (ref 0–44)
AST: 23 U/L (ref 15–41)
Albumin: 3.7 g/dL (ref 3.5–5.0)
Alkaline Phosphatase: 67 U/L (ref 38–126)
Anion gap: 9 (ref 5–15)
BUN: 14 mg/dL (ref 6–20)
CO2: 24 mmol/L (ref 22–32)
Calcium: 9.2 mg/dL (ref 8.9–10.3)
Chloride: 108 mmol/L (ref 98–111)
Creatinine, Ser: 0.81 mg/dL (ref 0.44–1.00)
GFR calc Af Amer: >60 mL/min (ref >60)
GFR calc non Af Amer: >60 mL/min (ref >60)
Glucose, Bld: 104 mg/dL — ABNORMAL HIGH (ref 70–99)
Potassium: 3.6 mmol/L (ref 3.5–5.1)
Sodium: 141 mmol/L (ref 135–145)
Total Bilirubin: 0.4 mg/dL (ref 0.3–1.2)
Total Protein: 6.8 g/dL (ref 6.5–8.1)

## 2018-08-03 LAB — CBC WITH DIFFERENTIAL/PLATELET
ABS IMMATURE GRANULOCYTES: 0.02 10*3/uL (ref 0.00–0.07)
Basophils Absolute: 0.1 10*3/uL (ref 0.0–0.1)
Basophils Relative: 1 %
EOS ABS: 0.5 10*3/uL (ref 0.0–0.5)
Eosinophils Relative: 10 %
HCT: 35.7 % — ABNORMAL LOW (ref 36.0–46.0)
Hemoglobin: 11.5 g/dL — ABNORMAL LOW (ref 12.0–15.0)
IMMATURE GRANULOCYTES: 0 %
Lymphocytes Relative: 26 %
Lymphs Abs: 1.3 10*3/uL (ref 0.7–4.0)
MCH: 29.7 pg (ref 26.0–34.0)
MCHC: 32.2 g/dL (ref 30.0–36.0)
MCV: 92.2 fL (ref 80.0–100.0)
Monocytes Absolute: 0.9 10*3/uL (ref 0.1–1.0)
Monocytes Relative: 17 %
Neutro Abs: 2.2 10*3/uL (ref 1.7–7.7)
Neutrophils Relative %: 46 %
PLATELETS: 176 10*3/uL (ref 150–400)
RBC: 3.87 MIL/uL (ref 3.87–5.11)
RDW: 14.2 % (ref 11.5–15.5)
WBC: 5 10*3/uL (ref 4.0–10.5)
nRBC: 0 % (ref 0.0–0.2)

## 2018-08-03 LAB — PREGNANCY, URINE: Preg Test, Ur: NEGATIVE

## 2018-08-03 MED ORDER — LORAZEPAM 2 MG/ML IJ SOLN
1.0000 mg | Freq: Once | INTRAMUSCULAR | Status: AC
  Administered 2018-08-03: 1 mg via INTRAVENOUS

## 2018-08-03 MED ORDER — LORAZEPAM 1 MG PO TABS: 1.0000 mg | ORAL_TABLET | Freq: Three times a day (TID) | ORAL | 0 refills | Status: DC | PRN

## 2018-08-03 MED ORDER — SODIUM CHLORIDE 0.9 % IV SOLN
375.0000 mg/m2 | Freq: Once | INTRAVENOUS | Status: AC
  Administered 2018-08-03: 750 mg via INTRAVENOUS
  Filled 2018-08-03: qty 75

## 2018-08-03 MED ORDER — SODIUM CHLORIDE 0.9% FLUSH
10.0000 mL | Freq: Once | INTRAVENOUS | Status: AC
  Administered 2018-08-03: 10 mL
  Filled 2018-08-03: qty 10

## 2018-08-03 MED ORDER — LEUPROLIDE ACETATE 7.5 MG IM KIT
7.5000 mg | PACK | Freq: Once | INTRAMUSCULAR | Status: AC
  Administered 2018-08-03: 7.5 mg via INTRAMUSCULAR
  Filled 2018-08-03: qty 7.5

## 2018-08-03 MED ORDER — HEPARIN SOD (PORK) LOCK FLUSH 100 UNIT/ML IV SOLN
500.0000 [IU] | Freq: Once | INTRAVENOUS | Status: AC | PRN
  Administered 2018-08-03: 500 [IU]
  Filled 2018-08-03: qty 5

## 2018-08-03 MED ORDER — SODIUM CHLORIDE 0.9 % IV SOLN
Freq: Once | INTRAVENOUS | Status: AC
  Administered 2018-08-03: 11:00:00 via INTRAVENOUS
  Filled 2018-08-03: qty 250

## 2018-08-03 MED ORDER — DOXORUBICIN HCL CHEMO IV INJECTION 2 MG/ML
25.0000 mg/m2 | Freq: Once | INTRAVENOUS | Status: AC
  Administered 2018-08-03: 50 mg via INTRAVENOUS
  Filled 2018-08-03: qty 25

## 2018-08-03 MED ORDER — SODIUM CHLORIDE 0.9 % IV SOLN
Freq: Once | INTRAVENOUS | Status: AC
  Administered 2018-08-03: 11:00:00 via INTRAVENOUS
  Filled 2018-08-03: qty 5

## 2018-08-03 MED ORDER — VINBLASTINE SULFATE CHEMO INJECTION 1 MG/ML
3.0000 mg/m2 | Freq: Once | INTRAVENOUS | Status: AC
  Administered 2018-08-03: 6 mg via INTRAVENOUS
  Filled 2018-08-03: qty 6

## 2018-08-03 MED ORDER — LORAZEPAM 2 MG/ML IJ SOLN
INTRAMUSCULAR | Status: AC
  Filled 2018-08-03: qty 1

## 2018-08-03 MED ORDER — PALONOSETRON HCL INJECTION 0.25 MG/5ML
0.2500 mg | Freq: Once | INTRAVENOUS | Status: AC
  Administered 2018-08-03: 0.25 mg via INTRAVENOUS

## 2018-08-03 MED ORDER — SODIUM CHLORIDE 0.9% FLUSH
10.0000 mL | INTRAVENOUS | Status: DC | PRN
  Administered 2018-08-03: 10 mL
  Filled 2018-08-03: qty 10

## 2018-08-03 MED ORDER — PALONOSETRON HCL INJECTION 0.25 MG/5ML
INTRAVENOUS | Status: AC
  Filled 2018-08-03: qty 5

## 2018-08-03 NOTE — Telephone Encounter (Signed)
Scheduled appt per 12/27 los - pt is aware of appt added - no print out needed per patient- my chart active.

## 2018-08-03 NOTE — Assessment & Plan Note (Signed)
Her hot flashes could be related to side effects of treatment causing menopausal symptoms.  The Prozac has helped. After her treatment is completed, we will discontinue Lupron

## 2018-08-03 NOTE — Assessment & Plan Note (Signed)
Recommend antiemetics as needed.

## 2018-08-03 NOTE — Assessment & Plan Note (Signed)
She tolerated treatment well except for some mild nausea and hot flashes and mood changes She also take tramadol for a few days after treatment for bone aches We will proceed with cycle 6 without dose adjustment I plan to repeat PET CT scan early February at the conclusion of her treatment

## 2018-08-03 NOTE — Patient Instructions (Addendum)
[facility] Discharge Instructions for Patients Receiving Chemotherapy  Today you received the following chemotherapy agents:  Doxyrubicin, Vinblastine, Dacarbezine  To help prevent nausea and vomiting after your treatment, we encourage you to take your nausea medication as prescribed.   If you develop nausea and vomiting that is not controlled by your nausea medication, call the clinic.   BELOW ARE SYMPTOMS THAT SHOULD BE REPORTED IMMEDIATELY:  *FEVER GREATER THAN 100.5 F  *CHILLS WITH OR WITHOUT FEVER  NAUSEA AND VOMITING THAT IS NOT CONTROLLED WITH YOUR NAUSEA MEDICATION  *UNUSUAL SHORTNESS OF BREATH  *UNUSUAL BRUISING OR BLEEDING  TENDERNESS IN MOUTH AND THROAT WITH OR WITHOUT PRESENCE OF ULCERS  *URINARY PROBLEMS  *BOWEL PROBLEMS  UNUSUAL RASH Items with * indicate a potential emergency and should be followed up as soon as possible.  Feel free to call the clinic should you have any questions or concerns. The clinic phone number is [phone].  Please show the [place] at check-in to the Emergency Department and triage nurse.

## 2018-08-03 NOTE — Assessment & Plan Note (Signed)
She was started on Prozac and take lorazepam as needed She has good network of support at home.  She felt that her depression and anxiety is well controlled

## 2018-08-03 NOTE — Progress Notes (Signed)
[facility] OFFICE PROGRESS NOTE  Patient Care Team: [name], MD as PCP - General (Family Medicine)  ASSESSMENT & PLAN:  Classical Hodgkin lymphoma [facility]) She tolerated treatment well except for some mild nausea and hot flashes and mood changes She also take tramadol for a few days after treatment for bone aches We will proceed with cycle 6 without dose adjustment I plan to repeat PET CT scan early February at the conclusion of her treatment  Anxiety She was started on Prozac and take lorazepam as needed She has good network of support at home.  She felt that her depression and anxiety is well controlled  Chemotherapy-induced nausea Recommend antiemetics as needed.  Hot flashes Her hot flashes could be related to side effects of treatment causing menopausal symptoms.  The Prozac has helped. After her treatment is completed, we will discontinue Lupron   Orders Placed This Encounter  Procedures  . NM PET Image Restag (PS) Skull Base To Thigh    Standing Status:   Future    Standing Expiration Date:   08/04/2019    Order Specific Question:   If indicated for the ordered procedure, I authorize the administration of a radiopharmaceutical per Radiology protocol    Answer:   Yes    Order Specific Question:   Preferred imaging location?    Answer:   [facility]    Order Specific Question:   Radiology Contrast Protocol - do NOT remove file path    Answer:   [email or web address]    Order Specific Question:   Is the patient pregnant?    Answer:   No    INTERVAL HISTORY: Please see below for problem oriented charting. She is seen prior to cycle 6 of treatment She tolerated treatment well although she has gained some weight, nausea, hot flashes and anxiety/depression She also takes some tramadol for few days after treatment for bony aches She denies constipation.  No worsening peripheral neuropathy Her anxiety and depression are  well controlled with current prescription medicine Denies recent infection or new lymphadenopathy SUMMARY OF ONCOLOGIC HISTORY:   Classical Hodgkin lymphoma ([place])   01/22/2018 Imaging    CT scan of neck: Adenopathy throughout the right lateral neck, right supraclavicular fossa, and upper mediastinum. There is no specific imaging feature, lymphoma or atypical infection are the primary considerations and excisional biopsy should be considered if there is no diagnosis by history or labs.     01/31/2018 Pathology Results    Lymph node for lymphoma, right supraclavicular / right cervical - ATYPICAL LYMPHOID PROLIFERATION. - SEE COMMENT. Microscopic Comment Sections show a few very small needle core biopsy fragments of lymph nodal tissue displaying a polymorphous cellular proliferation of small lymphocytes, plasma cells, eosinophils and histiocytes in addition to variable numbers of large mononuclear and multilobated lymphoid appearing cells with variably prominent nucleoli. Flow cytometric was performed ([id number]) and failed to show any monoclonal B-cell population or abnormal T-cell phenotype. Immunohistochemical stains were performed, including CD15, CD20, CD3, CD30, LCA, and PAX-5 with appropriate controls. The large atypical lymphoid appearing cells are positive for CD15, CD30, CD20 and PAX-5 and negative for CD3 and LCA. The small lymphoid component in the background is mostly composed of T cells. The morphologic and phenotypic features are atypical and highly suspicious for involvement by a lymphoproliferative process, particularly classical Hodgkin lymphoma. Excisional biopsy is recommended.    01/31/2018 Procedure    Ultrasound-guided core biopsies of right cervical lymph nodes.    02/15/2018 Pathology Results  Lymph node for lymphoma, Right Cervical - CLASSICAL HODGKIN LYMPHOMA, NODULAR SCLEROSIS TYPE    03/05/2018 PET scan    1. Hypermetabolic RIGHT level 2 lymph nodes, supraclavicular  lymph nodes, sub pectoralis lymph nodes and RIGHT axillary lymph nodes. 2. Hypermetabolic mediastinal lymph nodes. 3. No evidence of lymphoma beneath the diaphragms. Normal spleen. 4. Normal bone marrow.    03/05/2018 Imaging    LV EF: 55% -  60%    03/06/2018 Cancer Staging    Staging form: Hodgkin and Non-Hodgkin Lymphoma, AJCC 8th Edition - Clinical stage from 03/06/2018: Stage II (Hodgkin lymphoma, B - Symptoms) - Signed by Heath Lark, MD on 03/06/2018    03/08/2018 -  Chemotherapy    The patient had ABVD for chemotherapy treatment. After interim scan showed complete response, Bleomycin was discontinued     05/01/2018 PET scan    1. Near complete resolution of metabolic activity within RIGHT cervical lymph nodes, mediastinal nodes, RIGHT supraclavicular nodes, and axillary nodes ( [place] 1 and 2). 2. No evidence lymphoma progression. 3. Normal spleen and marrow. 4. Extensive hypermetabolic brown fat within the neck and thorax makes evaluation challenging.    06/05/2018 Echocardiogram    LV EF: 60% -  65%     REVIEW OF SYSTEMS:   Constitutional: Denies fevers, chills or abnormal weight loss Eyes: Denies blurriness of vision Ears, nose, mouth, throat, and face: Denies mucositis or sore throat Respiratory: Denies cough, dyspnea or wheezes Cardiovascular: Denies palpitation, chest discomfort or lower extremity swelling Skin: Denies abnormal skin rashes Lymphatics: Denies new lymphadenopathy or easy bruising Neurological:Denies numbness, tingling or new weaknesses Behavioral/Psych: Mood is stable, no new changes  All other systems were reviewed with the patient and are negative.  I have reviewed the past medical history, past surgical history, social history and family history with the patient and they are unchanged from previous note.  ALLERGIES:  has No Known Allergies.  MEDICATIONS:  Current Outpatient Medications  Medication Sig Dispense Refill  . FLUoxetine (PROZAC)  20 MG capsule Take 1 capsule (20 mg total) by mouth daily. 30 capsule 2  . levonorgestrel (MIRENA, 52 MG,) 20 MCG/24HR IUD Mirena 20 mcg/24 hours (5 yrs) 52 mg intrauterine device  Take 1 device by intrauterine route.    . lidocaine-prilocaine (EMLA) cream Apply 1 application topically as needed. 30 g 6  . LORazepam (ATIVAN) 1 MG tablet Take 1 tablet (1 mg total) by mouth every 8 (eight) hours as needed for anxiety or sleep (nausea). 60 tablet 0  . ondansetron (ZOFRAN) 8 MG tablet Take 1 tablet (8 mg total) by mouth every 8 (eight) hours as needed. Start on the third day after chemotherapy. (Patient not taking: Reported on 06/08/2018) 30 tablet 1  . prochlorperazine (COMPAZINE) 10 MG tablet Take 1 tablet (10 mg total) by mouth every 6 (six) hours as needed (Nausea or vomiting). (Patient not taking: Reported on 06/08/2018) 30 tablet 1  . traMADol (ULTRAM) 50 MG tablet Take 1 tablet (50 mg total) by mouth every 6 (six) hours as needed. 30 tablet 0  . Tretinoin Microsphere (RETIN-A MICRO PUMP) 0.08 % GEL Retin-A Micro Pump 0.08 % topical gel  APPLY AA QHS     No current facility-administered medications for this visit.     PHYSICAL EXAMINATION: ECOG PERFORMANCE STATUS: 1 - Symptomatic but completely ambulatory  Vitals:   08/03/18 0953  BP: 102/72  Pulse: 87  Resp: 16  Temp: 98.1 F (36.7 C)  SpO2: 100%   Filed Weights  08/03/18 0953  Weight: 221 lb 12.8 oz (100.6 kg)    GENERAL:alert, no distress and comfortable SKIN: skin color, texture, turgor are normal, no rashes or significant lesions EYES: normal, Conjunctiva are pink and non-injected, sclera clear OROPHARYNX:no exudate, no erythema and lips, buccal mucosa, and tongue normal  NECK: supple, thyroid normal size, non-tender, without nodularity LYMPH:  no palpable lymphadenopathy in the cervical, axillary or inguinal LUNGS: clear to auscultation and percussion with normal breathing effort HEART: regular rate & rhythm and no  murmurs and no lower extremity edema ABDOMEN:abdomen soft, non-tender and normal bowel sounds Musculoskeletal:no cyanosis of digits and no clubbing  NEURO: alert & oriented x 3 with fluent speech, no focal motor/sensory deficits  LABORATORY DATA:  I have reviewed the data as listed    Component Value Date/Time   NA 141 07/20/2018 1200   K 4.1 07/20/2018 1200   CL 108 07/20/2018 1200   CO2 23 07/20/2018 1200   GLUCOSE 83 07/20/2018 1200   BUN 15 07/20/2018 1200   CREATININE 0.80 07/20/2018 1200   CREATININE 0.76 03/26/2018 1147   CALCIUM 9.2 07/20/2018 1200   PROT 6.9 07/20/2018 1200   ALBUMIN 3.9 07/20/2018 1200   AST 19 07/20/2018 1200   AST 14 (L) 03/26/2018 1147   ALT 22 07/20/2018 1200   ALT 24 03/26/2018 1147   ALKPHOS 64 07/20/2018 1200   BILITOT 0.4 07/20/2018 1200   BILITOT 1.0 03/26/2018 1147   GFRNONAA >60 07/20/2018 1200   GFRNONAA >60 03/26/2018 1147   GFRAA >60 07/20/2018 1200   GFRAA >60 03/26/2018 1147    No results found for: SPEP, UPEP  Lab Results  Component Value Date   WBC 5.0 08/03/2018   NEUTROABS 2.2 08/03/2018   HGB 11.5 (L) 08/03/2018   HCT 35.7 (L) 08/03/2018   MCV 92.2 08/03/2018   PLT 176 08/03/2018      Chemistry      Component Value Date/Time   NA 141 07/20/2018 1200   K 4.1 07/20/2018 1200   CL 108 07/20/2018 1200   CO2 23 07/20/2018 1200   BUN 15 07/20/2018 1200   CREATININE 0.80 07/20/2018 1200   CREATININE 0.76 03/26/2018 1147      Component Value Date/Time   CALCIUM 9.2 07/20/2018 1200   ALKPHOS 64 07/20/2018 1200   AST 19 07/20/2018 1200   AST 14 (L) 03/26/2018 1147   ALT 22 07/20/2018 1200   ALT 24 03/26/2018 1147   BILITOT 0.4 07/20/2018 1200   BILITOT 1.0 03/26/2018 1147       All questions were answered. The patient knows to call the clinic with any problems, questions or concerns. No barriers to learning was detected.  I spent 15 minutes counseling the patient face to face. The total time spent in the  appointment was 20 minutes and more than 50% was on counseling and review of test results  Heath Lark, MD 08/03/2018 10:04 AM

## 2018-08-16 ENCOUNTER — Telehealth: Payer: Self-pay

## 2018-08-16 NOTE — Telephone Encounter (Signed)
Faxed completed form to [employer] [place] assistance program at [phone].

## 2018-08-17 ENCOUNTER — Inpatient Hospital Stay: Payer: 59

## 2018-08-17 ENCOUNTER — Inpatient Hospital Stay: Payer: 59 | Attending: Hematology and Oncology

## 2018-08-17 VITALS — BP 120/81 | HR 78 | Temp 98.3°F | Resp 16

## 2018-08-17 DIAGNOSIS — Z5111 Encounter for antineoplastic chemotherapy: Secondary | ICD-10-CM | POA: Insufficient documentation

## 2018-08-17 DIAGNOSIS — C8171 Other classical Hodgkin lymphoma, lymph nodes of head, face, and neck: Secondary | ICD-10-CM | POA: Insufficient documentation

## 2018-08-17 DIAGNOSIS — C8170 Other classical Hodgkin lymphoma, unspecified site: Secondary | ICD-10-CM

## 2018-08-17 LAB — CBC WITH DIFFERENTIAL/PLATELET
ABS IMMATURE GRANULOCYTES: 0.02 10*3/uL (ref 0.00–0.07)
Basophils Absolute: 0.1 10*3/uL (ref 0.0–0.1)
Basophils Relative: 1 %
Eosinophils Absolute: 0.3 10*3/uL (ref 0.0–0.5)
Eosinophils Relative: 6 %
HCT: 36.9 % (ref 36.0–46.0)
Hemoglobin: 12.2 g/dL (ref 12.0–15.0)
Immature Granulocytes: 0 %
Lymphocytes Relative: 27 %
Lymphs Abs: 1.3 10*3/uL (ref 0.7–4.0)
MCH: 29.8 pg (ref 26.0–34.0)
MCHC: 33.1 g/dL (ref 30.0–36.0)
MCV: 90.2 fL (ref 80.0–100.0)
MONO ABS: 0.8 10*3/uL (ref 0.1–1.0)
Monocytes Relative: 15 %
Neutro Abs: 2.5 10*3/uL (ref 1.7–7.7)
Neutrophils Relative %: 51 %
Platelets: 272 10*3/uL (ref 150–400)
RBC: 4.09 MIL/uL (ref 3.87–5.11)
RDW: 14 % (ref 11.5–15.5)
WBC: 4.9 10*3/uL (ref 4.0–10.5)
nRBC: 0 % (ref 0.0–0.2)

## 2018-08-17 LAB — COMPREHENSIVE METABOLIC PANEL
ALT: 38 U/L (ref 0–44)
AST: 26 U/L (ref 15–41)
Albumin: 4.1 g/dL (ref 3.5–5.0)
Alkaline Phosphatase: 67 U/L (ref 38–126)
Anion gap: 11 (ref 5–15)
BUN: 14 mg/dL (ref 6–20)
CO2: 24 mmol/L (ref 22–32)
Calcium: 9.9 mg/dL (ref 8.9–10.3)
Chloride: 106 mmol/L (ref 98–111)
Creatinine, Ser: 0.78 mg/dL (ref 0.44–1.00)
GFR calc Af Amer: >60 mL/min (ref >60)
GFR calc non Af Amer: >60 mL/min (ref >60)
Glucose, Bld: 89 mg/dL (ref 70–99)
Potassium: 4 mmol/L (ref 3.5–5.1)
Sodium: 141 mmol/L (ref 135–145)
Total Bilirubin: 0.6 mg/dL (ref 0.3–1.2)
Total Protein: 7.3 g/dL (ref 6.5–8.1)

## 2018-08-17 LAB — PREGNANCY, URINE: Preg Test, Ur: NEGATIVE

## 2018-08-17 MED ORDER — DOXORUBICIN HCL CHEMO IV INJECTION 2 MG/ML
25.0000 mg/m2 | Freq: Once | INTRAVENOUS | Status: AC
  Administered 2018-08-17: 50 mg via INTRAVENOUS
  Filled 2018-08-17: qty 25

## 2018-08-17 MED ORDER — LORAZEPAM 2 MG/ML IJ SOLN
INTRAMUSCULAR | Status: AC
  Filled 2018-08-17: qty 1

## 2018-08-17 MED ORDER — SODIUM CHLORIDE 0.9% FLUSH
10.0000 mL | INTRAVENOUS | Status: DC | PRN
  Administered 2018-08-17: 10 mL
  Filled 2018-08-17: qty 10

## 2018-08-17 MED ORDER — LORAZEPAM 2 MG/ML IJ SOLN
1.0000 mg | Freq: Once | INTRAMUSCULAR | Status: AC
  Administered 2018-08-17: 1 mg via INTRAVENOUS

## 2018-08-17 MED ORDER — VINBLASTINE SULFATE CHEMO INJECTION 1 MG/ML
3.0000 mg/m2 | Freq: Once | INTRAVENOUS | Status: AC
  Administered 2018-08-17: 6 mg via INTRAVENOUS
  Filled 2018-08-17: qty 6

## 2018-08-17 MED ORDER — PALONOSETRON HCL INJECTION 0.25 MG/5ML
INTRAVENOUS | Status: AC
  Filled 2018-08-17: qty 5

## 2018-08-17 MED ORDER — HEPARIN SOD (PORK) LOCK FLUSH 100 UNIT/ML IV SOLN
500.0000 [IU] | Freq: Once | INTRAVENOUS | Status: AC | PRN
  Administered 2018-08-17: 500 [IU]
  Filled 2018-08-17: qty 5

## 2018-08-17 MED ORDER — SODIUM CHLORIDE 0.9 % IV SOLN
375.0000 mg/m2 | Freq: Once | INTRAVENOUS | Status: AC
  Administered 2018-08-17: 750 mg via INTRAVENOUS
  Filled 2018-08-17: qty 75

## 2018-08-17 MED ORDER — SODIUM CHLORIDE 0.9 % IV SOLN
Freq: Once | INTRAVENOUS | Status: AC
  Administered 2018-08-17: 10:00:00 via INTRAVENOUS
  Filled 2018-08-17: qty 250

## 2018-08-17 MED ORDER — PALONOSETRON HCL INJECTION 0.25 MG/5ML
0.2500 mg | Freq: Once | INTRAVENOUS | Status: AC
  Administered 2018-08-17: 0.25 mg via INTRAVENOUS

## 2018-08-17 MED ORDER — SODIUM CHLORIDE 0.9 % IV SOLN
Freq: Once | INTRAVENOUS | Status: AC
  Administered 2018-08-17: 10:00:00 via INTRAVENOUS
  Filled 2018-08-17: qty 5

## 2018-08-17 NOTE — Patient Instructions (Signed)
[name] Discharge Instructions for Patients Receiving Chemotherapy  Today you received the following chemotherapy agents:  Doxyrubicin, Vinblastine, Dacarbezine  To help prevent nausea and vomiting after your treatment, we encourage you to take your nausea medication as prescribed.   If you develop nausea and vomiting that is not controlled by your nausea medication, call the clinic.   BELOW ARE SYMPTOMS THAT SHOULD BE REPORTED IMMEDIATELY:  *FEVER GREATER THAN 100.5 F  *CHILLS WITH OR WITHOUT FEVER  NAUSEA AND VOMITING THAT IS NOT CONTROLLED WITH YOUR NAUSEA MEDICATION  *UNUSUAL SHORTNESS OF BREATH  *UNUSUAL BRUISING OR BLEEDING  TENDERNESS IN MOUTH AND THROAT WITH OR WITHOUT PRESENCE OF ULCERS  *URINARY PROBLEMS  *BOWEL PROBLEMS  UNUSUAL RASH Items with * indicate a potential emergency and should be followed up as soon as possible.  Feel free to call the clinic should you have any questions or concerns. The clinic phone number is [phone].  Please show the [place] at check-in to the Emergency Department and triage nurse.

## 2018-09-03 ENCOUNTER — Other Ambulatory Visit: Payer: Self-pay

## 2018-09-03 ENCOUNTER — Emergency Department (HOSPITAL_COMMUNITY)
Admission: EM | Admit: 2018-09-03 | Discharge: 2018-09-03 | Disposition: A | Discharge status: Home / Self Care | Payer: 59 | Attending: Emergency Medicine | Admitting: Emergency Medicine

## 2018-09-03 ENCOUNTER — Encounter (HOSPITAL_COMMUNITY): Payer: Self-pay

## 2018-09-03 ENCOUNTER — Emergency Department (HOSPITAL_COMMUNITY): Payer: 59

## 2018-09-03 DIAGNOSIS — Z79899 Other long term (current) drug therapy: Secondary | ICD-10-CM | POA: Insufficient documentation

## 2018-09-03 DIAGNOSIS — C8190 Hodgkin lymphoma, unspecified, unspecified site: Secondary | ICD-10-CM | POA: Diagnosis not present

## 2018-09-03 DIAGNOSIS — R6883 Chills (without fever): Secondary | ICD-10-CM | POA: Diagnosis not present

## 2018-09-03 DIAGNOSIS — R1030 Lower abdominal pain, unspecified: Secondary | ICD-10-CM | POA: Diagnosis present

## 2018-09-03 DIAGNOSIS — Z87891 Personal history of nicotine dependence: Secondary | ICD-10-CM | POA: Diagnosis not present

## 2018-09-03 DIAGNOSIS — R1031 Right lower quadrant pain: Secondary | ICD-10-CM | POA: Diagnosis not present

## 2018-09-03 DIAGNOSIS — R109 Unspecified abdominal pain: Secondary | ICD-10-CM

## 2018-09-03 HISTORY — DX: Hodgkin lymphoma, unspecified, unspecified site: C81.90

## 2018-09-03 LAB — CBC
HCT: 36.1 % (ref 36.0–46.0)
Hemoglobin: 11.7 g/dL — ABNORMAL LOW (ref 12.0–15.0)
MCH: 29.4 pg (ref 26.0–34.0)
MCHC: 32.4 g/dL (ref 30.0–36.0)
MCV: 90.7 fL (ref 80.0–100.0)
NRBC: 0 % (ref 0.0–0.2)
Platelets: 216 10*3/uL (ref 150–400)
RBC: 3.98 MIL/uL (ref 3.87–5.11)
RDW: 13.7 % (ref 11.5–15.5)
WBC: 10.6 10*3/uL — ABNORMAL HIGH (ref 4.0–10.5)

## 2018-09-03 LAB — COMPREHENSIVE METABOLIC PANEL
ALT: 28 U/L (ref 0–44)
AST: 23 U/L (ref 15–41)
Albumin: 3.9 g/dL (ref 3.5–5.0)
Alkaline Phosphatase: 51 U/L (ref 38–126)
Anion gap: 9 (ref 5–15)
BUN: 19 mg/dL (ref 6–20)
CO2: 21 mmol/L — AB (ref 22–32)
Calcium: 9.1 mg/dL (ref 8.9–10.3)
Chloride: 106 mmol/L (ref 98–111)
Creatinine, Ser: 0.68 mg/dL (ref 0.44–1.00)
GFR calc Af Amer: >60 mL/min (ref >60)
GFR calc non Af Amer: >60 mL/min (ref >60)
Glucose, Bld: 102 mg/dL — ABNORMAL HIGH (ref 70–99)
Potassium: 3.9 mmol/L (ref 3.5–5.1)
Sodium: 136 mmol/L (ref 135–145)
Total Bilirubin: 0.6 mg/dL (ref 0.3–1.2)
Total Protein: 6.7 g/dL (ref 6.5–8.1)

## 2018-09-03 LAB — URINALYSIS, ROUTINE W REFLEX MICROSCOPIC
BILIRUBIN URINE: NEGATIVE
Glucose, UA: NEGATIVE mg/dL
Ketones, ur: NEGATIVE mg/dL
LEUKOCYTES UA: NEGATIVE
Nitrite: NEGATIVE
Protein, ur: NEGATIVE mg/dL
Specific Gravity, Urine: 1.015 (ref 1.005–1.030)
pH: 6 (ref 5.0–8.0)

## 2018-09-03 LAB — DIFFERENTIAL
BASOS ABS: 0.1 10*3/uL (ref 0.0–0.1)
Basophils Relative: 1 %
Eosinophils Absolute: 0.1 10*3/uL (ref 0.0–0.5)
Eosinophils Relative: 1 %
Lymphocytes Relative: 11 %
Lymphs Abs: 1.1 10*3/uL (ref 0.7–4.0)
Monocytes Absolute: 1.4 10*3/uL — ABNORMAL HIGH (ref 0.1–1.0)
Monocytes Relative: 13 %
Neutro Abs: 8 10*3/uL — ABNORMAL HIGH (ref 1.7–7.7)
Neutrophils Relative %: 74 %

## 2018-09-03 LAB — LACTIC ACID, PLASMA
Lactic Acid, Venous: 0.7 mmol/L (ref 0.5–1.9)
Lactic Acid, Venous: 0.7 mmol/L (ref 0.5–1.9)

## 2018-09-03 LAB — WET PREP, GENITAL
Clue Cells Wet Prep HPF POC: NONE SEEN
Sperm: NONE SEEN
Trich, Wet Prep: NONE SEEN
Yeast Wet Prep HPF POC: NONE SEEN

## 2018-09-03 LAB — I-STAT BETA HCG BLOOD, ED (MC, WL, AP ONLY): I-stat hCG, quantitative: <5 m[IU]/mL (ref <5)

## 2018-09-03 LAB — LIPASE, BLOOD: Lipase: 26 U/L (ref 11–51)

## 2018-09-03 LAB — HIV ANTIBODY (ROUTINE TESTING W REFLEX): HIV SCREEN 4TH GENERATION: NONREACTIVE

## 2018-09-03 LAB — RPR: RPR Ser Ql: NONREACTIVE

## 2018-09-03 MED ORDER — DICYCLOMINE HCL 20 MG PO TABS: 20.0000 mg | ORAL_TABLET | Freq: Two times a day (BID) | ORAL | 0 refills | Status: DC

## 2018-09-03 MED ORDER — SODIUM CHLORIDE 0.9 % IV BOLUS
1000.0000 mL | Freq: Once | INTRAVENOUS | Status: AC
  Administered 2018-09-03: 1000 mL via INTRAVENOUS

## 2018-09-03 MED ORDER — MORPHINE SULFATE (PF) 4 MG/ML IV SOLN
4.0000 mg | Freq: Once | INTRAVENOUS | Status: AC
  Administered 2018-09-03: 4 mg via INTRAVENOUS
  Filled 2018-09-03: qty 1

## 2018-09-03 MED ORDER — HYDROMORPHONE HCL 1 MG/ML IJ SOLN
1.0000 mg | Freq: Once | INTRAMUSCULAR | Status: AC
  Administered 2018-09-03: 1 mg via INTRAVENOUS
  Filled 2018-09-03: qty 1

## 2018-09-03 MED ORDER — HEPARIN SOD (PORK) LOCK FLUSH 100 UNIT/ML IV SOLN
500.0000 [IU] | Freq: Once | INTRAVENOUS | Status: AC
  Administered 2018-09-03: 500 [IU]
  Filled 2018-09-03: qty 5

## 2018-09-03 MED ORDER — ONDANSETRON HCL 4 MG/2ML IJ SOLN
4.0000 mg | Freq: Once | INTRAMUSCULAR | Status: AC
  Administered 2018-09-03: 4 mg via INTRAVENOUS
  Filled 2018-09-03: qty 2

## 2018-09-03 MED ORDER — IOPAMIDOL (ISOVUE-300) INJECTION 61%
INTRAVENOUS | Status: AC
  Filled 2018-09-03: qty 100

## 2018-09-03 MED ORDER — SODIUM CHLORIDE (PF) 0.9 % IJ SOLN
INTRAMUSCULAR | Status: AC
  Filled 2018-09-03: qty 50

## 2018-09-03 MED ORDER — ALTEPLASE 2 MG IJ SOLR
2.0000 mg | Freq: Once | INTRAMUSCULAR | Status: AC
  Administered 2018-09-03: 2 mg
  Filled 2018-09-03: qty 2

## 2018-09-03 MED ORDER — STERILE WATER FOR INJECTION IJ SOLN
INTRAMUSCULAR | Status: AC
  Filled 2018-09-03: qty 10

## 2018-09-03 MED ORDER — SODIUM CHLORIDE 0.9% FLUSH
3.0000 mL | Freq: Once | INTRAVENOUS | Status: AC
  Administered 2018-09-03: 3 mL via INTRAVENOUS

## 2018-09-03 MED ORDER — HYDROCODONE-ACETAMINOPHEN 5-325 MG PO TABS: 1.0000 | ORAL_TABLET | Freq: Four times a day (QID) | ORAL | 0 refills | Status: DC | PRN

## 2018-09-03 MED ORDER — ONDANSETRON 4 MG PO TBDP: 4.0000 mg | ORAL_TABLET | Freq: Three times a day (TID) | ORAL | 0 refills | Status: DC | PRN

## 2018-09-03 MED ORDER — IOPAMIDOL (ISOVUE-300) INJECTION 61%
100.0000 mL | Freq: Once | INTRAVENOUS | Status: AC | PRN
  Administered 2018-09-03: 100 mL via INTRAVENOUS

## 2018-09-03 NOTE — ED Provider Notes (Signed)
Adrienne [name] is a 28 y.o. female, presenting to the ED with abdominal pain beginning last night.  Upon my interview with the patient, her pain is well managed and she appears to be resting comfortably.   HPI from Antonietta Breach, PA-C: "28 year old female with a history of Hodgkin's lymphoma (last Doxyrubicin, Vinblastine, Dacarbezine chemo infusion on the 10th) presents to the emergency department for evaluation of lower domino pain.  She states that pain began suddenly at [employer].  It has remained constant.  It waxes and wanes in severity.  When pain is more severe, she describes it as sharp, needlelike pains.  It does not radiate, specifically not to her back.  She has had some associated nausea.  Tried using MiraLAX prior to arrival, but no other medications taken.  She has had 3 bowel movements today.  Continues to pass flatus.  No fevers, vomiting, diarrhea, dysuria, hematuria, vaginal bleeding, vaginal discharge. Reports hx of kidney stones in her father.  No history of abdominal surgeries."   Past Medical History:  Diagnosis Date  . Anxiety 2019  . Depression 2016  . Dyspnea 01/2009   with sternal area discomfort, Pt questions anxiety  . Frequent UTI   . Neutropenic fever ([place]) 04/18/2018   Past Surgical History:  Procedure Laterality Date  . INDUCED ABORTION    . LYMPH NODE BIOPSY Right 02/15/2018   Procedure: RIGHT CERVICAL LYMPH NODE EXCISIONAL BIOPSY;  Surgeon: Erroll Luna, MD;  Location: [facility];  Service: General;  Laterality: Right;  . MOUTH SURGERY    . PORTACATH PLACEMENT Left 03/07/2018   Procedure: INSERTION PORT-A-CATH WITH ULTRASOUND;  Surgeon: Erroll Luna, MD;  Location: [facility];  Service: General;  Laterality: Left;  . WRIST SURGERY      Physical Exam  BP (!) 100/48   Pulse 88   Temp 99.9 F (37.7 C) (Oral)   Resp (!) 21   Ht 5\' 9"  (1.753 m)   Wt 99.8 kg   SpO2 100%   BMI 32.49 kg/m   Physical Exam Vitals  signs and nursing note reviewed. Exam conducted with a chaperone present.  Constitutional:      General: She is not in acute distress.    Appearance: She is well-developed. She is not diaphoretic.  HENT:     Head: Normocephalic and atraumatic.     Mouth/Throat:     Mouth: Mucous membranes are moist.     Pharynx: Oropharynx is clear.  Eyes:     Conjunctiva/sclera: Conjunctivae normal.  Neck:     Musculoskeletal: Neck supple.  Cardiovascular:     Rate and Rhythm: Normal rate and regular rhythm.     Pulses: Normal pulses.     Heart sounds: Normal heart sounds.  Pulmonary:     Effort: Pulmonary effort is normal. No respiratory distress.     Breath sounds: Normal breath sounds.  Abdominal:     Palpations: Abdomen is soft.     Tenderness: There is abdominal tenderness. There is no guarding.    Genitourinary:    Comments: External genitalia normal Vagina with discharge - Small amount of thick, white discharge. Cervix  Normal - IUD strings noted negative for cervical motion tenderness Adnexa palpated, no masses, negative for tenderness noted Bladder palpated negative for tenderness Uterus palpated no masses, positive for tenderness  No inguinal lymphadenopathy. Otherwise normal female genitalia. RN, Crystal, served as Producer, television/film/video during exam. Musculoskeletal:     Right lower leg: No edema.  Left lower leg: No edema.  Lymphadenopathy:     Cervical: No cervical adenopathy.  Skin:    General: Skin is warm and dry.  Neurological:     Mental Status: She is alert.  Psychiatric:        Mood and Affect: Mood and affect normal.        Speech: Speech normal.        Behavior: Behavior normal.     ED Course/Procedures     Procedures   Abnormal Labs Reviewed  WET PREP, GENITAL - Abnormal; Notable for the following components:      Result Value   WBC, Wet Prep HPF POC MANY (*)    All other components within normal limits  COMPREHENSIVE METABOLIC PANEL - Abnormal; Notable for  the following components:   CO2 21 (*)    Glucose, Bld 102 (*)    All other components within normal limits  CBC - Abnormal; Notable for the following components:   WBC 10.6 (*)    Hemoglobin 11.7 (*)    All other components within normal limits  URINALYSIS, ROUTINE W REFLEX MICROSCOPIC - Abnormal; Notable for the following components:   Color, Urine STRAW (*)    Hgb urine dipstick SMALL (*)    Bacteria, UA RARE (*)    All other components within normal limits  DIFFERENTIAL - Abnormal; Notable for the following components:   Neutro Abs 8.0 (*)    Monocytes Absolute 1.4 (*)    All other components within normal limits   US Transvaginal Non-ob  Result Date: 09/03/2018 CLINICAL DATA:  Pelvic pain for several hours EXAM: TRANSABDOMINAL AND TRANSVAGINAL ULTRASOUND OF PELVIS DOPPLER ULTRASOUND OF OVARIES TECHNIQUE: Both transabdominal and transvaginal ultrasound examinations of the pelvis were performed. Transabdominal technique was performed for global imaging of the pelvis including uterus, ovaries, adnexal regions, and pelvic cul-de-sac. It was necessary to proceed with endovaginal exam following the transabdominal exam to visualize the ovaries. Color and duplex Doppler ultrasound was utilized to evaluate blood flow to the ovaries. COMPARISON:  CT from earlier in the same day. FINDINGS: Uterus Measurements: 6.7 x 2.7 x 4.3 cm. = volume: 40 mL. No fibroids or other mass visualized. Endometrium Thickness: 2.2 mm.  IUD is noted in place. Right ovary Measurements: 2.4 x 1.7 x 2.2 cm. = volume: 5 mL. Normal appearance/no adnexal mass. Left ovary Measurements: 2.3 x 1.7 x 1.1 cm. = volume: 2.2 mL. Normal appearance/no adnexal mass. Pulsed Doppler evaluation of both ovaries demonstrates normal low-resistance arterial and venous waveforms. Other findings No abnormal free fluid. IMPRESSION: Unremarkable pelvic ultrasound. IUD in place similar to that seen on recent CT. Electronically Signed   By: Inez Catalina M.D.   On: 09/03/2018 08:38   US Pelvis Complete  Result Date: 09/03/2018 CLINICAL DATA:  Pelvic pain for several hours EXAM: TRANSABDOMINAL AND TRANSVAGINAL ULTRASOUND OF PELVIS DOPPLER ULTRASOUND OF OVARIES TECHNIQUE: Both transabdominal and transvaginal ultrasound examinations of the pelvis were performed. Transabdominal technique was performed for global imaging of the pelvis including uterus, ovaries, adnexal regions, and pelvic cul-de-sac. It was necessary to proceed with endovaginal exam following the transabdominal exam to visualize the ovaries. Color and duplex Doppler ultrasound was utilized to evaluate blood flow to the ovaries. COMPARISON:  CT from earlier in the same day. FINDINGS: Uterus Measurements: 6.7 x 2.7 x 4.3 cm. = volume: 40 mL. No fibroids or other mass visualized. Endometrium Thickness: 2.2 mm.  IUD is noted in place. Right ovary Measurements: 2.4 x  1.7 x 2.2 cm. = volume: 5 mL. Normal appearance/no adnexal mass. Left ovary Measurements: 2.3 x 1.7 x 1.1 cm. = volume: 2.2 mL. Normal appearance/no adnexal mass. Pulsed Doppler evaluation of both ovaries demonstrates normal low-resistance arterial and venous waveforms. Other findings No abnormal free fluid. IMPRESSION: Unremarkable pelvic ultrasound. IUD in place similar to that seen on recent CT. Electronically Signed   By: Inez Catalina M.D.   On: 09/03/2018 08:38   Ct Abdomen Pelvis W Contrast  Result Date: 09/03/2018 CLINICAL DATA:  Lower abdominal pain. History of Hodgkin's lymphoma with recently completed chemotherapy. EXAM: CT ABDOMEN AND PELVIS WITH CONTRAST TECHNIQUE: Multidetector CT imaging of the abdomen and pelvis was performed using the standard protocol following bolus administration of intravenous contrast. CONTRAST:  173mL ISOVUE-300 IOPAMIDOL (ISOVUE-300) INJECTION 61% COMPARISON:  PET-CT 05/01/2018 FINDINGS: Lower chest: No consolidation or pleural effusion in the lung bases. Hepatobiliary: No focal liver  abnormality is seen. No gallstones, gallbladder wall thickening, or biliary dilatation. Pancreas: Unremarkable. Spleen: Unremarkable. Adrenals/Urinary Tract: Unremarkable adrenal glands. No evidence of renal mass, calculi, or hydronephrosis. Unremarkable bladder. Stomach/Bowel: The stomach is unremarkable. There is no evidence of bowel obstruction or inflammation. The appendix is unremarkable. Vascular/Lymphatic: Normal caliber of the abdominal aorta. Scattered subcentimeter para-aortic and right lower quadrant lymph nodes, similar to the prior PET-CT. Reproductive: IUD in place. Unremarkable ovaries. Other: No intraperitoneal free fluid. Musculoskeletal: No acute osseous abnormality or suspicious osseous lesion. IMPRESSION: No acute abnormality identified in the abdomen or pelvis. Electronically Signed   By: Logan Bores M.D.   On: 09/03/2018 07:07   [place] Art/ven Flow Abd Pelv Doppler  Result Date: 09/03/2018 CLINICAL DATA:  Pelvic pain for several hours EXAM: TRANSABDOMINAL AND TRANSVAGINAL ULTRASOUND OF PELVIS DOPPLER ULTRASOUND OF OVARIES TECHNIQUE: Both transabdominal and transvaginal ultrasound examinations of the pelvis were performed. Transabdominal technique was performed for global imaging of the pelvis including uterus, ovaries, adnexal regions, and pelvic cul-de-sac. It was necessary to proceed with endovaginal exam following the transabdominal exam to visualize the ovaries. Color and duplex Doppler ultrasound was utilized to evaluate blood flow to the ovaries. COMPARISON:  CT from earlier in the same day. FINDINGS: Uterus Measurements: 6.7 x 2.7 x 4.3 cm. = volume: 40 mL. No fibroids or other mass visualized. Endometrium Thickness: 2.2 mm.  IUD is noted in place. Right ovary Measurements: 2.4 x 1.7 x 2.2 cm. = volume: 5 mL. Normal appearance/no adnexal mass. Left ovary Measurements: 2.3 x 1.7 x 1.1 cm. = volume: 2.2 mL. Normal appearance/no adnexal mass. Pulsed Doppler evaluation of both ovaries  demonstrates normal low-resistance arterial and venous waveforms. Other findings No abnormal free fluid. IMPRESSION: Unremarkable pelvic ultrasound. IUD in place similar to that seen on recent CT. Electronically Signed   By: Inez Catalina M.D.   On: 09/03/2018 08:38    MDM    Clinical Course as of Sep 03 1104  Mon Sep 03, 2018  0720 Discussed imaging results with patient. States her pain is now 3/10.    [SJ]    Clinical Course User Index [SJ] Layla Maw   Took patient care handoff report from [facility], [place]. Plan: CT of the abdomen/pelvis is pending.  Review and decide on further management.  Patient presents with abdominal pain beginning last night. Patient is nontoxic appearing, afebrile, not tachycardic, not tachypneic, not hypotensive, maintains excellent SPO2 on room air, and is in no apparent distress.  No acute abnormalities on CT.  She continued to have some abdominal  tenderness on my exam and therefore ultrasound was ordered and pelvic exam was performed. No acute abnormalities on ultrasound.  Patient appeared in no distress and seemed rather comfortable throughout my time with her. We will follow-up with PCP. Return precautions discussed. Patient voices understanding of these instructions, accepts the plan, and is comfortable with discharge.  Findings and plan of care discussed with Ezequiel Essex, MD. Dr. Wyvonnia Dusky personally evaluated and examined this patient.   Vitals:   09/03/18 0426 09/03/18 0530 09/03/18 0546 09/03/18 0600  BP: 111/73 (!) 99/49  (!) 100/48  Pulse: 87 76  88  Resp: 14 (!) 23  (!) 21  Temp: 99.9 F (37.7 C)     TempSrc: Oral     SpO2: 99% 100%  100%  Weight:   99.8 kg   Height:   5\' 9"  (1.753 m)        Layla Maw 09/03/18 1109    Rancour, Annie Main, MD 09/04/18 [phone]

## 2018-09-03 NOTE — Discharge Instructions (Signed)
Nausea and abdominal pain  Hand washing: Wash your hands throughout the day, but especially before and after touching the face, using the restroom, sneezing, coughing, or touching surfaces that have been coughed or sneezed upon. Hydration: Symptoms will be intensified and complicated by dehydration. Dehydration can also extend the duration of symptoms. Drink plenty of fluids and get plenty of rest. You should be drinking at least half a liter of water an hour to stay hydrated. Electrolyte drinks (ex. Gatorade, Powerade, Pedialyte) are also encouraged. You should be drinking enough fluids to make your urine light yellow, almost clear. If this is not the case, you are not drinking enough water. Please note that some of the treatments indicated below will not be effective if you are not adequately hydrated. Diet: Please concentrate on hydration, however, you may introduce food slowly.  Start with a clear liquid diet, progressed to a full liquid diet, and then bland solids as you are able. Pain or fever: Ibuprofen, Naproxen, or Tylenol for pain or fever.  Antiinflammatory medications: Take 600 mg of ibuprofen every 6 hours or 440 mg (over the counter dose) to 500 mg (prescription dose) of naproxen every 12 hours for the next 3 days. After this time, these medications may be used as needed for pain. Take these medications with food to avoid upset stomach. Choose only one of these medications, do not take them together. Acetaminophen (generic for Tylenol): Should you continue to have additional pain while taking the ibuprofen or naproxen, you may add in acetaminophen as needed. Your daily total maximum amount of acetaminophen from all sources should be limited to 4000mg /day for persons without liver problems, or 2000mg /day for those with liver problems. Vicodin: May take Vicodin (hydrocodone-acetaminophen) as needed for severe pain.  Do not drive or perform other dangerous activities while taking the Vicodin.   Please note that each pill of Vicodin contains 325 mg of acetaminophen (Tylenol) and the above dosage limits apply. Nausea/vomiting: Use the Zofran for nausea or vomiting. Bentyl: This medication is what is known as an antispasmodic and is intended to help reduce abdominal discomfort. Follow-up: Follow-up with a primary care provider on this matter. Return: Return should you develop a fever, bloody diarrhea, increased abdominal pain, uncontrolled vomiting, or any other major concerns.  For prescription assistance, may try using prescription discount sites or apps, such as goodrx.com

## 2018-09-03 NOTE — ED Provider Notes (Signed)
[facility] DEPT Provider Note   CSN: [id number] Arrival date & time: 09/03/18  0417     History   Chief Complaint Chief Complaint  Patient presents with  . Abdominal Pain    cancer pt  . Chills    HPI Adrienne [name] is a 28 y.o. female.  28 year old female with a history of Hodgkin's lymphoma (last Doxyrubicin, Vinblastine, Dacarbezine chemo infusion on the 10th) presents to the emergency department for evaluation of lower domino pain.  She states that pain began suddenly at [employer].  It has remained constant.  It waxes and wanes in severity.  When pain is more severe, she describes it as sharp, needlelike pains.  It does not radiate, specifically not to her back.  She has had some associated nausea.  Tried using MiraLAX prior to arrival, but no other medications taken.  She has had 3 bowel movements today.  Continues to pass flatus.  No fevers, vomiting, diarrhea, dysuria, hematuria, vaginal bleeding, vaginal discharge. Reports hx of kidney stones in her father.  No history of abdominal surgeries.  The history is provided by the patient. No language interpreter was used.  Abdominal Pain    Past Medical History:  Diagnosis Date  . Anxiety 2019  . Depression 2016  . Dyspnea 01/2009   with sternal area discomfort, Pt questions anxiety  . Frequent UTI   . Neutropenic fever ([place]) 04/18/2018    Patient Active Problem List   Diagnosis Date Noted  . Rash 06/08/2018  . Hot flashes 05/10/2018  . Skin infection 05/10/2018  . Encounter for antineoplastic chemotherapy 05/02/2018  . 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 Preventive measure 05/02/2018  . Acute sinusitis 04/18/2018  . Chemotherapy induced neutropenia ([place]) 04/06/2018  . Bone pain 04/06/2018  . Chemotherapy-induced nausea 03/16/2018  . 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 Mucositis due to antineoplastic therapy 03/16/2018  . Other constipation  03/16/2018  . 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 Encounter for fertility preservation counseling prior to cancer therapy 02/23/2018  . Other fatigue 02/23/2018  . Anxiety 08/08/2017    Past Surgical History:  Procedure Laterality Date  . INDUCED ABORTION    . LYMPH NODE BIOPSY Right 02/15/2018   Procedure: RIGHT CERVICAL LYMPH NODE EXCISIONAL BIOPSY;  Surgeon: Erroll Luna, MD;  Location: [facility];  Service: General;  Laterality: Right;  . MOUTH SURGERY    . PORTACATH PLACEMENT Left 03/07/2018   Procedure: INSERTION PORT-A-CATH WITH ULTRASOUND;  Surgeon: Erroll Luna, MD;  Location: [facility];  Service: General;  Laterality: Left;  . WRIST SURGERY       OB History    Gravida  2   Para  1   Term  1   Preterm      AB  1   Living  1     SAB      TAB  1   Ectopic      Multiple  0   Live Births  1            Home Medications    Prior to Admission medications   Medication Sig Start Date End Date Taking? Authorizing Provider  FLUoxetine (PROZAC) 20 MG capsule Take 1 capsule (20 mg total) by mouth daily. 07/06/18  Yes Curcio, Roselie Awkward, NP  levonorgestrel (MIRENA, 52 MG,) 20 MCG/24HR IUD 1 each by Intrauterine route once.    Yes [provider]  lidocaine-prilocaine (EMLA)  cream Apply 1 application topically as needed. Patient taking differently: Apply 1 application topically as needed (pain).  03/06/18  Yes Gorsuch, Ni, MD  LORazepam (ATIVAN) 1 MG tablet Take 1 tablet (1 mg total) by mouth every 8 (eight) hours as needed for anxiety or sleep (nausea). 08/03/18  Yes Gorsuch, Ni, MD  ondansetron (ZOFRAN) 8 MG tablet Take 1 tablet (8 mg total) by mouth every 8 (eight) hours as needed. Start on the third day after chemotherapy. Patient not taking: Reported on 06/08/2018 03/06/18   Heath Lark, MD  prochlorperazine (COMPAZINE) 10 MG tablet Take 1 tablet (10 mg total) by mouth every 6 (six) hours as needed (Nausea or  vomiting). Patient not taking: Reported on 06/08/2018 03/06/18   Heath Lark, MD  traMADol (ULTRAM) 50 MG tablet Take 1 tablet (50 mg total) by mouth every 6 (six) hours as needed. Patient not taking: Reported on 09/03/2018 07/20/18   Heath Lark, MD    Family History Family History  Problem Relation Age of Onset  . Cancer Paternal Grandmother        lung  . Heart disease Paternal Grandfather        died from heart attack  . Alcohol abuse Neg Hx   . Arthritis Neg Hx   . Asthma Neg Hx   . Birth defects Neg Hx   . COPD Neg Hx   . Depression Neg Hx   . Diabetes Neg Hx   . Drug abuse Neg Hx   . Early death Neg Hx   . Hearing loss Neg Hx   . Hyperlipidemia Neg Hx   . Hypertension Neg Hx   . Kidney disease Neg Hx   . Learning disabilities Neg Hx   . Mental illness Neg Hx   . Mental retardation Neg Hx   . Miscarriages / Stillbirths Neg Hx   . Stroke Neg Hx   . Vision loss Neg Hx   . Varicose Veins Neg Hx     Social History Social History   Tobacco Use  . Smoking status: Former Research scientist (life sciences)  . Smokeless tobacco: Never Used  . Tobacco comment: 2015  Substance Use Topics  . Alcohol use: Yes    Alcohol/week: 0.0 standard drinks    Comment: socially   . Drug use: No    Comment: Hx of drug abuse- per pt in previous ED notes     Allergies   Patient has no known allergies.   Review of Systems Review of Systems  Gastrointestinal: Positive for abdominal pain.  Ten systems reviewed and are negative for acute change, except as noted in the HPI.    Physical Exam Updated Vital Signs BP (!) 100/48   Pulse 88   Temp 99.9 F (37.7 C) (Oral)   Resp (!) 21   Ht 5\' 9"  (1.753 m)   Wt 99.8 kg   SpO2 100%   BMI 32.49 kg/m   Physical Exam Vitals signs and nursing note reviewed.  Constitutional:      General: She is not in acute distress.    Appearance: She is well-developed. She is not diaphoretic.     Comments: Nontoxic appearing and in NAD  HENT:     Head: Normocephalic and  atraumatic.  Eyes:     General: No scleral icterus.    Conjunctiva/sclera: Conjunctivae normal.  Neck:     Musculoskeletal: Normal range of motion.  Cardiovascular:     Rate and Rhythm: Normal rate and regular rhythm.     Pulses:  Normal pulses.  Pulmonary:     Effort: Pulmonary effort is normal. No respiratory distress.     Breath sounds: No stridor. No wheezing or rales.     Comments: Port to left upper chest. Lungs CTAB. Abdominal:     Palpations: There is no mass.     Tenderness: There is abdominal tenderness. There is no guarding.     Hernia: No hernia is present.     Comments: TTP in the RLQ. Mild TTP also noted in the LUQ and LLQ. Abdomen soft, nondistended. No peritoneal signs.  Musculoskeletal: Normal range of motion.  Skin:    General: Skin is warm and dry.     Coloration: Skin is not pale.     Findings: No erythema or rash.  Neurological:     General: No focal deficit present.     Mental Status: She is alert and oriented to person, place, and time.     Coordination: Coordination normal.  Psychiatric:        Behavior: Behavior normal.      ED Treatments / Results  Labs (all labs ordered are listed, but only abnormal results are displayed) Labs Reviewed  COMPREHENSIVE METABOLIC PANEL - Abnormal; Notable for the following components:      Result Value   CO2 21 (*)    Glucose, Bld 102 (*)    All other components within normal limits  CBC - Abnormal; Notable for the following components:   WBC 10.6 (*)    Hemoglobin 11.7 (*)    All other components within normal limits  URINALYSIS, ROUTINE W REFLEX MICROSCOPIC - Abnormal; Notable for the following components:   Color, Urine STRAW (*)    Hgb urine dipstick SMALL (*)    Bacteria, UA RARE (*)    All other components within normal limits  CULTURE, BLOOD (ROUTINE X 2)  CULTURE, BLOOD (ROUTINE X 2)  URINE CULTURE  LIPASE, BLOOD  LACTIC ACID, PLASMA  LACTIC ACID, PLASMA  DIFFERENTIAL  I-STAT BETA HCG BLOOD, ED  ([facility], [facility], [facility] ONLY)    EKG None  Radiology No results found.  Procedures Procedures (including critical care time)  Medications Ordered in ED Medications  sodium chloride flush (NS) 0.9 % injection 3 mL (has no administration in time range)  sodium chloride 0.9 % bolus 1,000 mL (1,000 mLs Intravenous New Bag/Given 09/03/18 0552)  iopamidol (ISOVUE-300) 61 % injection (has no administration in time range)  sodium chloride (PF) 0.9 % injection (has no administration in time range)  HYDROmorphone (DILAUDID) injection 1 mg (has no administration in time range)  sterile water (preservative free) injection (has no administration in time range)  morphine 4 MG/ML injection 4 mg (4 mg Intravenous Given 09/03/18 0552)  ondansetron (ZOFRAN) injection 4 mg (4 mg Intravenous Given 09/03/18 0552)  alteplase (CATHFLO ACTIVASE) injection 2 mg (2 mg Intracatheter Given 09/03/18 0623)    6:28 AM On chart review, systolic blood pressure ranges from 94-105 on average.   Initial Impression / Assessment and Plan / ED Course  I have reviewed the triage vital signs and the nursing notes.  Pertinent labs & imaging results that were available during my care of the patient were reviewed by me and considered in my medical decision making (see chart for details).     28 year old female with a history of Hodgkin's lymphoma with last chemo infusion on 08/17/2018 presents to the emergency department for sudden onset lower abdominal pain.  She has had some nausea, but no vomiting.  Denies any fevers prior to arrival.    Maximum temperature has been 99.2F.  Her blood pressure is at baseline compared to prior inpatient and outpatient assessments.  No present criteria for SIRS or sepsis.  Her initial laboratory evaluation is reassuring.  Patient pending CT scan to evaluate because of pain.  Care signed out to [facility], PA-C at change of shift he will follow-up on imaging and disposition appropriately.   Final  Clinical Impressions(s) / ED Diagnoses   Final diagnoses:  Abdominal pain, unspecified abdominal location    ED Discharge Orders    None       Antonietta Breach, PA-C 09/03/18 1540    Ezequiel Essex, MD 09/03/18 [phone]

## 2018-09-03 NOTE — ED Notes (Signed)
Per IV nurse: Pts port is accessed, flushes and pulls back blood.

## 2018-09-03 NOTE — ED Notes (Signed)
Attempted to obtain blood sample from IV. Unable to draw back off pts IV. NT to attempt to obtain blood sample.

## 2018-09-03 NOTE — ED Triage Notes (Addendum)
Pt reports "splitting lower abdominal pain" that started around 8p. She states that later, she started developing chills and feeling hot. Endorses nausea with no vomiting. Pt just finished her last chemo for non- Hodgkins lymphoma. Denies fever at home, low grade fever in triage.

## 2018-09-04 ENCOUNTER — Telehealth: Payer: Self-pay

## 2018-09-04 LAB — URINE CULTURE: Culture: <10000 — AB

## 2018-09-04 LAB — GC/CHLAMYDIA PROBE AMP (~~LOC~~) NOT AT ARMC
CHLAMYDIA, DNA PROBE: NEGATIVE
Neisseria Gonorrhea: NEGATIVE

## 2018-09-04 NOTE — Telephone Encounter (Signed)
She called and left a message. She went to the ER yesterday for extreme lower abdominal pain. She had labs, CT scan and pelvic exam. Her pain is better today. Can she cancel labs and flush scheduled for 2/10 with PET scan? Her port was flushed yesterday.

## 2018-09-04 NOTE — Telephone Encounter (Signed)
Called back and given below message. She verbalized understanding. Lab and flush canceled. She will keep PET as ordered.

## 2018-09-04 NOTE — Telephone Encounter (Signed)
The CT was abdomen and pelvis Her original cancer was neck and upper chest Either we cancel the PET and do CT chest and neck instead or keep the PET OK to cancel labs Let me know what she decides

## 2018-09-05 ENCOUNTER — Telehealth: Payer: Self-pay | Admitting: *Deleted

## 2018-09-05 NOTE — Telephone Encounter (Signed)
Patient called asking how long after treatment should she wait to start taking vitamins. She wants to start taking a Hair, Skin and Nail complex including Biotin and Vitamin E.

## 2018-09-06 NOTE — Telephone Encounter (Signed)
That would be fine 

## 2018-09-06 NOTE — Telephone Encounter (Signed)
Called and given below message. She verbalized understanding. 

## 2018-09-08 LAB — CULTURE, BLOOD (ROUTINE X 2)
CULTURE: NO GROWTH
Culture: NO GROWTH
Special Requests: ADEQUATE
Special Requests: ADEQUATE

## 2018-09-17 ENCOUNTER — Ambulatory Visit (HOSPITAL_COMMUNITY)
Admission: RE | Admit: 2018-09-17 | Discharge: 2018-09-17 | Disposition: A | Discharge status: Home / Self Care | Payer: 59 | Source: Ambulatory Visit | Attending: Hematology and Oncology | Admitting: Hematology and Oncology

## 2018-09-17 ENCOUNTER — Ambulatory Visit
Admission: EM | Admit: 2018-09-17 | Discharge: 2018-09-17 | Disposition: A | Discharge status: Home / Self Care | Payer: PRIVATE HEALTH INSURANCE | Attending: Family Medicine | Admitting: Family Medicine

## 2018-09-17 ENCOUNTER — Other Ambulatory Visit: Payer: 59

## 2018-09-17 DIAGNOSIS — R05 Cough: Secondary | ICD-10-CM | POA: Diagnosis not present

## 2018-09-17 DIAGNOSIS — C8170 Other classical Hodgkin lymphoma, unspecified site: Secondary | ICD-10-CM | POA: Diagnosis not present

## 2018-09-17 DIAGNOSIS — J3489 Other specified disorders of nose and nasal sinuses: Secondary | ICD-10-CM | POA: Diagnosis not present

## 2018-09-17 DIAGNOSIS — R059 Cough, unspecified: Secondary | ICD-10-CM

## 2018-09-17 LAB — GLUCOSE, CAPILLARY: Glucose-Capillary: 86 mg/dL (ref 70–99)

## 2018-09-17 LAB — POCT INFLUENZA A/B
Influenza A, POC: NEGATIVE
Influenza B, POC: NEGATIVE

## 2018-09-17 MED ORDER — BENZONATATE 200 MG PO CAPS: 200.0000 mg | ORAL_CAPSULE | Freq: Two times a day (BID) | ORAL | 0 refills | Status: DC | PRN

## 2018-09-17 MED ORDER — DM-GUAIFENESIN ER 30-600 MG PO TB12: 1.0000 | ORAL_TABLET | Freq: Two times a day (BID) | ORAL | 0 refills | Status: DC

## 2018-09-17 MED ORDER — FLUDEOXYGLUCOSE F - 18 (FDG) INJECTION
10.9900 | Freq: Once | INTRAVENOUS | Status: AC | PRN
  Administered 2018-09-17: 10.99 via INTRAVENOUS

## 2018-09-17 NOTE — Discharge Instructions (Addendum)
Rest Push fluids Take both tessalon and mucinex DM twice a day Use a humidifier if you have one  Flu test is negative

## 2018-09-17 NOTE — ED Triage Notes (Signed)
Pt c/o cough, sore throat and runny nose x2days, states exposed to flu.

## 2018-09-17 NOTE — ED Provider Notes (Signed)
[facility]    CSN: [id number] Arrival date & time: 09/17/18  1002     History   Chief Complaint Chief Complaint  Patient presents with  . Cough    HPI Adrienne [name] is a 28 y.o. female.   HPI  Patient is here for cough.  She has been sick for 2 days.  She has some cough and runny nose.  No fever or chills.  No body aches.  She is concerned because she has been exposed to 3 family members directly that have influenza.  She does not want to give influenza to her 59-year-old child.  She is otherwise healthy.  Mirena for birth control.  He has some chest pain anteriorly from all the coughing.  No pain with deep breath.  No sputum production.  No underlying lung disease  Past Medical History:  Diagnosis Date  . Anxiety 2019  . Depression 2016  . Dyspnea 01/2009   with sternal area discomfort, Pt questions anxiety  . Frequent UTI   . Hodgkin's lymphoma ([place]) 02/21/2018  . Neutropenic fever ([place]) 04/18/2018    Patient Active Problem List   Diagnosis Date Noted  . Rash 06/08/2018  . Hot flashes 05/10/2018  . Skin infection 05/10/2018  . Encounter for antineoplastic chemotherapy 05/02/2018  . 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 Preventive measure 05/02/2018  . Acute sinusitis 04/18/2018  . Chemotherapy induced neutropenia ([place]) 04/06/2018  . Bone pain 04/06/2018  . Chemotherapy-induced nausea 03/16/2018  . 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([place]) 03/16/2018  . Mucositis due to antineoplastic therapy 03/16/2018  . Other constipation 03/16/2018  . 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 Encounter for fertility preservation counseling prior to cancer therapy 02/23/2018  . Other fatigue 02/23/2018  . Anxiety 08/08/2017    Past Surgical History:  Procedure Laterality Date  . INDUCED ABORTION    . LYMPH NODE BIOPSY Right 02/15/2018   Procedure: RIGHT CERVICAL LYMPH NODE EXCISIONAL BIOPSY;  Surgeon: Erroll Luna, MD;  Location: [facility];  Service: General;  Laterality: Right;  . MOUTH SURGERY    . PORTACATH PLACEMENT Left 03/07/2018   Procedure: INSERTION PORT-A-CATH WITH ULTRASOUND;  Surgeon: Erroll Luna, MD;  Location: [facility];  Service: General;  Laterality: Left;  . WRIST SURGERY      OB History    Gravida  2   Para  1   Term  1   Preterm      AB  1   Living  1     SAB      TAB  1   Ectopic      Multiple  0   Live Births  1            Home Medications    Prior to Admission medications   Medication Sig Start Date End Date Taking? Authorizing Provider  benzonatate (TESSALON) 200 MG capsule Take 1 capsule (200 mg total) by mouth 2 (two) times daily as needed for cough. 09/17/18   Raylene Everts, MD  dextromethorphan-guaiFENesin [facility] DM) 30-600 MG 12hr tablet Take 1 tablet by mouth 2 (two) times daily. 09/17/18   Raylene Everts, MD  FLUoxetine (PROZAC) 20 MG capsule Take 1 capsule (20 mg total) by mouth daily. 07/06/18   Maryanna Shape, NP  levonorgestrel (MIRENA, 52 MG,) 20 MCG/24HR IUD 1 each by Intrauterine route once.     [provider]    Family History Family History  Problem Relation Age of Onset  . Cancer Paternal Grandmother        lung  . Heart disease Paternal Grandfather        died from heart attack  . Alcohol abuse Neg Hx   . Arthritis Neg Hx   . Asthma Neg Hx   . Birth defects Neg Hx   . COPD Neg Hx   . Depression Neg Hx   . Diabetes Neg Hx   . Drug abuse Neg Hx   . Early death Neg Hx   . Hearing loss Neg Hx   . Hyperlipidemia Neg Hx   . Hypertension Neg Hx   . Kidney disease Neg Hx   . Learning disabilities Neg Hx   . Mental illness Neg Hx   . Mental retardation Neg Hx   . Miscarriages / Stillbirths Neg Hx   . Stroke Neg Hx   . Vision loss Neg Hx   . Varicose Veins Neg Hx     Social History Social History   Tobacco Use  . Smoking status: Former Research scientist (life sciences)  . Smokeless tobacco: Never Used  . Tobacco  comment: 2015  Substance Use Topics  . Alcohol use: Yes    Alcohol/week: 0.0 standard drinks    Comment: socially   . Drug use: No    Comment: Hx of drug abuse- per pt in previous ED notes     Allergies   Patient has no known allergies.   Review of Systems Review of Systems  Constitutional: Negative for chills and fever.  HENT: Positive for rhinorrhea. Negative for ear pain and sore throat.   Eyes: Negative for pain and visual disturbance.  Respiratory: Positive for cough. Negative for shortness of breath.   Cardiovascular: Negative for chest pain and palpitations.  Gastrointestinal: Negative for abdominal pain and vomiting.  Genitourinary: Negative for dysuria and hematuria.  Musculoskeletal: Negative for arthralgias and back pain.  Skin: Negative for color change and rash.  Neurological: Negative for seizures and syncope.  All other systems reviewed and are negative.    Physical Exam Triage Vital Signs ED Triage Vitals  Enc Vitals Group     BP 09/17/18 1012 107/70     Pulse Rate 09/17/18 1012 88     Resp 09/17/18 1012 18     Temp 09/17/18 1012 98.6 F (37 C)     Temp Source 09/17/18 1012 Oral     SpO2 09/17/18 1012 99 %     Weight --      Height --      Head Circumference --      Peak Flow --      Pain Score 09/17/18 1013 0     Pain Loc --      Pain Edu? --      Excl. in [place]? --    No data found.  Updated Vital Signs BP 107/70 (BP Location: Left Arm)   Pulse 88   Temp 98.6 F (37 C) (Oral)   Resp 18   SpO2 99%   Visual Acuity Right Eye Distance:   Left Eye Distance:   Bilateral Distance:    Right Eye Near:   Left Eye Near:    Bilateral Near:     Physical Exam Constitutional:      General: She is not in acute distress.    Appearance: Normal appearance. She is well-developed.  HENT:     Head: Normocephalic and atraumatic.     Right Ear: Tympanic membrane, ear canal and  external ear normal.     Left Ear: Tympanic membrane and ear canal normal.      Nose: Congestion and rhinorrhea present.     Mouth/Throat:     Mouth: Mucous membranes are moist.     Pharynx: No posterior oropharyngeal erythema.  Eyes:     Conjunctiva/sclera: Conjunctivae normal.     Pupils: Pupils are equal, round, and reactive to light.  Neck:     Musculoskeletal: Normal range of motion.  Cardiovascular:     Rate and Rhythm: Normal rate and regular rhythm.     Heart sounds: Normal heart sounds.  Pulmonary:     Effort: Pulmonary effort is normal. No respiratory distress.     Breath sounds: Normal breath sounds.  Abdominal:     General: There is no distension.     Palpations: Abdomen is soft.  Musculoskeletal: Normal range of motion.  Skin:    General: Skin is warm and dry.  Neurological:     Mental Status: She is alert.  Psychiatric:        Mood and Affect: Mood normal.        Thought Content: Thought content normal.      UC Treatments / Results  Labs (all labs ordered are listed, but only abnormal results are displayed) Labs Reviewed  POCT INFLUENZA A/B - Normal    EKG None  Radiology   Procedures Procedures (including critical care time)  Medications Ordered in UC Medications - No data to display  Initial Impression / Assessment and Plan / UC Course  I have reviewed the triage vital signs and the nursing notes.  Pertinent labs & imaging results that were available during my care of the patient were reviewed by me and considered in my medical decision making (see chart for details).     Flu test is negative.  We discussed that this is a viral bronchitis.  Symptomatic treatment recommended. Final Clinical Impressions(s) / UC Diagnoses   Final diagnoses:  Cough     Discharge Instructions     Rest Push fluids Take both tessalon and mucinex DM twice a day Use a humidifier if you have one  Flu test is negative   ED Prescriptions    Medication Sig Dispense Auth. Provider   benzonatate (TESSALON) 200 MG capsule Take 1  capsule (200 mg total) by mouth 2 (two) times daily as needed for cough. 20 capsule Raylene Everts, MD   dextromethorphan-guaiFENesin [facility] DM) 30-600 MG 12hr tablet Take 1 tablet by mouth 2 (two) times daily. 20 tablet Raylene Everts, MD     Controlled Substance Prescriptions [place] Controlled Substance Registry consulted? Not Applicable   Raylene Everts, MD 09/17/18 1049

## 2018-09-18 ENCOUNTER — Encounter: Payer: Self-pay | Admitting: Hematology and Oncology

## 2018-09-18 ENCOUNTER — Telehealth: Payer: Self-pay

## 2018-09-18 ENCOUNTER — Telehealth: Payer: Self-pay | Admitting: Hematology and Oncology

## 2018-09-18 ENCOUNTER — Inpatient Hospital Stay: Payer: 59 | Attending: Hematology and Oncology | Admitting: Hematology and Oncology

## 2018-09-18 DIAGNOSIS — N390 Urinary tract infection, site not specified: Secondary | ICD-10-CM | POA: Diagnosis not present

## 2018-09-18 DIAGNOSIS — N951 Menopausal and female climacteric states: Secondary | ICD-10-CM | POA: Insufficient documentation

## 2018-09-18 DIAGNOSIS — Z793 Long term (current) use of hormonal contraceptives: Secondary | ICD-10-CM | POA: Diagnosis not present

## 2018-09-18 DIAGNOSIS — R232 Flushing: Secondary | ICD-10-CM

## 2018-09-18 DIAGNOSIS — Z975 Presence of (intrauterine) contraceptive device: Secondary | ICD-10-CM | POA: Diagnosis not present

## 2018-09-18 DIAGNOSIS — Z79899 Other long term (current) drug therapy: Secondary | ICD-10-CM | POA: Diagnosis not present

## 2018-09-18 DIAGNOSIS — C8170 Other classical Hodgkin lymphoma, unspecified site: Secondary | ICD-10-CM | POA: Diagnosis not present

## 2018-09-18 DIAGNOSIS — C8191 Hodgkin lymphoma, unspecified, lymph nodes of head, face, and neck: Secondary | ICD-10-CM | POA: Diagnosis present

## 2018-09-18 NOTE — Telephone Encounter (Signed)
Called and given message to his assistant. They will call her and give her a office appt.

## 2018-09-18 NOTE — Telephone Encounter (Signed)
Gave avs and calendar ° °

## 2018-09-18 NOTE — Assessment & Plan Note (Addendum)
I have reviewed multiple PET CT scan with the patient and her husband The patient has achieved complete response I will call her surgeon to get her port removed Per guidelines, I plan to see her every 3 months for the first year.  We will repeat another imaging study in 6 months. We discussed signs and symptoms to watch out for. We discussed dietary modification and weight loss strategy as she has gained a lot of weight during chemo

## 2018-09-18 NOTE — Assessment & Plan Note (Signed)
She has persistent symptoms of URI We discussed over-the-counter, conservative management.

## 2018-09-18 NOTE — Progress Notes (Signed)
[facility] OFFICE PROGRESS NOTE  Patient Care Team: [name], MD as PCP - General (Family Medicine)  ASSESSMENT & PLAN:  Classical Hodgkin lymphoma [facility]) I have reviewed multiple PET CT scan with the patient and her husband The patient has achieved complete response I will call her surgeon to get her port removed Per guidelines, I plan to see her every 3 months for the first year.  We will repeat another imaging study in 6 months. We discussed signs and symptoms to watch out for. We discussed dietary modification and weight loss strategy as she has gained a lot of weight during chemo  Upper urinary tract infection She has persistent symptoms of URI We discussed over-the-counter, conservative management.  Hot flashes She has treatment-induced premature menopause which I suspect will improve upon discontinuation of chemotherapy and Lupron.   No orders of the defined types were placed in this encounter.   INTERVAL HISTORY: Please see below for problem oriented charting. She returns with her husband for further follow-up She was recently hospitalized for severe abdominal pain and had extensive evaluation recently for URI She still have congestive symptoms but overall improving.  No fever or chills. Abdominal pain has resolved She has no menstruation for the last few months No new lymphadenopathy  SUMMARY OF ONCOLOGIC HISTORY:   Classical Hodgkin lymphoma ([place])   01/22/2018 Imaging    CT scan of neck: Adenopathy throughout the right lateral neck, right supraclavicular fossa, and upper mediastinum. There is no specific imaging feature, lymphoma or atypical infection are the primary considerations and excisional biopsy should be considered if there is no diagnosis by history or labs.     01/31/2018 Pathology Results    Lymph node for lymphoma, right supraclavicular / right cervical - ATYPICAL LYMPHOID PROLIFERATION. - SEE COMMENT. Microscopic  Comment Sections show a few very small needle core biopsy fragments of lymph nodal tissue displaying a polymorphous cellular proliferation of small lymphocytes, plasma cells, eosinophils and histiocytes in addition to variable numbers of large mononuclear and multilobated lymphoid appearing cells with variably prominent nucleoli. Flow cytometric was performed ([id number]) and failed to show any monoclonal B-cell population or abnormal T-cell phenotype. Immunohistochemical stains were performed, including CD15, CD20, CD3, CD30, LCA, and PAX-5 with appropriate controls. The large atypical lymphoid appearing cells are positive for CD15, CD30, CD20 and PAX-5 and negative for CD3 and LCA. The small lymphoid component in the background is mostly composed of T cells. The morphologic and phenotypic features are atypical and highly suspicious for involvement by a lymphoproliferative process, particularly classical Hodgkin lymphoma. Excisional biopsy is recommended.    01/31/2018 Procedure    Ultrasound-guided core biopsies of right cervical lymph nodes.    02/15/2018 Pathology Results    Lymph node for lymphoma, Right Cervical - CLASSICAL HODGKIN LYMPHOMA, NODULAR SCLEROSIS TYPE    03/05/2018 PET scan    1. Hypermetabolic RIGHT level 2 lymph nodes, supraclavicular lymph nodes, sub pectoralis lymph nodes and RIGHT axillary lymph nodes. 2. Hypermetabolic mediastinal lymph nodes. 3. No evidence of lymphoma beneath the diaphragms. Normal spleen. 4. Normal bone marrow.    03/05/2018 Imaging    LV EF: 55% -  60%    03/06/2018 Cancer Staging    Staging form: Hodgkin and Non-Hodgkin Lymphoma, AJCC 8th Edition - Clinical stage from 03/06/2018: Stage II (Hodgkin lymphoma, B - Symptoms) - Signed by Heath Lark, MD on 03/06/2018    03/08/2018 - 08/17/2018 Chemotherapy    The patient had ABVD for chemotherapy treatment. After  interim scan showed complete response, Bleomycin was discontinued     05/01/2018 PET scan    1.  Near complete resolution of metabolic activity within RIGHT cervical lymph nodes, mediastinal nodes, RIGHT supraclavicular nodes, and axillary nodes ( Deauville 1 and 2). 2. No evidence lymphoma progression. 3. Normal spleen and marrow. 4. Extensive hypermetabolic brown fat within the neck and thorax makes evaluation challenging.    06/05/2018 Echocardiogram    LV EF: 60% -  65%    09/03/2018 Imaging    No acute abnormality identified in the abdomen or pelvis.    09/17/2018 PET scan    1. Faintly accentuated thymic activity with maximum SUV 4.3, which is at the Deauville 4 level. However, this may simply represent rebound or response to therapy, there is no masslike appearance in the anterior mediastinum. 2. Accentuated bilateral palatine tonsillar activity without CT correlate, probably physiologic/incidental. 3. Otherwise no accentuated activity or pathologic enlarged adenopathy in the neck, chest, abdomen, or pelvis to suggest active disease. This would correspond to Deauville 1 response. 4. Chronic ethmoid and bilateral maxillary sinusitis.      REVIEW OF SYSTEMS:   Constitutional: Denies fevers, chills or abnormal weight loss Eyes: Denies blurriness of vision Ears, nose, mouth, throat, and face: Denies mucositis or sore throat Respiratory: Denies cough, dyspnea or wheezes Cardiovascular: Denies palpitation, chest discomfort or lower extremity swelling Gastrointestinal:  Denies nausea, heartburn or change in bowel habits Skin: Denies abnormal skin rashes Lymphatics: Denies new lymphadenopathy or easy bruising Neurological:Denies numbness, tingling or new weaknesses Behavioral/Psych: Mood is stable, no new changes  All other systems were reviewed with the patient and are negative.  I have reviewed the past medical history, past surgical history, social history and family history with the patient and they are unchanged from previous note.  ALLERGIES:  has No Known  Allergies.  MEDICATIONS:  Current Outpatient Medications  Medication Sig Dispense Refill  . benzonatate (TESSALON) 200 MG capsule Take 1 capsule (200 mg total) by mouth 2 (two) times daily as needed for cough. 20 capsule 0  . dextromethorphan-guaiFENesin (MUCINEX DM) 30-600 MG 12hr tablet Take 1 tablet by mouth 2 (two) times daily. 20 tablet 0  . FLUoxetine (PROZAC) 20 MG capsule Take 1 capsule (20 mg total) by mouth daily. 30 capsule 2  . levonorgestrel (MIRENA, 52 MG,) 20 MCG/24HR IUD 1 each by Intrauterine route once.      No current facility-administered medications for this visit.     PHYSICAL EXAMINATION: ECOG PERFORMANCE STATUS: 1 - Symptomatic but completely ambulatory  Vitals:   09/18/18 0917  BP: 103/67  Pulse: 72  Resp: 18  Temp: 97.8 F (36.6 C)  SpO2: 99%   Filed Weights   09/18/18 0917  Weight: 225 lb 12.8 oz (102.4 kg)    GENERAL:alert, no distress and comfortable Musculoskeletal:no cyanosis of digits and no clubbing  NEURO: alert & oriented x 3 with fluent speech, no focal motor/sensory deficits  LABORATORY DATA:  I have reviewed the data as listed    Component Value Date/Time   NA 136 09/03/2018 0505   K 3.9 09/03/2018 0505   CL 106 09/03/2018 0505   CO2 21 (L) 09/03/2018 0505   GLUCOSE 102 (H) 09/03/2018 0505   BUN 19 09/03/2018 0505   CREATININE 0.68 09/03/2018 0505   CREATININE 0.76 03/26/2018 1147   CALCIUM 9.1 09/03/2018 0505   PROT 6.7 09/03/2018 0505   ALBUMIN 3.9 09/03/2018 0505   AST 23 09/03/2018 0505   AST 14 (  L) 03/26/2018 1147   ALT 28 09/03/2018 0505   ALT 24 03/26/2018 1147   ALKPHOS 51 09/03/2018 0505   BILITOT 0.6 09/03/2018 0505   BILITOT 1.0 03/26/2018 1147   GFRNONAA >60 09/03/2018 0505   GFRNONAA >60 03/26/2018 1147   GFRAA >60 09/03/2018 0505   GFRAA >60 03/26/2018 1147    No results found for: SPEP, UPEP  Lab Results  Component Value Date   WBC 10.6 (H) 09/03/2018   NEUTROABS 8.0 (H) 09/03/2018   HGB 11.7 (L)  09/03/2018   HCT 36.1 09/03/2018   MCV 90.7 09/03/2018   PLT 216 09/03/2018      Chemistry      Component Value Date/Time   NA 136 09/03/2018 0505   K 3.9 09/03/2018 0505   CL 106 09/03/2018 0505   CO2 21 (L) 09/03/2018 0505   BUN 19 09/03/2018 0505   CREATININE 0.68 09/03/2018 0505   CREATININE 0.76 03/26/2018 1147      Component Value Date/Time   CALCIUM 9.1 09/03/2018 0505   ALKPHOS 51 09/03/2018 0505   AST 23 09/03/2018 0505   AST 14 (L) 03/26/2018 1147   ALT 28 09/03/2018 0505   ALT 24 03/26/2018 1147   BILITOT 0.6 09/03/2018 0505   BILITOT 1.0 03/26/2018 1147       RADIOGRAPHIC STUDIES: I have personally reviewed the radiological images as listed and agreed with the findings in the report. US Transvaginal Non-ob  Result Date: 09/03/2018 CLINICAL DATA:  Pelvic pain for several hours EXAM: TRANSABDOMINAL AND TRANSVAGINAL ULTRASOUND OF PELVIS DOPPLER ULTRASOUND OF OVARIES TECHNIQUE: Both transabdominal and transvaginal ultrasound examinations of the pelvis were performed. Transabdominal technique was performed for global imaging of the pelvis including uterus, ovaries, adnexal regions, and pelvic cul-de-sac. It was necessary to proceed with endovaginal exam following the transabdominal exam to visualize the ovaries. Color and duplex Doppler ultrasound was utilized to evaluate blood flow to the ovaries. COMPARISON:  CT from earlier in the same day. FINDINGS: Uterus Measurements: 6.7 x 2.7 x 4.3 cm. = volume: 40 mL. No fibroids or other mass visualized. Endometrium Thickness: 2.2 mm.  IUD is noted in place. Right ovary Measurements: 2.4 x 1.7 x 2.2 cm. = volume: 5 mL. Normal appearance/no adnexal mass. Left ovary Measurements: 2.3 x 1.7 x 1.1 cm. = volume: 2.2 mL. Normal appearance/no adnexal mass. Pulsed Doppler evaluation of both ovaries demonstrates normal low-resistance arterial and venous waveforms. Other findings No abnormal free fluid. IMPRESSION: Unremarkable pelvic  ultrasound. IUD in place similar to that seen on recent CT. Electronically Signed   By: Inez Catalina M.D.   On: 09/03/2018 08:38   US Pelvis Complete  Result Date: 09/03/2018 CLINICAL DATA:  Pelvic pain for several hours EXAM: TRANSABDOMINAL AND TRANSVAGINAL ULTRASOUND OF PELVIS DOPPLER ULTRASOUND OF OVARIES TECHNIQUE: Both transabdominal and transvaginal ultrasound examinations of the pelvis were performed. Transabdominal technique was performed for global imaging of the pelvis including uterus, ovaries, adnexal regions, and pelvic cul-de-sac. It was necessary to proceed with endovaginal exam following the transabdominal exam to visualize the ovaries. Color and duplex Doppler ultrasound was utilized to evaluate blood flow to the ovaries. COMPARISON:  CT from earlier in the same day. FINDINGS: Uterus Measurements: 6.7 x 2.7 x 4.3 cm. = volume: 40 mL. No fibroids or other mass visualized. Endometrium Thickness: 2.2 mm.  IUD is noted in place. Right ovary Measurements: 2.4 x 1.7 x 2.2 cm. = volume: 5 mL. Normal appearance/no adnexal mass. Left ovary Measurements: 2.3 x  1.7 x 1.1 cm. = volume: 2.2 mL. Normal appearance/no adnexal mass. Pulsed Doppler evaluation of both ovaries demonstrates normal low-resistance arterial and venous waveforms. Other findings No abnormal free fluid. IMPRESSION: Unremarkable pelvic ultrasound. IUD in place similar to that seen on recent CT. Electronically Signed   By: Inez Catalina M.D.   On: 09/03/2018 08:38   Ct Abdomen Pelvis W Contrast  Result Date: 09/03/2018 CLINICAL DATA:  Lower abdominal pain. History of Hodgkin's lymphoma with recently completed chemotherapy. EXAM: CT ABDOMEN AND PELVIS WITH CONTRAST TECHNIQUE: Multidetector CT imaging of the abdomen and pelvis was performed using the standard protocol following bolus administration of intravenous contrast. CONTRAST:  151mL ISOVUE-300 IOPAMIDOL (ISOVUE-300) INJECTION 61% COMPARISON:  PET-CT 05/01/2018 FINDINGS: Lower chest:  No consolidation or pleural effusion in the lung bases. Hepatobiliary: No focal liver abnormality is seen. No gallstones, gallbladder wall thickening, or biliary dilatation. Pancreas: Unremarkable. Spleen: Unremarkable. Adrenals/Urinary Tract: Unremarkable adrenal glands. No evidence of renal mass, calculi, or hydronephrosis. Unremarkable bladder. Stomach/Bowel: The stomach is unremarkable. There is no evidence of bowel obstruction or inflammation. The appendix is unremarkable. Vascular/Lymphatic: Normal caliber of the abdominal aorta. Scattered subcentimeter para-aortic and right lower quadrant lymph nodes, similar to the prior PET-CT. Reproductive: IUD in place. Unremarkable ovaries. Other: No intraperitoneal free fluid. Musculoskeletal: No acute osseous abnormality or suspicious osseous lesion. IMPRESSION: No acute abnormality identified in the abdomen or pelvis. Electronically Signed   By: Logan Bores M.D.   On: 09/03/2018 07:07   Nm Pet Image Restag (ps) Skull Base To Thigh  Result Date: 09/17/2018 CLINICAL DATA:  Subsequent treatment strategy for Hodgkin's lymphoma. EXAM: NUCLEAR MEDICINE PET SKULL BASE TO THIGH TECHNIQUE: 11.0 mCi F-18 FDG was injected intravenously. Full-ring PET imaging was performed from the skull base to thigh after the radiotracer. CT data was obtained and used for attenuation correction and anatomic localization. Fasting blood glucose: A5 mg/dl COMPARISON:  05/01/2018 FINDINGS: Mediastinal blood pool activity: SUV max 2.4 Background hepatic activity: SUV max 4.0 NECK: Accentuated bilateral palatine tonsillar activity, maximum SUV 10.2 in the right and 11.6 on the left. There is some continued mild accentuation of submandibular gland activity which is symmetric and, much like the tonsillar activity, probably physiologic. Likewise glottic activity and lingual activity do not have a CT correlate and are likely physiologic. No recurrent hypermetabolic nodal activity in the neck,  previous activity has resolved (Deauville 1). Incidental CT findings: Chronic ethmoid and bilateral maxillary sinusitis. CHEST: Mild anterior mediastinal soft tissue density likely represents thymus and has maximum SUV of 4.3. This does not appear bulky and I am skeptical that it represents active lymphoma. No recurrent pathologically enlarged or hypermetabolic adenopathy. Incidental CT findings: Left Port-A-Cath tip: SVC. ABDOMEN/PELVIS: No significant abnormal hypermetabolic activity in this region. Incidental CT findings: No splenomegaly. IUD noted along the endometrium. SKELETON: No significant abnormal hypermetabolic activity in this region. Incidental CT findings: none IMPRESSION: 1. Faintly accentuated thymic activity with maximum SUV 4.3, which is at the Deauville 4 level. However, this may simply represent rebound or response to therapy, there is no masslike appearance in the anterior mediastinum. 2. Accentuated bilateral palatine tonsillar activity without CT correlate, probably physiologic/incidental. 3. Otherwise no accentuated activity or pathologic enlarged adenopathy in the neck, chest, abdomen, or pelvis to suggest active disease. This would correspond to Deauville 1 response. 4. Chronic ethmoid and bilateral maxillary sinusitis. Electronically Signed   By: Van Clines M.D.   On: 09/17/2018 10:08   [place] Art/ven Flow Abd Pelv Doppler  Result Date: 09/03/2018 CLINICAL DATA:  Pelvic pain for several hours EXAM: TRANSABDOMINAL AND TRANSVAGINAL ULTRASOUND OF PELVIS DOPPLER ULTRASOUND OF OVARIES TECHNIQUE: Both transabdominal and transvaginal ultrasound examinations of the pelvis were performed. Transabdominal technique was performed for global imaging of the pelvis including uterus, ovaries, adnexal regions, and pelvic cul-de-sac. It was necessary to proceed with endovaginal exam following the transabdominal exam to visualize the ovaries. Color and duplex Doppler ultrasound was utilized to  evaluate blood flow to the ovaries. COMPARISON:  CT from earlier in the same day. FINDINGS: Uterus Measurements: 6.7 x 2.7 x 4.3 cm. = volume: 40 mL. No fibroids or other mass visualized. Endometrium Thickness: 2.2 mm.  IUD is noted in place. Right ovary Measurements: 2.4 x 1.7 x 2.2 cm. = volume: 5 mL. Normal appearance/no adnexal mass. Left ovary Measurements: 2.3 x 1.7 x 1.1 cm. = volume: 2.2 mL. Normal appearance/no adnexal mass. Pulsed Doppler evaluation of both ovaries demonstrates normal low-resistance arterial and venous waveforms. Other findings No abnormal free fluid. IMPRESSION: Unremarkable pelvic ultrasound. IUD in place similar to that seen on recent CT. Electronically Signed   By: Inez Catalina M.D.   On: 09/03/2018 08:38    All questions were answered. The patient knows to call the clinic with any problems, questions or concerns. No barriers to learning was detected.  I spent 15 minutes counseling the patient face to face. The total time spent in the appointment was 20 minutes and more than 50% was on counseling and review of test results  Heath Lark, MD 09/18/2018 3:38 PM

## 2018-09-18 NOTE — Assessment & Plan Note (Signed)
She has treatment-induced premature menopause which I suspect will improve upon discontinuation of chemotherapy and Lupron.

## 2018-09-18 NOTE — Telephone Encounter (Signed)
-----   Message from Heath Lark, MD sent at 09/18/2018  3:39 PM EST ----- Regarding: call general surgery Dr. Brantley Stage Pls call his office and let him know to schedule port removal She has finished chemo

## 2018-09-25 ENCOUNTER — Telehealth: Payer: Self-pay | Admitting: Hematology and Oncology

## 2018-09-25 ENCOUNTER — Encounter: Payer: Self-pay | Admitting: Hematology and Oncology

## 2018-09-25 NOTE — Telephone Encounter (Signed)
I have reviewed the PET scan results with the patient over the phone again and reassured her that the minor changes noted by the radiologist does not represent lymphoma recurrence.  She is reassured

## 2018-10-09 ENCOUNTER — Telehealth: Payer: Self-pay

## 2018-10-09 ENCOUNTER — Other Ambulatory Visit: Payer: Self-pay | Admitting: Hematology and Oncology

## 2018-10-09 NOTE — Telephone Encounter (Signed)
Sent to pharmacy 

## 2018-10-09 NOTE — Telephone Encounter (Signed)
Called regarding Lorazepam refill request. She is fine with Lorazepam Rx qhs only.

## 2018-11-01 ENCOUNTER — Telehealth: Payer: Self-pay

## 2018-11-01 NOTE — Telephone Encounter (Signed)
Pt called and asked if she needed to have port flushed between now and it being taken out in June.  Per Dr Alvy Bimler no need to flush since no longer being used.  Called pt and gave her this information.  She verbalizes understanding.  No further needs at this time.

## 2018-12-17 ENCOUNTER — Inpatient Hospital Stay: Payer: 59

## 2018-12-17 ENCOUNTER — Telehealth: Payer: Self-pay

## 2018-12-17 ENCOUNTER — Inpatient Hospital Stay: Payer: 59 | Attending: Hematology and Oncology | Admitting: Hematology and Oncology

## 2018-12-17 NOTE — Telephone Encounter (Signed)
Attempted to call regarding today's appt. She is late for this mornings appt. Mail box is full and unable to leave message.

## 2018-12-18 ENCOUNTER — Telehealth: Payer: Self-pay | Admitting: Hematology and Oncology

## 2018-12-18 NOTE — Telephone Encounter (Signed)
Rescheduled missed appt per sch msg. Called patient. Mailbox full and could not leave msg.

## 2018-12-26 ENCOUNTER — Telehealth: Payer: Self-pay

## 2018-12-26 NOTE — Telephone Encounter (Signed)
-----   Message from Heath Lark, MD sent at 12/26/2018 12:49 PM EDT ----- Regarding: RE: IUD removal She is finished with chemo and cancer free Please call GYN to have her IUD removed ----- Message ----- From: Paulla Dolly, RN Sent: 12/26/2018  12:48 PM EDT To: Heath Lark, MD Subject: IUD removal                                    Pt called and said she is wanting to try to get pregnant but GYN will not agree to remove IUD unless you feel that it is OK

## 2018-12-26 NOTE — Telephone Encounter (Signed)
No entry 

## 2018-12-26 NOTE — Telephone Encounter (Signed)
Spoke with pt and her OBGYN, Dr Jerelyn Charles, at [place] by phone and gave below msg.

## 2019-01-01 ENCOUNTER — Ambulatory Visit: Payer: PRIVATE HEALTH INSURANCE | Admitting: Hematology and Oncology

## 2019-01-01 ENCOUNTER — Other Ambulatory Visit: Payer: PRIVATE HEALTH INSURANCE

## 2019-01-03 ENCOUNTER — Inpatient Hospital Stay: Payer: 59 | Admitting: Hematology and Oncology

## 2019-01-03 ENCOUNTER — Inpatient Hospital Stay: Payer: 59

## 2019-01-03 ENCOUNTER — Telehealth: Payer: Self-pay

## 2019-01-03 NOTE — Telephone Encounter (Signed)
-----   Message from Heath Lark, MD sent at 01/03/2019 10:51 AM EDT ----- Regarding: multiple no shows 2nd no show today She is Adrienne [name] niece Please call her and ask how what she wants?

## 2019-01-03 NOTE — Telephone Encounter (Signed)
Called with below message. She verbalized understanding. She apologized about missing the appt. Recent death in the family and another family member in the hospital. Appt rescheduled to 6/4, times given and she verbalized understanding.

## 2019-01-10 ENCOUNTER — Encounter: Payer: Self-pay | Admitting: Hematology and Oncology

## 2019-01-10 ENCOUNTER — Other Ambulatory Visit: Payer: Self-pay

## 2019-01-10 ENCOUNTER — Telehealth: Payer: Self-pay | Admitting: Hematology and Oncology

## 2019-01-10 ENCOUNTER — Inpatient Hospital Stay: Payer: 59 | Attending: Hematology and Oncology | Admitting: Hematology and Oncology

## 2019-01-10 ENCOUNTER — Inpatient Hospital Stay: Payer: 59

## 2019-01-10 VITALS — BP 103/68 | HR 61 | Temp 98.6°F | Resp 18 | Ht 69.0 in | Wt 230.3 lb

## 2019-01-10 DIAGNOSIS — J320 Chronic maxillary sinusitis: Secondary | ICD-10-CM | POA: Insufficient documentation

## 2019-01-10 DIAGNOSIS — Z79899 Other long term (current) drug therapy: Secondary | ICD-10-CM | POA: Diagnosis not present

## 2019-01-10 DIAGNOSIS — Z9221 Personal history of antineoplastic chemotherapy: Secondary | ICD-10-CM | POA: Diagnosis not present

## 2019-01-10 DIAGNOSIS — C8198 Hodgkin lymphoma, unspecified, lymph nodes of multiple sites: Secondary | ICD-10-CM | POA: Insufficient documentation

## 2019-01-10 DIAGNOSIS — C8170 Other classical Hodgkin lymphoma, unspecified site: Secondary | ICD-10-CM

## 2019-01-10 LAB — CBC WITH DIFFERENTIAL/PLATELET
Abs Immature Granulocytes: 0.01 10*3/uL (ref 0.00–0.07)
Basophils Absolute: 0 10*3/uL (ref 0.0–0.1)
Basophils Relative: 1 %
Eosinophils Absolute: 0.5 10*3/uL (ref 0.0–0.5)
Eosinophils Relative: 9 %
HCT: 38.4 % (ref 36.0–46.0)
Hemoglobin: 12.4 g/dL (ref 12.0–15.0)
Immature Granulocytes: 0 %
Lymphocytes Relative: 23 %
Lymphs Abs: 1.3 10*3/uL (ref 0.7–4.0)
MCH: 29.2 pg (ref 26.0–34.0)
MCHC: 32.3 g/dL (ref 30.0–36.0)
MCV: 90.6 fL (ref 80.0–100.0)
Monocytes Absolute: 0.4 10*3/uL (ref 0.1–1.0)
Monocytes Relative: 8 %
Neutro Abs: 3.4 10*3/uL (ref 1.7–7.7)
Neutrophils Relative %: 59 %
Platelets: 217 10*3/uL (ref 150–400)
RBC: 4.24 MIL/uL (ref 3.87–5.11)
RDW: 12.3 % (ref 11.5–15.5)
WBC: 5.6 10*3/uL (ref 4.0–10.5)
nRBC: 0 % (ref 0.0–0.2)

## 2019-01-10 LAB — COMPREHENSIVE METABOLIC PANEL
ALT: 20 U/L (ref 0–44)
AST: 19 U/L (ref 15–41)
Albumin: 3.9 g/dL (ref 3.5–5.0)
Alkaline Phosphatase: 77 U/L (ref 38–126)
Anion gap: 8 (ref 5–15)
BUN: 14 mg/dL (ref 6–20)
CO2: 23 mmol/L (ref 22–32)
Calcium: 9.2 mg/dL (ref 8.9–10.3)
Chloride: 107 mmol/L (ref 98–111)
Creatinine, Ser: 0.9 mg/dL (ref 0.44–1.00)
GFR calc Af Amer: >60 mL/min (ref >60)
GFR calc non Af Amer: >60 mL/min (ref >60)
Glucose, Bld: 115 mg/dL — ABNORMAL HIGH (ref 70–99)
Potassium: 3.9 mmol/L (ref 3.5–5.1)
Sodium: 138 mmol/L (ref 135–145)
Total Bilirubin: 0.7 mg/dL (ref 0.3–1.2)
Total Protein: 6.9 g/dL (ref 6.5–8.1)

## 2019-01-10 NOTE — Assessment & Plan Note (Signed)
Clinically, she have no signs or symptoms of cancer recurrence Per guidelines, I will see her back in 3 months with repeat CT imaging and blood work The patient is educated to watch out for signs and symptoms of cancer recurrence I checked her wound after recent port extraction Her wound looks clean I placed new bandages over it.

## 2019-01-10 NOTE — Progress Notes (Signed)
[place] OFFICE PROGRESS NOTE  Patient Care Team: [name], MD as PCP - General (Family Medicine)  ASSESSMENT & PLAN:  Classical Hodgkin lymphoma [facility]) Clinically, she have no signs or symptoms of cancer recurrence Per guidelines, I will see her back in 3 months with repeat CT imaging and blood work The patient is educated to watch out for signs and symptoms of cancer recurrence I checked her wound after recent port extraction Her wound looks clean I placed new bandages over it.   Orders Placed This Encounter  Procedures  . CT CHEST W CONTRAST    Standing Status:   Future    Standing Expiration Date:   01/10/2020    Order Specific Question:   If indicated for the ordered procedure, I authorize the administration of contrast media per Radiology protocol    Answer:   Yes    Order Specific Question:   Preferred imaging location?    Answer:   [facility]    Order Specific Question:   Radiology Contrast Protocol - do NOT remove file path    Answer:   [email or web address]    Order Specific Question:   Is patient pregnant?    Answer:   No  . CBC with Differential/Platelet    Standing Status:   Future    Standing Expiration Date:   02/14/2020  . Comprehensive metabolic panel    Standing Status:   Future    Standing Expiration Date:   02/14/2020  . Pregnancy, urine    Standing Status:   Future    Standing Expiration Date:   01/10/2020    INTERVAL HISTORY: Please see below for problem oriented charting. She returns for further follow-up She has missed several appointments recently She feels well Her menstruation has returned back to normal She has no new lymphadenopathy No recent infection, fever or chills Her port was removed yesterday She has some persistent pain at the port extraction site  SUMMARY OF ONCOLOGIC HISTORY:   Classical Hodgkin lymphoma ([place])   01/22/2018 Imaging    CT scan of neck: Adenopathy throughout the  right lateral neck, right supraclavicular fossa, and upper mediastinum. There is no specific imaging feature, lymphoma or atypical infection are the primary considerations and excisional biopsy should be considered if there is no diagnosis by history or labs.     01/31/2018 Pathology Results    Lymph node for lymphoma, right supraclavicular / right cervical - ATYPICAL LYMPHOID PROLIFERATION. - SEE COMMENT. Microscopic Comment Sections show a few very small needle core biopsy fragments of lymph nodal tissue displaying a polymorphous cellular proliferation of small lymphocytes, plasma cells, eosinophils and histiocytes in addition to variable numbers of large mononuclear and multilobated lymphoid appearing cells with variably prominent nucleoli. Flow cytometric was performed ([id number]) and failed to show any monoclonal B-cell population or abnormal T-cell phenotype. Immunohistochemical stains were performed, including CD15, CD20, CD3, CD30, LCA, and PAX-5 with appropriate controls. The large atypical lymphoid appearing cells are positive for CD15, CD30, CD20 and PAX-5 and negative for CD3 and LCA. The small lymphoid component in the background is mostly composed of T cells. The morphologic and phenotypic features are atypical and highly suspicious for involvement by a lymphoproliferative process, particularly classical Hodgkin lymphoma. Excisional biopsy is recommended.    01/31/2018 Procedure    Ultrasound-guided core biopsies of right cervical lymph nodes.    02/15/2018 Pathology Results    Lymph node for lymphoma, Right Cervical - CLASSICAL HODGKIN LYMPHOMA, NODULAR SCLEROSIS TYPE  03/05/2018 PET scan    1. Hypermetabolic RIGHT level 2 lymph nodes, supraclavicular lymph nodes, sub pectoralis lymph nodes and RIGHT axillary lymph nodes. 2. Hypermetabolic mediastinal lymph nodes. 3. No evidence of lymphoma beneath the diaphragms. Normal spleen. 4. Normal bone marrow.    03/05/2018 Imaging    LV  EF: 55% -  60%    03/06/2018 Cancer Staging    Staging form: Hodgkin and Non-Hodgkin Lymphoma, AJCC 8th Edition - Clinical stage from 03/06/2018: Stage II (Hodgkin lymphoma, B - Symptoms) - Signed by Heath Lark, MD on 03/06/2018    03/08/2018 - 08/17/2018 Chemotherapy    The patient had ABVD for chemotherapy treatment. After interim scan showed complete response, Bleomycin was discontinued     05/01/2018 PET scan    1. Near complete resolution of metabolic activity within RIGHT cervical lymph nodes, mediastinal nodes, RIGHT supraclavicular nodes, and axillary nodes ( Deauville 1 and 2). 2. No evidence lymphoma progression. 3. Normal spleen and marrow. 4. Extensive hypermetabolic brown fat within the neck and thorax makes evaluation challenging.    06/05/2018 Echocardiogram    LV EF: 60% -  65%    09/03/2018 Imaging    No acute abnormality identified in the abdomen or pelvis.    09/17/2018 PET scan    1. Faintly accentuated thymic activity with maximum SUV 4.3, which is at the Deauville 4 level. However, this may simply represent rebound or response to therapy, there is no masslike appearance in the anterior mediastinum. 2. Accentuated bilateral palatine tonsillar activity without CT correlate, probably physiologic/incidental. 3. Otherwise no accentuated activity or pathologic enlarged adenopathy in the neck, chest, abdomen, or pelvis to suggest active disease. This would correspond to Deauville 1 response. 4. Chronic ethmoid and bilateral maxillary sinusitis.      REVIEW OF SYSTEMS:   Constitutional: Denies fevers, chills or abnormal weight loss Eyes: Denies blurriness of vision Ears, nose, mouth, throat, and face: Denies mucositis or sore throat Respiratory: Denies cough, dyspnea or wheezes Cardiovascular: Denies palpitation, chest discomfort or lower extremity swelling Gastrointestinal:  Denies nausea, heartburn or change in bowel habits Skin: Denies abnormal skin  rashes Lymphatics: Denies new lymphadenopathy or easy bruising Neurological:Denies numbness, tingling or new weaknesses Behavioral/Psych: Mood is stable, no new changes  All other systems were reviewed with the patient and are negative.  I have reviewed the past medical history, past surgical history, social history and family history with the patient and they are unchanged from previous note.  ALLERGIES:  has No Known Allergies.  MEDICATIONS:  Current Outpatient Medications  Medication Sig Dispense Refill  . benzonatate (TESSALON) 200 MG capsule Take 1 capsule (200 mg total) by mouth 2 (two) times daily as needed for cough. 20 capsule 0  . dextromethorphan-guaiFENesin (MUCINEX DM) 30-600 MG 12hr tablet Take 1 tablet by mouth 2 (two) times daily. 20 tablet 0  . FLUoxetine (PROZAC) 20 MG capsule Take 1 capsule (20 mg total) by mouth daily. 30 capsule 2  . levonorgestrel (MIRENA, 52 MG,) 20 MCG/24HR IUD 1 each by Intrauterine route once.     Marland Kitchen LORazepam (ATIVAN) 1 MG tablet Take 1 tablet (1 mg total) by mouth at bedtime. 60 tablet 0   No current facility-administered medications for this visit.     PHYSICAL EXAMINATION: ECOG PERFORMANCE STATUS: 0 - Asymptomatic  Vitals:   01/10/19 1058  BP: 103/68  Pulse: 61  Resp: 18  Temp: 98.6 F (37 C)  SpO2: 100%   Filed Weights   01/10/19 1058  Weight: 230 lb 4.8 oz (104.5 kg)    GENERAL:alert, no distress and comfortable SKIN: skin color, texture, turgor are normal, no rashes or significant lesions EYES: normal, Conjunctiva are pink and non-injected, sclera clear OROPHARYNX:no exudate, no erythema and lips, buccal mucosa, and tongue normal  NECK: supple, thyroid normal size, non-tender, without nodularity LYMPH:  no palpable lymphadenopathy in the cervical, axillary or inguinal LUNGS: clear to auscultation and percussion with normal breathing effort HEART: regular rate & rhythm and no murmurs and no lower extremity  edema ABDOMEN:abdomen soft, non-tender and normal bowel sounds Musculoskeletal:no cyanosis of digits and no clubbing.  The port site looks clean.  New bandages are applied. NEURO: alert & oriented x 3 with fluent speech, no focal motor/sensory deficits  LABORATORY DATA:  I have reviewed the data as listed    Component Value Date/Time   NA 138 01/10/2019 1030   K 3.9 01/10/2019 1030   CL 107 01/10/2019 1030   CO2 23 01/10/2019 1030   GLUCOSE 115 (H) 01/10/2019 1030   BUN 14 01/10/2019 1030   CREATININE 0.90 01/10/2019 1030   CREATININE 0.76 03/26/2018 1147   CALCIUM 9.2 01/10/2019 1030   PROT 6.9 01/10/2019 1030   ALBUMIN 3.9 01/10/2019 1030   AST 19 01/10/2019 1030   AST 14 (L) 03/26/2018 1147   ALT 20 01/10/2019 1030   ALT 24 03/26/2018 1147   ALKPHOS 77 01/10/2019 1030   BILITOT 0.7 01/10/2019 1030   BILITOT 1.0 03/26/2018 1147   GFRNONAA >60 01/10/2019 1030   GFRNONAA >60 03/26/2018 1147   GFRAA >60 01/10/2019 1030   GFRAA >60 03/26/2018 1147    No results found for: SPEP, UPEP  Lab Results  Component Value Date   WBC 5.6 01/10/2019   NEUTROABS 3.4 01/10/2019   HGB 12.4 01/10/2019   HCT 38.4 01/10/2019   MCV 90.6 01/10/2019   PLT 217 01/10/2019      Chemistry      Component Value Date/Time   NA 138 01/10/2019 1030   K 3.9 01/10/2019 1030   CL 107 01/10/2019 1030   CO2 23 01/10/2019 1030   BUN 14 01/10/2019 1030   CREATININE 0.90 01/10/2019 1030   CREATININE 0.76 03/26/2018 1147      Component Value Date/Time   CALCIUM 9.2 01/10/2019 1030   ALKPHOS 77 01/10/2019 1030   AST 19 01/10/2019 1030   AST 14 (L) 03/26/2018 1147   ALT 20 01/10/2019 1030   ALT 24 03/26/2018 1147   BILITOT 0.7 01/10/2019 1030   BILITOT 1.0 03/26/2018 1147       All questions were answered. The patient knows to call the clinic with any problems, questions or concerns. No barriers to learning was detected.  I spent 15 minutes counseling the patient face to face. The total  time spent in the appointment was 20 minutes and more than 50% was on counseling and review of test results  Heath Lark, MD 01/10/2019 1:21 PM

## 2019-01-10 NOTE — Telephone Encounter (Signed)
Scheduled appt per sch msg. Mailed printout  °

## 2019-04-11 ENCOUNTER — Inpatient Hospital Stay: Payer: 59 | Attending: Hematology and Oncology

## 2019-04-11 ENCOUNTER — Ambulatory Visit (HOSPITAL_COMMUNITY)
Admission: RE | Admit: 2019-04-11 | Discharge: 2019-04-11 | Disposition: A | Discharge status: Home / Self Care | Payer: 59 | Source: Ambulatory Visit | Attending: Hematology and Oncology | Admitting: Hematology and Oncology

## 2019-04-11 ENCOUNTER — Other Ambulatory Visit: Payer: Self-pay

## 2019-04-11 DIAGNOSIS — C8170 Other classical Hodgkin lymphoma, unspecified site: Secondary | ICD-10-CM

## 2019-04-11 DIAGNOSIS — Z79899 Other long term (current) drug therapy: Secondary | ICD-10-CM | POA: Insufficient documentation

## 2019-04-11 DIAGNOSIS — Z793 Long term (current) use of hormonal contraceptives: Secondary | ICD-10-CM | POA: Insufficient documentation

## 2019-04-11 DIAGNOSIS — C8198 Hodgkin lymphoma, unspecified, lymph nodes of multiple sites: Secondary | ICD-10-CM | POA: Insufficient documentation

## 2019-04-11 LAB — CBC WITH DIFFERENTIAL/PLATELET
Abs Immature Granulocytes: 0.02 10*3/uL (ref 0.00–0.07)
Basophils Absolute: 0.1 10*3/uL (ref 0.0–0.1)
Basophils Relative: 1 %
Eosinophils Absolute: 0.5 10*3/uL (ref 0.0–0.5)
Eosinophils Relative: 7 %
HCT: 44.4 % (ref 36.0–46.0)
Hemoglobin: 14.7 g/dL (ref 12.0–15.0)
Immature Granulocytes: 0 %
Lymphocytes Relative: 22 %
Lymphs Abs: 1.7 10*3/uL (ref 0.7–4.0)
MCH: 30.4 pg (ref 26.0–34.0)
MCHC: 33.1 g/dL (ref 30.0–36.0)
MCV: 91.7 fL (ref 80.0–100.0)
Monocytes Absolute: 0.7 10*3/uL (ref 0.1–1.0)
Monocytes Relative: 9 %
Neutro Abs: 4.8 10*3/uL (ref 1.7–7.7)
Neutrophils Relative %: 61 %
Platelets: 255 10*3/uL (ref 150–400)
RBC: 4.84 MIL/uL (ref 3.87–5.11)
RDW: 12.6 % (ref 11.5–15.5)
WBC: 7.8 10*3/uL (ref 4.0–10.5)
nRBC: 0 % (ref 0.0–0.2)

## 2019-04-11 LAB — PREGNANCY, URINE: Preg Test, Ur: NEGATIVE

## 2019-04-11 LAB — COMPREHENSIVE METABOLIC PANEL
ALT: 15 U/L (ref 0–44)
AST: 16 U/L (ref 15–41)
Albumin: 4.3 g/dL (ref 3.5–5.0)
Alkaline Phosphatase: 70 U/L (ref 38–126)
Anion gap: 9 (ref 5–15)
BUN: 13 mg/dL (ref 6–20)
CO2: 23 mmol/L (ref 22–32)
Calcium: 9.4 mg/dL (ref 8.9–10.3)
Chloride: 104 mmol/L (ref 98–111)
Creatinine, Ser: 0.79 mg/dL (ref 0.44–1.00)
GFR calc Af Amer: >60 mL/min (ref >60)
GFR calc non Af Amer: >60 mL/min (ref >60)
Glucose, Bld: 88 mg/dL (ref 70–99)
Potassium: 3.9 mmol/L (ref 3.5–5.1)
Sodium: 136 mmol/L (ref 135–145)
Total Bilirubin: 0.9 mg/dL (ref 0.3–1.2)
Total Protein: 7.4 g/dL (ref 6.5–8.1)

## 2019-04-11 MED ORDER — IOHEXOL 300 MG/ML  SOLN
75.0000 mL | Freq: Once | INTRAMUSCULAR | Status: AC | PRN
  Administered 2019-04-11: 75 mL via INTRAVENOUS

## 2019-04-12 ENCOUNTER — Other Ambulatory Visit: Payer: Self-pay | Admitting: Hematology and Oncology

## 2019-04-12 ENCOUNTER — Inpatient Hospital Stay: Payer: 59 | Admitting: Hematology and Oncology

## 2019-04-12 ENCOUNTER — Other Ambulatory Visit: Payer: Self-pay

## 2019-04-12 ENCOUNTER — Inpatient Hospital Stay (HOSPITAL_BASED_OUTPATIENT_CLINIC_OR_DEPARTMENT_OTHER): Payer: 59 | Admitting: Hematology and Oncology

## 2019-04-12 ENCOUNTER — Encounter: Payer: Self-pay | Admitting: Hematology and Oncology

## 2019-04-12 ENCOUNTER — Telehealth: Payer: Self-pay

## 2019-04-12 DIAGNOSIS — Z79899 Other long term (current) drug therapy: Secondary | ICD-10-CM

## 2019-04-12 DIAGNOSIS — Z793 Long term (current) use of hormonal contraceptives: Secondary | ICD-10-CM

## 2019-04-12 DIAGNOSIS — C8170 Other classical Hodgkin lymphoma, unspecified site: Secondary | ICD-10-CM

## 2019-04-12 DIAGNOSIS — C8198 Hodgkin lymphoma, unspecified, lymph nodes of multiple sites: Secondary | ICD-10-CM

## 2019-04-12 NOTE — Telephone Encounter (Signed)
-----   Message from Heath Lark, MD sent at 04/12/2019  9:21 AM EDT ----- Regarding: no show Can you call her? I can see her later today at [phone]

## 2019-04-12 NOTE — Telephone Encounter (Signed)
It is unfortunate but we would have to deliver the news over the phone Ct is not normal I am going to try to get a PET scan scheduled to evaluate further I will put through order and see her after all is completed Please check and see if we can get PET scan scheduled and approved for next Wed and I can see her on Thursday

## 2019-04-12 NOTE — Progress Notes (Signed)
[place] OFFICE PROGRESS NOTE  Patient Care Team: [name], MD as PCP - General (Family Medicine)  ASSESSMENT & PLAN:  Classical Hodgkin lymphoma [facility]) I have reviewed CT imaging with the patient comparing with PET CT scan from before The area in the mediastinal look more nodular I recommend PET CT scan for further evaluation and to exclude cancer recurrence Given her young age, benign thymus hyperplasia could also be a possibility I will see her back once we have PET CT scan results available In the meantime, she has no other signs of cancer recurrence   No orders of the defined types were placed in this encounter.   INTERVAL HISTORY: Please see below for problem oriented charting. She returns today for further follow-up She denies recent infection, fever or chills No new lymphadenopathy She is working She has lost some weight with intentional weight loss She continues to have intermittent menstruation  SUMMARY OF ONCOLOGIC HISTORY: Oncology History  Classical Hodgkin lymphoma ([place])  01/22/2018 Imaging   CT scan of neck: Adenopathy throughout the right lateral neck, right supraclavicular fossa, and upper mediastinum. There is no specific imaging feature, lymphoma or atypical infection are the primary considerations and excisional biopsy should be considered if there is no diagnosis by history or labs.    01/31/2018 Pathology Results   Lymph node for lymphoma, right supraclavicular / right cervical - ATYPICAL LYMPHOID PROLIFERATION. - SEE COMMENT. Microscopic Comment Sections show a few very small needle core biopsy fragments of lymph nodal tissue displaying a polymorphous cellular proliferation of small lymphocytes, plasma cells, eosinophils and histiocytes in addition to variable numbers of large mononuclear and multilobated lymphoid appearing cells with variably prominent nucleoli. Flow cytometric was performed [id number]) and failed to show any  monoclonal B-cell population or abnormal T-cell phenotype. Immunohistochemical stains were performed, including CD15, CD20, CD3, CD30, LCA, and PAX-5 with appropriate controls. The large atypical lymphoid appearing cells are positive for CD15, CD30, CD20 and PAX-5 and negative for CD3 and LCA. The small lymphoid component in the background is mostly composed of T cells. The morphologic and phenotypic features are atypical and highly suspicious for involvement by a lymphoproliferative process, particularly classical Hodgkin lymphoma. Excisional biopsy is recommended.   01/31/2018 Procedure   Ultrasound-guided core biopsies of right cervical lymph nodes.   02/15/2018 Pathology Results   Lymph node for lymphoma, Right Cervical - CLASSICAL HODGKIN LYMPHOMA, NODULAR SCLEROSIS TYPE   03/05/2018 PET scan   1. Hypermetabolic RIGHT level 2 lymph nodes, supraclavicular lymph nodes, sub pectoralis lymph nodes and RIGHT axillary lymph nodes. 2. Hypermetabolic mediastinal lymph nodes. 3. No evidence of lymphoma beneath the diaphragms. Normal spleen. 4. Normal bone marrow.   03/05/2018 Imaging   LV EF: 55% -  60%   03/06/2018 Cancer Staging   Staging form: Hodgkin and Non-Hodgkin Lymphoma, AJCC 8th Edition - Clinical stage from 03/06/2018: Stage II (Hodgkin lymphoma, B - Symptoms) - Signed by Heath Lark, MD on 03/06/2018   03/08/2018 - 08/17/2018 Chemotherapy   The patient had ABVD for chemotherapy treatment. After interim scan showed complete response, Bleomycin was discontinued    05/01/2018 PET scan   1. Near complete resolution of metabolic activity within RIGHT cervical lymph nodes, mediastinal nodes, RIGHT supraclavicular nodes, and axillary nodes ( Deauville 1 and 2). 2. No evidence lymphoma progression. 3. Normal spleen and marrow. 4. Extensive hypermetabolic brown fat within the neck and thorax makes evaluation challenging.   06/05/2018 Echocardiogram   LV EF: 60% -  65%  09/03/2018 Imaging    No acute abnormality identified in the abdomen or pelvis.   09/17/2018 PET scan   1. Faintly accentuated thymic activity with maximum SUV 4.3, which is at the Deauville 4 level. However, this may simply represent rebound or response to therapy, there is no masslike appearance in the anterior mediastinum. 2. Accentuated bilateral palatine tonsillar activity without CT correlate, probably physiologic/incidental. 3. Otherwise no accentuated activity or pathologic enlarged adenopathy in the neck, chest, abdomen, or pelvis to suggest active disease. This would correspond to Deauville 1 response. 4. Chronic ethmoid and bilateral maxillary sinusitis.     04/11/2019 Imaging   Ct scan of chest Since the PET-CT of 09/17/2018, there has been progression of soft tissue in the anterior mediastinum. This may be related to thymic tissue although areas of the soft tissue are more nodular than typically seen for thymic remnant/rebound and close attention is recommended as recurrent lymphoma cannot be excluded. There was some soft tissue in this region on the prior PET-CT that demonstrated low level FDG accumulation. Repeat PET-CT may be warranted to further evaluate.   Stable tiny right lung nodules, most likely benign.     REVIEW OF SYSTEMS:   Constitutional: Denies fevers, chills or abnormal weight loss Eyes: Denies blurriness of vision Ears, nose, mouth, throat, and face: Denies mucositis or sore throat Respiratory: Denies cough, dyspnea or wheezes Cardiovascular: Denies palpitation, chest discomfort or lower extremity swelling Gastrointestinal:  Denies nausea, heartburn or change in bowel habits Skin: Denies abnormal skin rashes Lymphatics: Denies new lymphadenopathy or easy bruising Neurological:Denies numbness, tingling or new weaknesses Behavioral/Psych: Mood is stable, no new changes  All other systems were reviewed with the patient and are negative.  I have reviewed the past medical history, past  surgical history, social history and family history with the patient and they are unchanged from previous note.  ALLERGIES:  has No Known Allergies.  MEDICATIONS:  Current Outpatient Medications  Medication Sig Dispense Refill  . benzonatate (TESSALON) 200 MG capsule Take 1 capsule (200 mg total) by mouth 2 (two) times daily as needed for cough. 20 capsule 0  . dextromethorphan-guaiFENesin (MUCINEX DM) 30-600 MG 12hr tablet Take 1 tablet by mouth 2 (two) times daily. 20 tablet 0  . FLUoxetine (PROZAC) 20 MG capsule Take 1 capsule (20 mg total) by mouth daily. 30 capsule 2  . levonorgestrel (MIRENA, 52 MG,) 20 MCG/24HR IUD 1 each by Intrauterine route once.     Marland Kitchen LORazepam (ATIVAN) 1 MG tablet Take 1 tablet (1 mg total) by mouth at bedtime. 60 tablet 0   No current facility-administered medications for this visit.     PHYSICAL EXAMINATION: ECOG PERFORMANCE STATUS: 0 - Asymptomatic  Vitals:   04/12/19 1216  BP: 124/86  Pulse: 65  Resp: 18  Temp: 98.9 F (37.2 C)  SpO2: 100%   Filed Weights   04/12/19 1216  Weight: 209 lb (94.8 kg)    GENERAL:alert, no distress and comfortable SKIN: skin color, texture, turgor are normal, no rashes or significant lesions EYES: normal, Conjunctiva are pink and non-injected, sclera clear OROPHARYNX:no exudate, no erythema and lips, buccal mucosa, and tongue normal  NECK: supple, thyroid normal size, non-tender, without nodularity LYMPH:  no palpable lymphadenopathy in the cervical, axillary or inguinal LUNGS: clear to auscultation and percussion with normal breathing effort HEART: regular rate & rhythm and no murmurs and no lower extremity edema ABDOMEN:abdomen soft, non-tender and normal bowel sounds Musculoskeletal:no cyanosis of digits and no clubbing  NEURO:  alert & oriented x 3 with fluent speech, no focal motor/sensory deficits  LABORATORY DATA:  I have reviewed the data as listed    Component Value Date/Time   NA 136 04/11/2019 0939    K 3.9 04/11/2019 0939   CL 104 04/11/2019 0939   CO2 23 04/11/2019 0939   GLUCOSE 88 04/11/2019 0939   BUN 13 04/11/2019 0939   CREATININE 0.79 04/11/2019 0939   CREATININE 0.76 03/26/2018 1147   CALCIUM 9.4 04/11/2019 0939   PROT 7.4 04/11/2019 0939   ALBUMIN 4.3 04/11/2019 0939   AST 16 04/11/2019 0939   AST 14 (L) 03/26/2018 1147   ALT 15 04/11/2019 0939   ALT 24 03/26/2018 1147   ALKPHOS 70 04/11/2019 0939   BILITOT 0.9 04/11/2019 0939   BILITOT 1.0 03/26/2018 1147   GFRNONAA >60 04/11/2019 0939   GFRNONAA >60 03/26/2018 1147   GFRAA >60 04/11/2019 0939   GFRAA >60 03/26/2018 1147    No results found for: SPEP, UPEP  Lab Results  Component Value Date   WBC 7.8 04/11/2019   NEUTROABS 4.8 04/11/2019   HGB 14.7 04/11/2019   HCT 44.4 04/11/2019   MCV 91.7 04/11/2019   PLT 255 04/11/2019      Chemistry      Component Value Date/Time   NA 136 04/11/2019 0939   K 3.9 04/11/2019 0939   CL 104 04/11/2019 0939   CO2 23 04/11/2019 0939   BUN 13 04/11/2019 0939   CREATININE 0.79 04/11/2019 0939   CREATININE 0.76 03/26/2018 1147      Component Value Date/Time   CALCIUM 9.4 04/11/2019 0939   ALKPHOS 70 04/11/2019 0939   AST 16 04/11/2019 0939   AST 14 (L) 03/26/2018 1147   ALT 15 04/11/2019 0939   ALT 24 03/26/2018 1147   BILITOT 0.9 04/11/2019 0939   BILITOT 1.0 03/26/2018 1147       RADIOGRAPHIC STUDIES: I have reviewed CT imaging with the patient I have personally reviewed the radiological images as listed and agreed with the findings in the report. Ct Chest W Contrast  Result Date: 04/11/2019 CLINICAL DATA:  Hodgkin's lymphoma EXAM: CT CHEST WITH CONTRAST TECHNIQUE: Multidetector CT imaging of the chest was performed during intravenous contrast administration. CONTRAST:  59mL OMNIPAQUE IOHEXOL 300 MG/ML  SOLN COMPARISON:  PET-CT 09/17/2018 FINDINGS: Cardiovascular: The heart size is normal. No substantial pericardial effusion. No thoracic aortic aneurysm.  Mediastinum/Nodes: Interval increase in soft tissue attenuation in the anterior mediastinum, including a 1.7 x 2.1 cm nodular soft tissue focus on image 42/series 2. Other areas of nodular soft tissue in the anterior mediastinum are well demonstrated on 46/2. Confluent anterior soft tissue is visible on 56/2. This is all progressive in the interval since 09/17/2018. No lymphadenopathy in the paratracheal or subcarinal spaces. No hilar lymphadenopathy. The esophagus has normal imaging features. There is no axillary lymphadenopathy. No evidence for subpectoral lymphadenopathy. Lungs/Pleura: 2 mm right upper lobe nodule on 34/7 is stable in the interval. 2 mm subpleural right middle lobe nodule on 100/7 is unchanged. No new suspicious pulmonary nodule or mass. No focal airspace consolidation. No pleural effusion. Upper Abdomen: Unremarkable. Musculoskeletal: No worrisome lytic or sclerotic osseous abnormality. IMPRESSION: Since the PET-CT of 09/17/2018, there has been progression of soft tissue in the anterior mediastinum. This may be related to thymic tissue although areas of the soft tissue are more nodular than typically seen for thymic remnant/rebound and close attention is recommended as recurrent lymphoma cannot be excluded. There  was some soft tissue in this region on the prior PET-CT that demonstrated low level FDG accumulation. Repeat PET-CT may be warranted to further evaluate. Stable tiny right lung nodules, most likely benign. Electronically Signed   By: Misty Stanley M.D.   On: 04/11/2019 12:01    All questions were answered. The patient knows to call the clinic with any problems, questions or concerns. No barriers to learning was detected.  I spent 15 minutes counseling the patient face to face. The total time spent in the appointment was 20 minutes and more than 50% was on counseling and review of test results  Heath Lark, MD 04/12/2019 3:53 PM

## 2019-04-12 NOTE — Telephone Encounter (Signed)
Called and reminded her of appt today. She said she called and told someone she works on Friday's. Unable to come today. She is available on wednesday's and thursdays's for rescheduling.

## 2019-04-12 NOTE — Assessment & Plan Note (Signed)
I have reviewed CT imaging with the patient comparing with PET CT scan from before The area in the mediastinal look more nodular I recommend PET CT scan for further evaluation and to exclude cancer recurrence Given her young age, benign thymus hyperplasia could also be a possibility I will see her back once we have PET CT scan results available In the meantime, she has no other signs of cancer recurrence

## 2019-04-12 NOTE — Telephone Encounter (Signed)
Called back and offered appt today at 1215 with Dr. Alvy Bimler so she can review scan. She declined. Given below message. She is upset and crying. Scheduled appt with Dr. Alvy Bimler at 1215.

## 2019-04-16 ENCOUNTER — Telehealth: Payer: Self-pay

## 2019-04-16 NOTE — Telephone Encounter (Signed)
She called and left a message. Her PET scan is Friday. Could she have a appt Friday afternoon with you to review PET or your earliest appt?

## 2019-04-16 NOTE — Telephone Encounter (Signed)
Called and given below message. She verbalized understanding. Appt scheduled for 1215 Friday.

## 2019-04-16 NOTE — Telephone Encounter (Signed)
I can see her at 1215

## 2019-04-19 ENCOUNTER — Ambulatory Visit (HOSPITAL_COMMUNITY)
Admission: RE | Admit: 2019-04-19 | Discharge: 2019-04-19 | Disposition: A | Discharge status: Home / Self Care | Payer: PRIVATE HEALTH INSURANCE | Source: Ambulatory Visit | Attending: Hematology and Oncology | Admitting: Hematology and Oncology

## 2019-04-19 ENCOUNTER — Other Ambulatory Visit: Payer: Self-pay

## 2019-04-19 ENCOUNTER — Encounter (HOSPITAL_COMMUNITY): Payer: PRIVATE HEALTH INSURANCE

## 2019-04-19 ENCOUNTER — Encounter: Payer: Self-pay | Admitting: Hematology and Oncology

## 2019-04-19 ENCOUNTER — Ambulatory Visit: Payer: PRIVATE HEALTH INSURANCE | Admitting: Hematology and Oncology

## 2019-04-19 ENCOUNTER — Inpatient Hospital Stay (HOSPITAL_BASED_OUTPATIENT_CLINIC_OR_DEPARTMENT_OTHER): Payer: 59 | Admitting: Hematology and Oncology

## 2019-04-19 DIAGNOSIS — C8170 Other classical Hodgkin lymphoma, unspecified site: Secondary | ICD-10-CM

## 2019-04-19 LAB — GLUCOSE, CAPILLARY: Glucose-Capillary: 79 mg/dL (ref 70–99)

## 2019-04-19 MED ORDER — FLUDEOXYGLUCOSE F - 18 (FDG) INJECTION
10.4600 | Freq: Once | INTRAVENOUS | Status: AC | PRN
  Administered 2019-04-19: 07:00:00 10.46 via INTRAVENOUS

## 2019-04-19 NOTE — Progress Notes (Signed)
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   INTERVAL HISTORY: Please see below for problem oriented charting. She returns to review PET CT scan report She denies new symptoms  SUMMARY OF ONCOLOGIC HISTORY: Oncology History  Classical Hodgkin lymphoma ([place])  01/22/2018 Imaging   CT scan of neck: Adenopathy throughout the right lateral neck, right supraclavicular fossa, and upper mediastinum. There is no specific imaging feature, lymphoma or atypical infection are the primary considerations and excisional biopsy should be considered if there is no diagnosis by history or labs.    01/31/2018 Pathology Results   Lymph node for lymphoma, right supraclavicular / right cervical - ATYPICAL LYMPHOID PROLIFERATION. - SEE COMMENT. Microscopic Comment Sections show a few very small needle core biopsy fragments of lymph nodal tissue displaying a polymorphous cellular proliferation of small lymphocytes, plasma cells, eosinophils and histiocytes in addition to variable numbers of large mononuclear and multilobated lymphoid appearing cells with variably prominent nucleoli. Flow cytometric was performed [id number]) and failed to show any monoclonal B-cell population or abnormal T-cell phenotype. Immunohistochemical stains were performed, including CD15, CD20, CD3, CD30, LCA, and PAX-5 with appropriate controls. The large atypical  lymphoid appearing cells are positive for CD15, CD30, CD20 and PAX-5 and negative for CD3 and LCA. The small lymphoid component in the background is mostly composed of T cells. The morphologic and phenotypic features are atypical and highly suspicious for involvement by a lymphoproliferative process, particularly classical Hodgkin lymphoma. Excisional biopsy is recommended.   01/31/2018 Procedure   Ultrasound-guided core biopsies of right cervical lymph nodes.   02/15/2018 Pathology Results   Lymph node for lymphoma, Right Cervical - CLASSICAL HODGKIN LYMPHOMA, NODULAR SCLEROSIS TYPE   03/05/2018 PET scan   1. Hypermetabolic RIGHT level 2 lymph nodes, supraclavicular lymph nodes, sub pectoralis lymph nodes and RIGHT axillary lymph nodes. 2. Hypermetabolic mediastinal lymph nodes. 3. No evidence of lymphoma beneath the diaphragms. Normal spleen. 4. Normal bone marrow.   03/05/2018 Imaging   LV EF: 55% -  60%   03/06/2018 Cancer Staging   Staging form: Hodgkin and Non-Hodgkin Lymphoma, AJCC 8th Edition - Clinical stage from 03/06/2018: Stage II (Hodgkin lymphoma, B - Symptoms) - Signed by Heath Lark, MD on 03/06/2018   03/08/2018 - 08/17/2018 Chemotherapy   The patient had ABVD for chemotherapy treatment. After interim scan showed complete response, Bleomycin was discontinued    05/01/2018 PET scan   1. Near complete resolution of metabolic activity within RIGHT cervical lymph nodes, mediastinal nodes, RIGHT supraclavicular nodes, and axillary nodes ( Deauville 1 and 2). 2. No evidence lymphoma progression. 3. Normal spleen and marrow. 4. Extensive hypermetabolic brown fat within the neck and thorax makes evaluation challenging.   06/05/2018 Echocardiogram   LV EF: 60% -  65%   09/03/2018 Imaging   No acute abnormality identified in the abdomen or pelvis.   09/17/2018 PET scan   1. Faintly accentuated thymic activity  with maximum SUV 4.3, which is at the Deauville 4 level. However, this  may simply represent rebound or response to therapy, there is no masslike appearance in the anterior mediastinum. 2. Accentuated bilateral palatine tonsillar activity without CT correlate, probably physiologic/incidental. 3. Otherwise no accentuated activity or pathologic enlarged adenopathy in the neck, chest, abdomen, or pelvis to suggest active disease. This would correspond to Deauville 1 response. 4. Chronic ethmoid and bilateral maxillary sinusitis.     04/11/2019 Imaging   Ct scan of chest Since the PET-CT of 09/17/2018, there has been progression of soft tissue in the anterior mediastinum. This may be related to thymic tissue although areas of the soft tissue are more nodular than typically seen for thymic remnant/rebound and close attention is recommended as recurrent lymphoma cannot be excluded. There was some soft tissue in this region on the prior PET-CT that demonstrated low level FDG accumulation. Repeat PET-CT may be warranted to further evaluate.   Stable tiny right lung nodules, most likely benign.   04/19/2019 PET scan   IMPRESSION: 1. Interval increase in triangular-shaped area of increased soft tissue within the anterior mediastinum with corresponding FDG uptake of 5.28. 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 Recurrent lymphoma is less favored. 2. Small soft tissue nodule which appears separate from the thymic gland within the superior mediastinum/prevascular region with SUV max of 4.13. Previously 1.61. Although technically borderline Deauville criteria 4, this is only minimally above liver activity. Suggest close interval follow-up.     REVIEW OF SYSTEMS:   Constitutional: Denies fevers, chills or abnormal weight loss Eyes: Denies blurriness of vision Ears, nose, mouth, throat, and face: Denies mucositis or sore throat Respiratory: Denies cough, dyspnea or wheezes Cardiovascular: Denies palpitation, chest discomfort or lower extremity  swelling Gastrointestinal:  Denies nausea, heartburn or change in bowel habits Skin: Denies abnormal skin rashes Lymphatics: Denies new lymphadenopathy or easy bruising Neurological:Denies numbness, tingling or new weaknesses Behavioral/Psych: Mood is stable, no new changes  All other systems were reviewed with the patient and are negative.  I have reviewed the past medical history, past surgical history, social history and family history with the patient and they are unchanged from previous note.  ALLERGIES:  has No Known Allergies.  MEDICATIONS:  Current Outpatient Medications  Medication Sig Dispense Refill  . benzonatate (TESSALON) 200 MG capsule Take 1 capsule (200 mg total) by mouth 2 (two) times daily as needed for cough. 20 capsule 0  . dextromethorphan-guaiFENesin (MUCINEX DM) 30-600 MG 12hr tablet Take 1 tablet by mouth 2 (two) times daily. 20 tablet 0  . FLUoxetine (PROZAC) 20 MG capsule Take 1 capsule (20 mg total) by mouth daily. 30 capsule 2  . levonorgestrel (MIRENA, 52 MG,) 20 MCG/24HR IUD 1 each by Intrauterine route once.     Marland Kitchen LORazepam (ATIVAN) 1 MG tablet Take 1 tablet (1 mg total) by mouth at bedtime. 60 tablet 0   No current facility-administered medications for this visit.     PHYSICAL EXAMINATION: ECOG PERFORMANCE STATUS: 0 - Asymptomatic  Vitals:   04/19/19 1155  BP: 122/80  Pulse: (!) 55  Resp: 18  Temp: 99.1 F (37.3 C)  SpO2: 100%   Filed Weights   04/19/19 1155  Weight: 213 lb 3.2 oz (96.7 kg)    GENERAL:alert, no distress and comfortable NEURO: alert & oriented x 3 with fluent speech, no focal motor/sensory deficits  LABORATORY DATA:  I have reviewed the data as listed    Component Value Date/Time  NA 136 04/11/2019 0939   K 3.9 04/11/2019 0939   CL 104 04/11/2019 0939   CO2 23 04/11/2019 0939   GLUCOSE 88 04/11/2019 0939   BUN 13 04/11/2019 0939   CREATININE 0.79 04/11/2019 0939   CREATININE 0.76 03/26/2018 1147   CALCIUM 9.4  04/11/2019 0939   PROT 7.4 04/11/2019 0939   ALBUMIN 4.3 04/11/2019 0939   AST 16 04/11/2019 0939   AST 14 (L) 03/26/2018 1147   ALT 15 04/11/2019 0939   ALT 24 03/26/2018 1147   ALKPHOS 70 04/11/2019 0939   BILITOT 0.9 04/11/2019 0939   BILITOT 1.0 03/26/2018 1147   GFRNONAA >60 04/11/2019 0939   GFRNONAA >60 03/26/2018 1147   GFRAA >60 04/11/2019 0939   GFRAA >60 03/26/2018 1147    No results found for: SPEP, UPEP  Lab Results  Component Value Date   WBC 7.8 04/11/2019   NEUTROABS 4.8 04/11/2019   HGB 14.7 04/11/2019   HCT 44.4 04/11/2019   MCV 91.7 04/11/2019   PLT 255 04/11/2019      Chemistry      Component Value Date/Time   NA 136 04/11/2019 0939   K 3.9 04/11/2019 0939   CL 104 04/11/2019 0939   CO2 23 04/11/2019 0939   BUN 13 04/11/2019 0939   CREATININE 0.79 04/11/2019 0939   CREATININE 0.76 03/26/2018 1147      Component Value Date/Time   CALCIUM 9.4 04/11/2019 0939   ALKPHOS 70 04/11/2019 0939   AST 16 04/11/2019 0939   AST 14 (L) 03/26/2018 1147   ALT 15 04/11/2019 0939   ALT 24 03/26/2018 1147   BILITOT 0.9 04/11/2019 0939   BILITOT 1.0 03/26/2018 1147       RADIOGRAPHIC STUDIES: I have reviewed multiple imaging studies with the patient I have personally reviewed the radiological images as listed and agreed with the findings in the report. Ct Chest W Contrast  Result Date: 04/11/2019 CLINICAL DATA:  Hodgkin's lymphoma EXAM: CT CHEST WITH CONTRAST TECHNIQUE: Multidetector CT imaging of the chest was performed during intravenous contrast administration. CONTRAST:  39mL OMNIPAQUE IOHEXOL 300 MG/ML  SOLN COMPARISON:  PET-CT 09/17/2018 FINDINGS: Cardiovascular: The heart size is normal. No substantial pericardial effusion. No thoracic aortic aneurysm. Mediastinum/Nodes: Interval increase in soft tissue attenuation in the anterior mediastinum, including a 1.7 x 2.1 cm nodular soft tissue focus on image 42/series 2. Other areas of nodular soft tissue in  the anterior mediastinum are well demonstrated on 46/2. Confluent anterior soft tissue is visible on 56/2. This is all progressive in the interval since 09/17/2018. No lymphadenopathy in the paratracheal or subcarinal spaces. No hilar lymphadenopathy. The esophagus has normal imaging features. There is no axillary lymphadenopathy. No evidence for subpectoral lymphadenopathy. Lungs/Pleura: 2 mm right upper lobe nodule on 34/7 is stable in the interval. 2 mm subpleural right middle lobe nodule on 100/7 is unchanged. No new suspicious pulmonary nodule or mass. No focal airspace consolidation. No pleural effusion. Upper Abdomen: Unremarkable. Musculoskeletal: No worrisome lytic or sclerotic osseous abnormality. IMPRESSION: Since the PET-CT of 09/17/2018, there has been progression of soft tissue in the anterior mediastinum. This may be related to thymic tissue although areas of the soft tissue are more nodular than typically seen for thymic remnant/rebound and close attention is recommended as recurrent lymphoma cannot be excluded. There was some soft tissue in this region on the prior PET-CT that demonstrated low level FDG accumulation. Repeat PET-CT may be warranted to further evaluate. Stable tiny right lung nodules,  most likely benign. Electronically Signed   By: Misty Stanley M.D.   On: 04/11/2019 12:01   Nm Pet Image Restag (ps) Skull Base To Thigh  Result Date: 04/19/2019 CLINICAL DATA:  Subsequent treatment strategy for lymphoma. EXAM: NUCLEAR MEDICINE PET SKULL BASE TO THIGH TECHNIQUE: 10.5 mCi F-18 FDG was injected intravenously. Full-ring PET imaging was performed from the skull base to thigh after the radiotracer. CT data was obtained and used for attenuation correction and anatomic localization. Fasting blood glucose: 79 mg/dl COMPARISON:  09/17/2018 FINDINGS: Mediastinal blood pool activity: SUV max 2.45 Liver activity: SUV max 3.94 NECK: No hypermetabolic lymph nodes in the neck. Incidental CT  findings: Small superior mediastinal/prevascular node with short axis of 0.9 cm has an SUV max of 4.13, image 52/4. Previously 1.61. Increase anterior mediastinal soft tissue with corresponding FDG uptake with SUV max of 5.28. Previously 4.3. CHEST: No FDG avid pulmonary nodules Incidental CT findings: none ABDOMEN/PELVIS: No abnormal FDG uptake identified within the liver, pancreas, spleen, or adrenal glands. No FDG avid lymph nodes within the abdomen or pelvis. Spleen is normal in size. Incidental CT findings: none SKELETON: No focal hypermetabolic activity to suggest skeletal metastasis. Incidental CT findings: none IMPRESSION: 1. Interval increase in triangular-shaped area of increased soft tissue within the anterior mediastinum with corresponding FDG uptake of 5.28. 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 Recurrent lymphoma is less favored. 2. Small soft tissue nodule which appears separate from the thymic gland within the superior mediastinum/prevascular region with SUV max of 4.13. Previously 1.61. Although technically borderline Deauville criteria 4, this is only minimally above liver activity. Suggest close interval follow-up. Electronically Signed   By: Kerby Moors M.D.   On: 04/19/2019 09:21    All questions were answered. The patient knows to call the clinic with any problems, questions or concerns. No barriers to learning was detected.  I spent 10 minutes counseling the patient face to face. The total time spent in the appointment was 15 minutes and more than 50% was on counseling and review of test results  Heath Lark, MD 04/19/2019 4:26 PM

## 2019-04-19 NOTE — Assessment & Plan Note (Signed)
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

## 2019-04-23 ENCOUNTER — Other Ambulatory Visit: Payer: Self-pay | Admitting: Hematology and Oncology

## 2019-04-23 ENCOUNTER — Telehealth: Payer: Self-pay | Admitting: Hematology and Oncology

## 2019-04-23 DIAGNOSIS — C8170 Other classical Hodgkin lymphoma, unspecified site: Secondary | ICD-10-CM

## 2019-04-23 NOTE — Telephone Encounter (Signed)
The patient's case was discussed at the hematology tumor board this morning Overall consensus would be to repeat PET CT scan in 3 months The patient is informed with the plan of care I will try to order a PET CT scan on December 16 and see her on December 17.

## 2019-04-25 ENCOUNTER — Telehealth: Payer: Self-pay | Admitting: Hematology and Oncology

## 2019-04-25 NOTE — Telephone Encounter (Signed)
I talk with patient regarding schedule  

## 2019-04-26 ENCOUNTER — Ambulatory Visit: Payer: PRIVATE HEALTH INSURANCE | Admitting: Hematology and Oncology

## 2019-04-28 IMAGING — US US ART/VEN ABD/PELV/SCROTUM DOPPLER LTD
1 series · 13 of 25 positions shown · non-contrast
Comparison: CT from earlier in the same day.

CLINICAL DATA: Pelvic pain for several hours

EXAM:
TRANSABDOMINAL AND TRANSVAGINAL ULTRASOUND OF PELVIS
DOPPLER ULTRASOUND OF OVARIES
TECHNIQUE: Both transabdominal and transvaginal ultrasound examinations of the
pelvis were performed. Transabdominal technique was performed for
global imaging of the pelvis including uterus, ovaries, adnexal
regions, and pelvic cul-de-sac.
It was necessary to proceed with endovaginal exam following the
transabdominal exam to visualize the ovaries. Color and duplex
Doppler ultrasound was utilized to evaluate blood flow to the
ovaries.

[Series 1: us art/ven abd/pelv/scrotum doppler ltd · 13 of 115 slices shown]
[im 1/115]
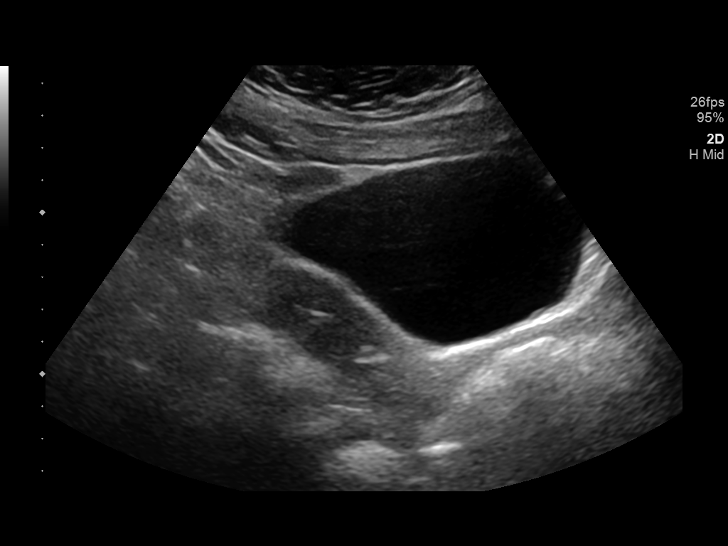
[im 10/115]
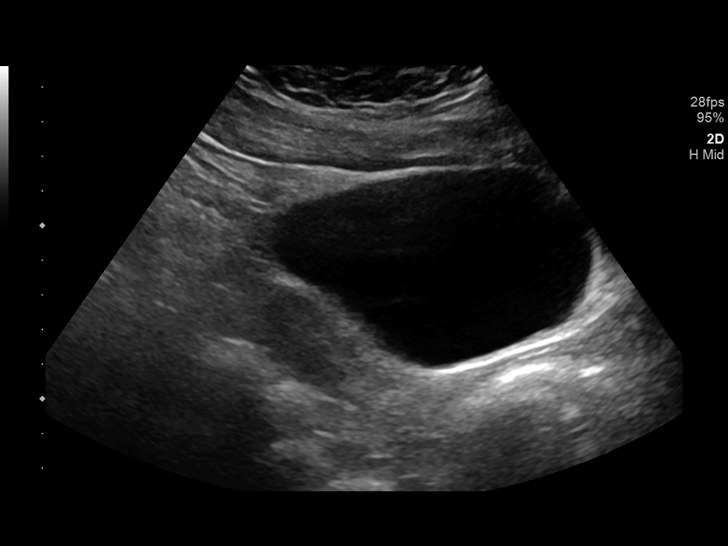
[im 20/115]
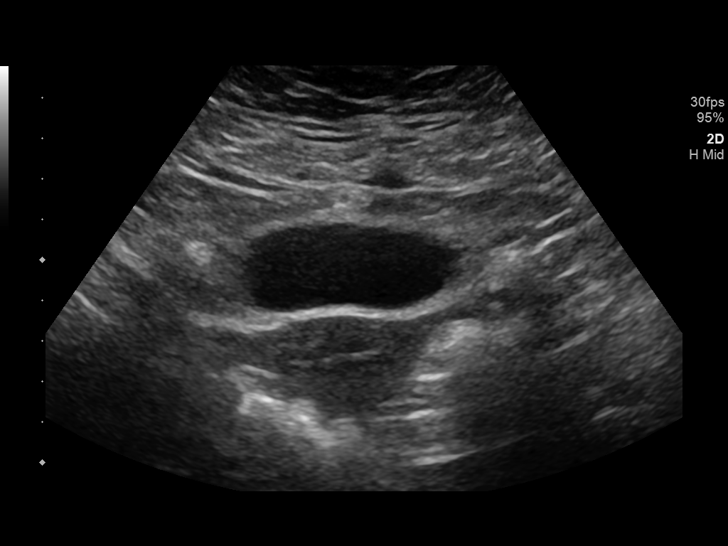
[im 29/115]
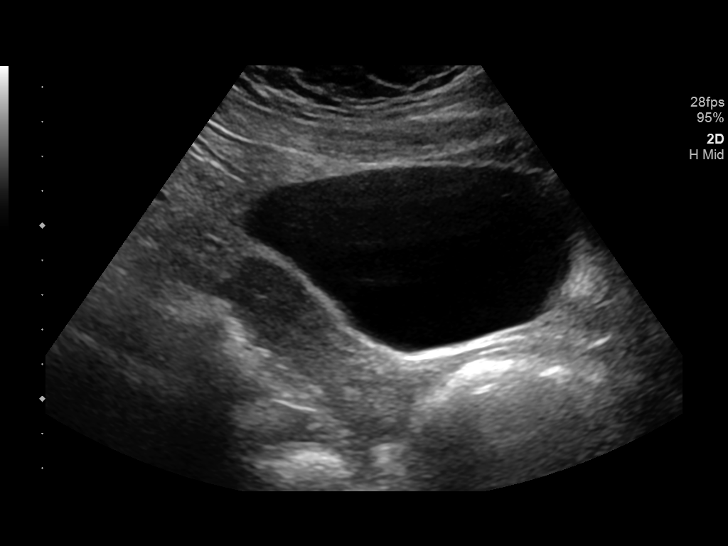
[im 39/115]
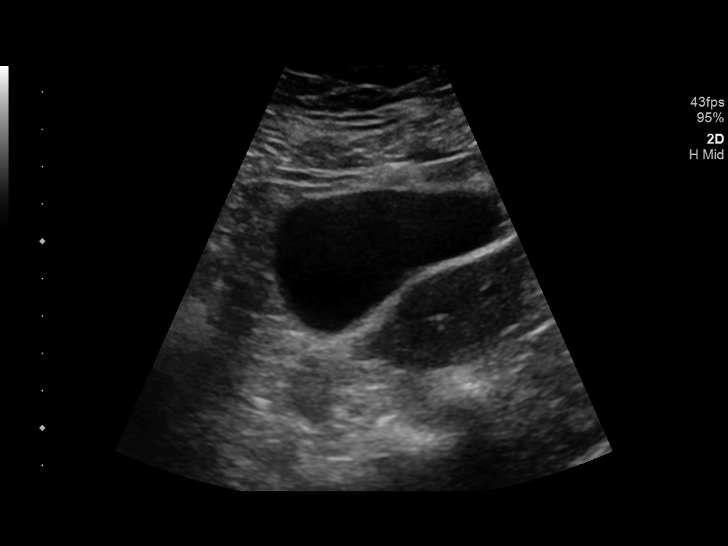
[im 48/115]
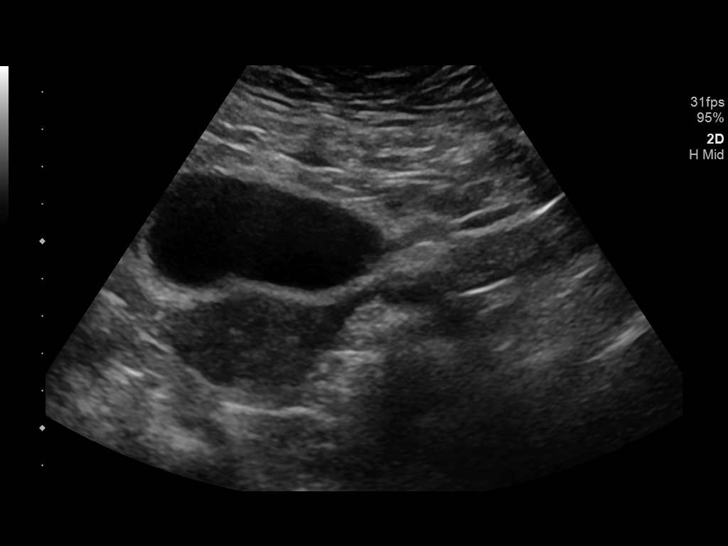
[im 58/115]
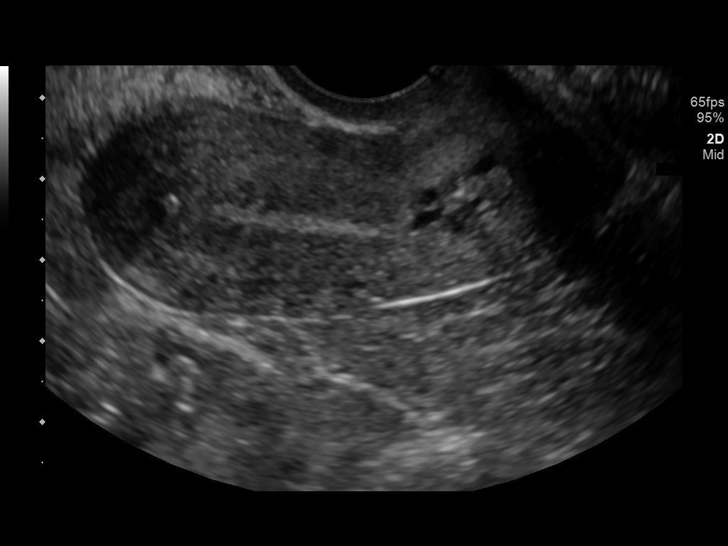
[im 67/115]
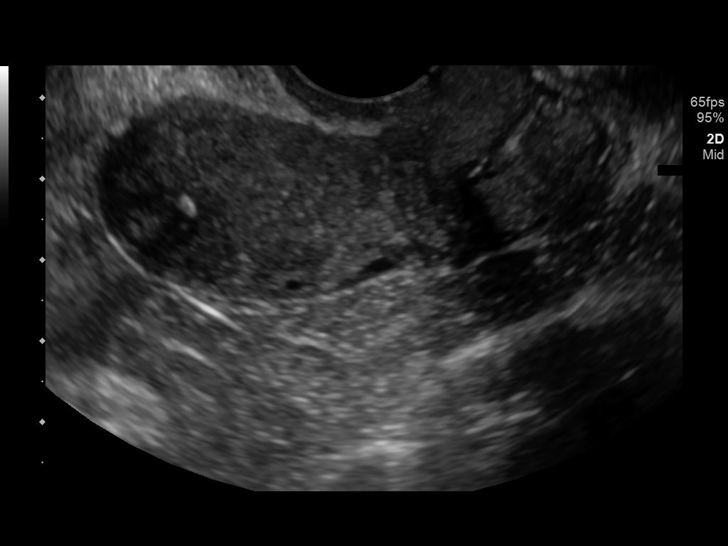
[im 77/115]
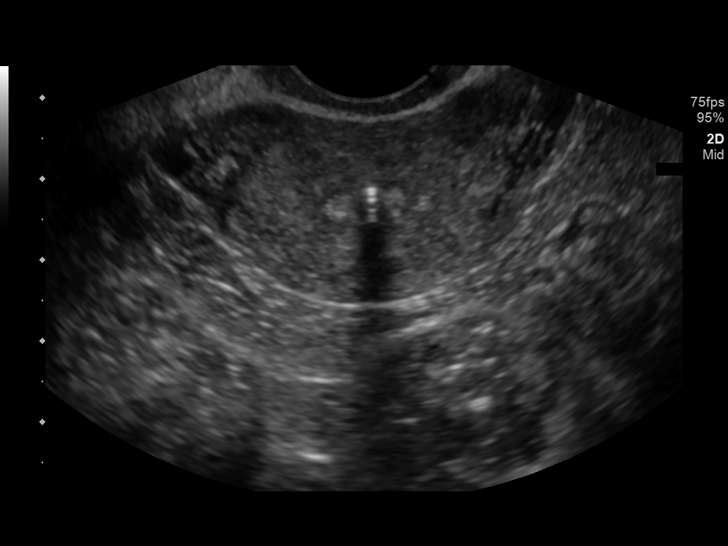
[im 86/115]
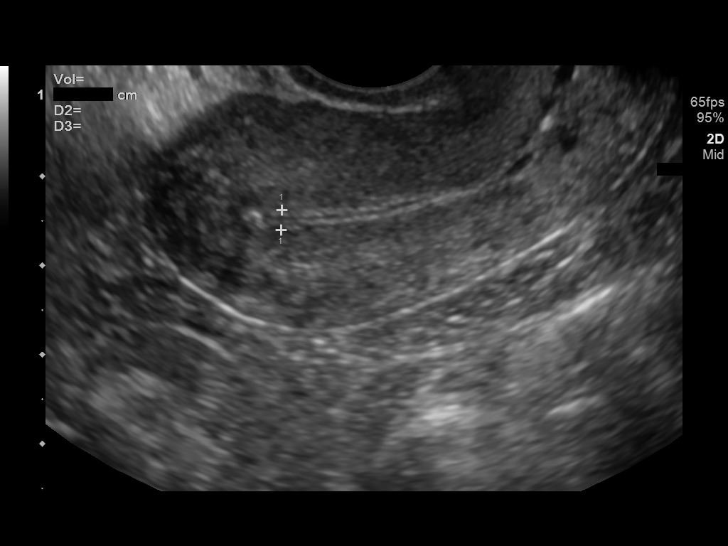
[im 96/115]
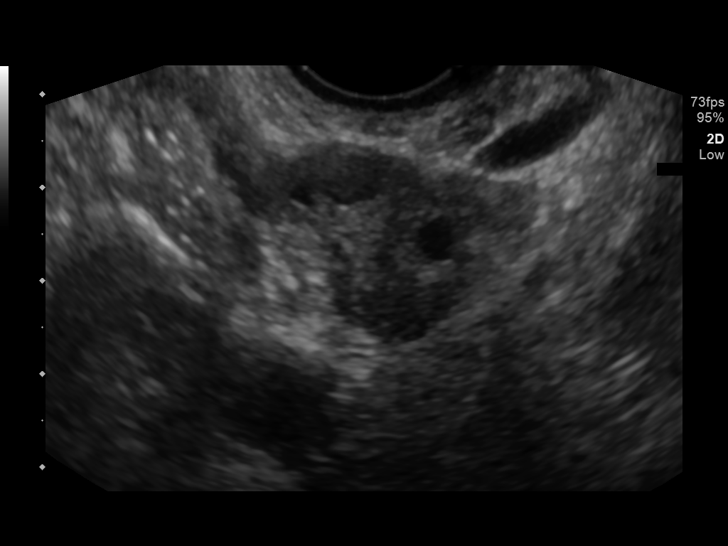
[im 105/115]
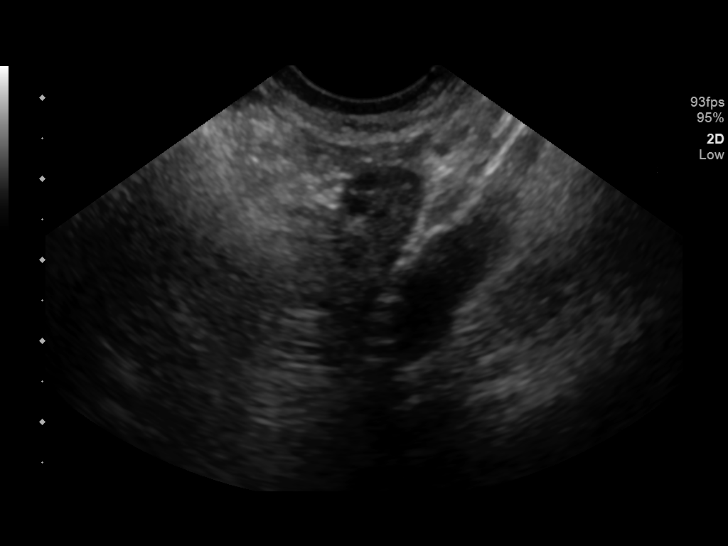
[im 115/115]
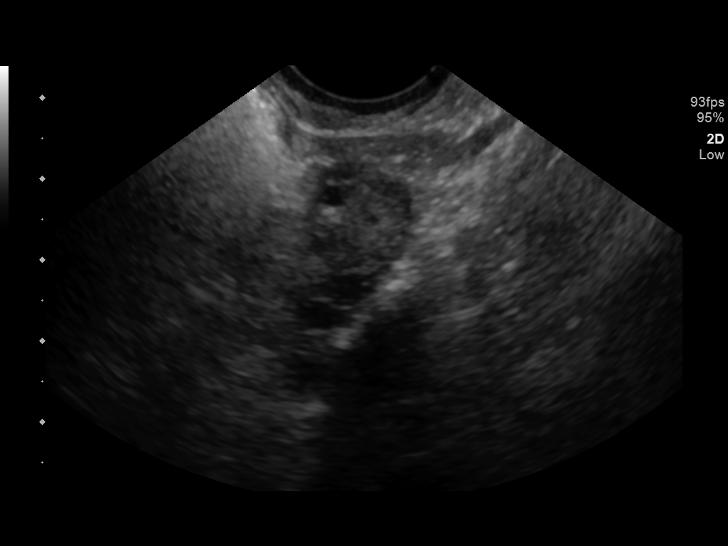

[13 of 25 positions shown; findings below may reference images not displayed]

FINDINGS: Uterus

Measurements: 6.7 x 2.7 x 4.3 cm. = volume: 40 mL. No fibroids or
other mass visualized.

Endometrium

Thickness: 2.2 mm.  IUD is noted in place.

Right ovary

Measurements: 2.4 x 1.7 x 2.2 cm. = volume: 5 mL. Normal
appearance/no adnexal mass.

Left ovary

Measurements: 2.3 x 1.7 x 1.1 cm. = volume: 2.2 mL. Normal
appearance/no adnexal mass.

Pulsed Doppler evaluation of both ovaries demonstrates normal
low-resistance arterial and venous waveforms.

Other findings

No abnormal free fluid.
IMPRESSION: Unremarkable pelvic ultrasound.

IUD in place similar to that seen on recent CT.

## 2019-05-12 IMAGING — CT NM PET TUM IMG RESTAG (PS) SKULL BASE T - THIGH
8 of 9 series · 20 of 25 positions shown · non-contrast
Comparison: 05/01/2018

CLINICAL DATA: Subsequent treatment strategy for Hodgkin's
lymphoma.

EXAM:
NUCLEAR MEDICINE PET SKULL BASE TO THIGH
TECHNIQUE: 11.0 mCi F-18 FDG was injected intravenously. Full-ring PET imaging
was performed from the skull base to thigh after the radiotracer. CT
data was obtained and used for attenuation correction and anatomic
localization.
Fasting blood glucose: A5 mg/dl

[Series 3: pet sk_thigh ac · axial · 5.0mm · 4.07mm/px · z∈[-850,+78]mm · 3 of 233 slices shown]
[im 1/233]
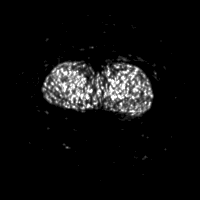
[im 78/233]
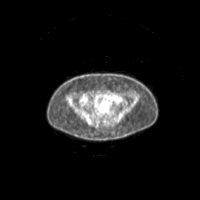
[im 233/233]
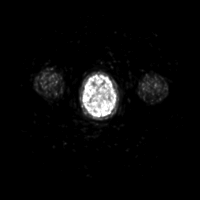

[Series 4: ct sk_thigh 5.0 hd_fov · axial · 5.0mm · 0.98mm/px · z∈[-850,+78]mm · 4 of 232 slices shown]
[im 1/232]
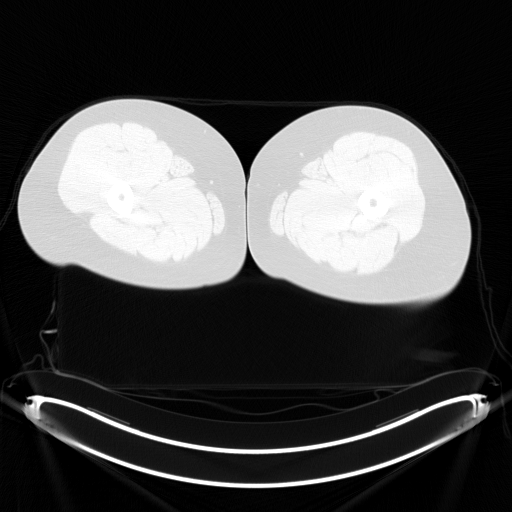
[im 58/232]
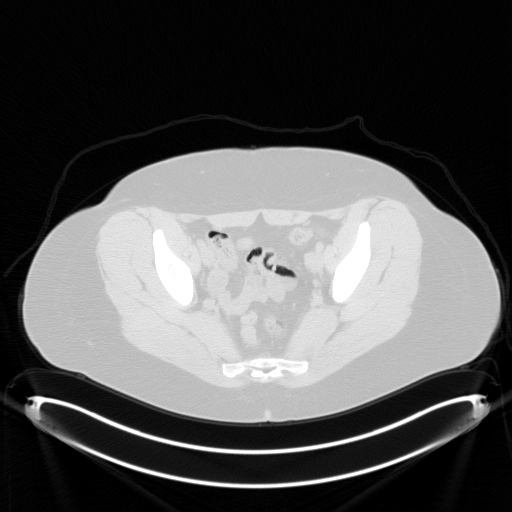
[im 116/232]
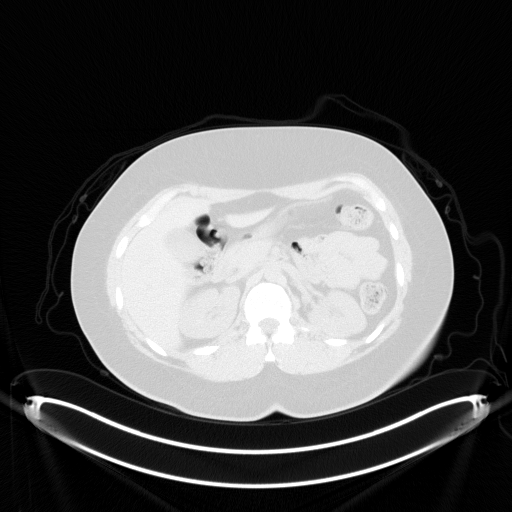
[im 232/232]
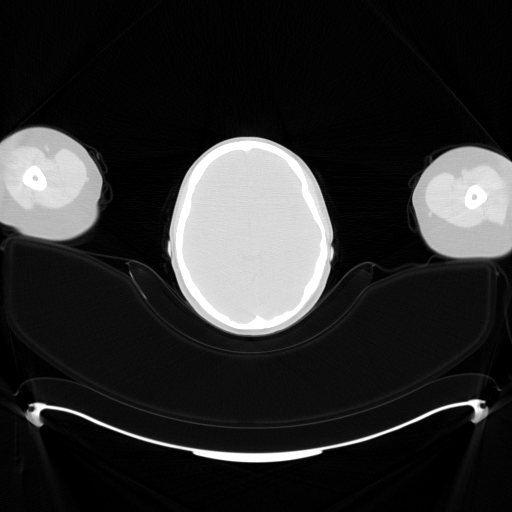

[Series 5: pet sk_thigh nac · axial · 5.0mm · 4.07mm/px · z∈[-850,+78]mm · 4 of 233 slices shown]
[im 1/233]
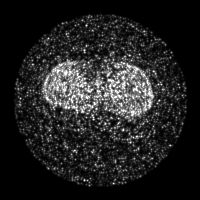
[im 59/233]
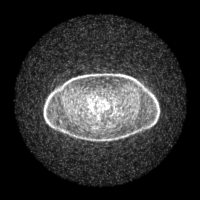
[im 117/233]
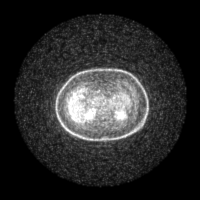
[im 233/233]
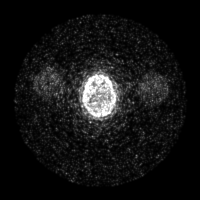

[Series 8: ct sk_thigh 5.0 (id) lung_bone · axial · 5.0mm · 0.64mm/px · 1 of 61 slices shown]
[im 1/61  bone]
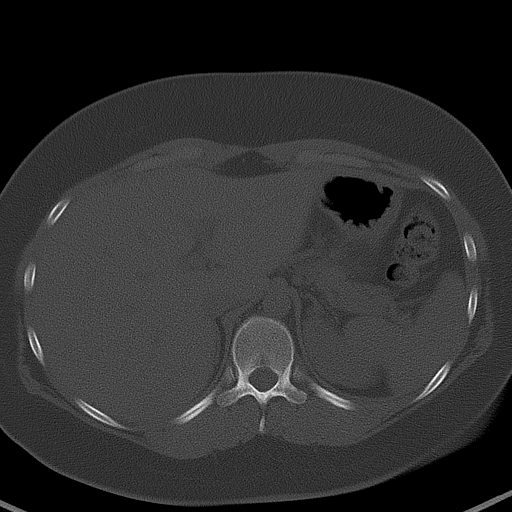

[Series 603: range-ct sk_thigh 5.0 hd_fov-cor-<alpha range> · 2 of 90 slices shown]
[im 1/90]
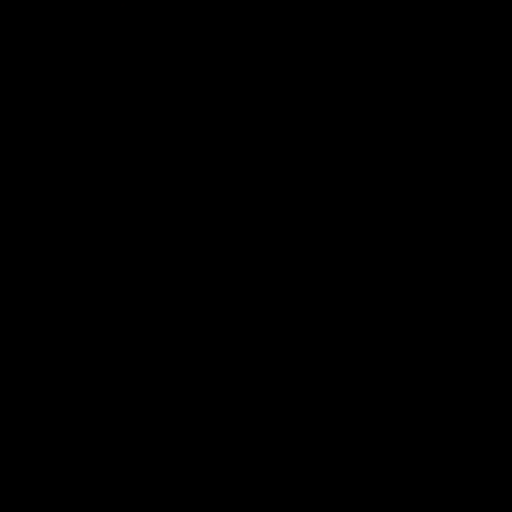
[im 90/90]
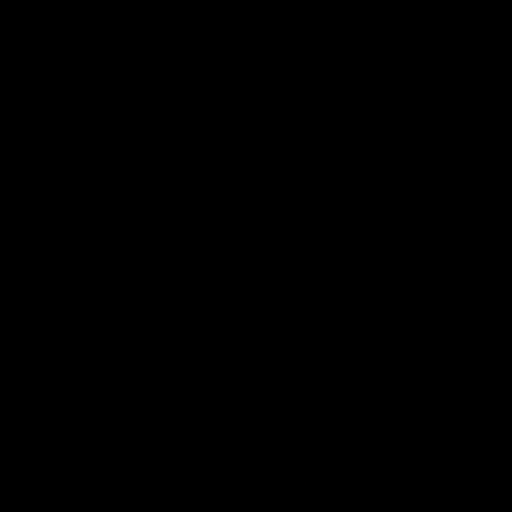

[Series 605: range-ct sk_thigh 5.0 hd_fov-tra-<alpha range> · 4 of 226 slices shown]
[im 1/226]
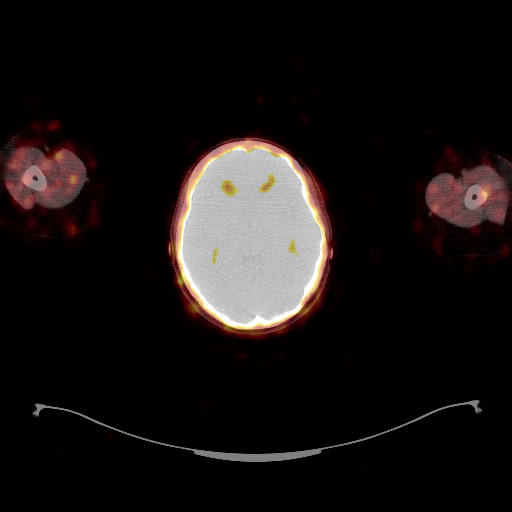
[im 57/226]
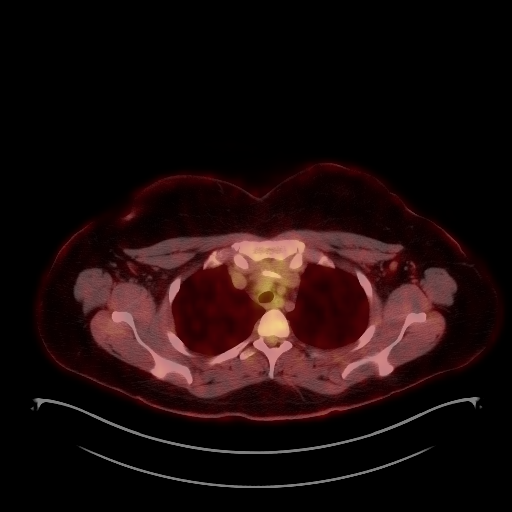
[im 113/226]
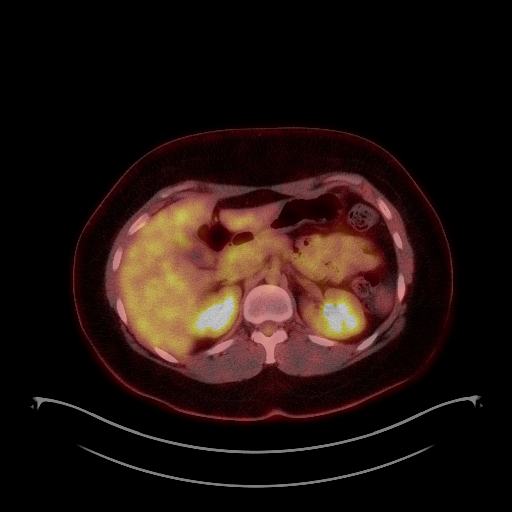
[im 169/226]
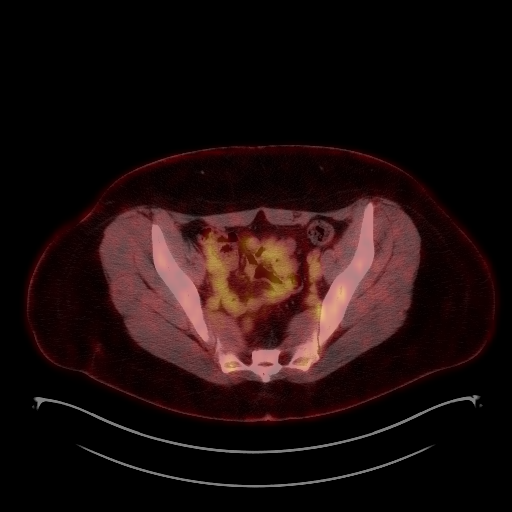

[Series 1390: results mm oncology reading · 5.0mm · 0.79mm/px · 1 of 5 slices shown (1 of 2)]
[im 1/5]
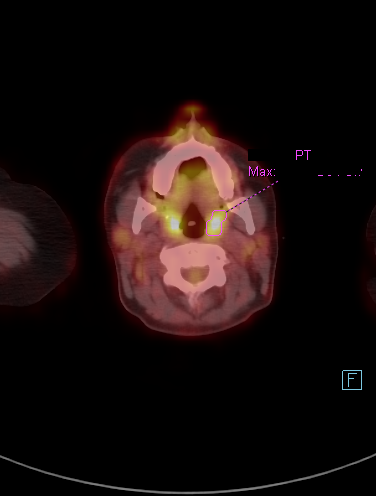

[Series 1538: results mm oncology reading · 5.0mm · 0.45mm/px · 1 of 1 slices shown (2 of 2)]
[im 1/1]
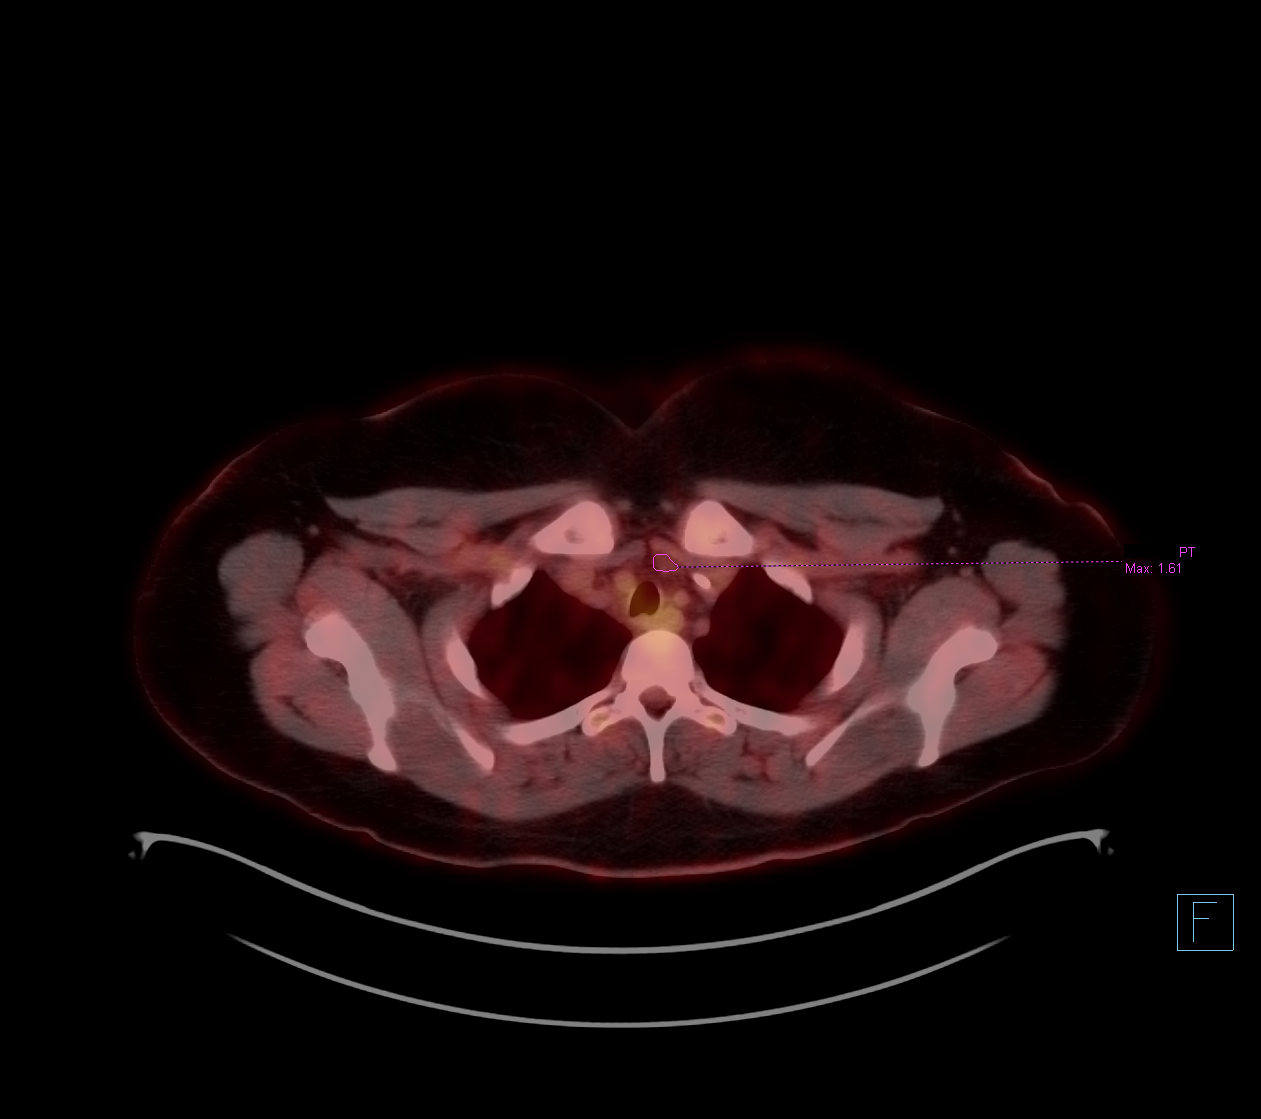

[20 of 25 positions shown; findings below may reference images not displayed]

FINDINGS: Mediastinal blood pool activity: SUV max

Background hepatic activity: SUV max

NECK: Accentuated bilateral palatine tonsillar activity, maximum SUV
10.2 in the right and 11.6 on the left. There is some continued mild
accentuation of submandibular gland activity which is symmetric and,
much like the tonsillar activity, probably physiologic. Likewise
glottic activity and lingual activity do not have a CT correlate and
are likely physiologic.

No recurrent hypermetabolic nodal activity in the neck, previous
activity has resolved ([[facility]OSPITAL] 1).

Incidental CT findings: Chronic ethmoid and bilateral maxillary
sinusitis.

CHEST: Mild anterior mediastinal soft tissue density likely
represents thymus and has maximum SUV of 4.3. This does not appear
bulky and I am skeptical that it represents active lymphoma. No
recurrent pathologically enlarged or hypermetabolic adenopathy.

Incidental CT findings: Left Port-A-Cath tip: SVC.

ABDOMEN/PELVIS: No significant abnormal hypermetabolic activity in
this region.

Incidental CT findings: No splenomegaly. IUD noted along the
endometrium.

SKELETON: No significant abnormal hypermetabolic activity in this
region.

Incidental CT findings: none
IMPRESSION: 1. Faintly accentuated thymic activity with maximum SUV 4.3, which
is at the [[facility]OSPITAL] 4 level. However, this may simply represent
rebound or response to therapy, there is no masslike appearance in
the anterior mediastinum.
2. Accentuated bilateral palatine tonsillar activity without CT
correlate, probably physiologic/incidental.
3. Otherwise no accentuated activity or pathologic enlarged
adenopathy in the neck, chest, abdomen, or pelvis to suggest active
disease. This would correspond to [[facility]OSPITAL] 1 response.
4. Chronic ethmoid and bilateral maxillary sinusitis.

## 2019-07-24 ENCOUNTER — Telehealth: Payer: Self-pay | Admitting: Hematology and Oncology

## 2019-07-24 ENCOUNTER — Ambulatory Visit (HOSPITAL_COMMUNITY): Payer: 59

## 2019-07-24 ENCOUNTER — Inpatient Hospital Stay: Payer: 59

## 2019-07-24 ENCOUNTER — Telehealth: Payer: Self-pay

## 2019-07-24 NOTE — Telephone Encounter (Signed)
Called to verify if she wanted to cancel tomorrow's appt with Dr. Alvy Bimler. Appt canceled per her request and rescheduled appt to 1/7 with Dr. Alvy Bimler.

## 2019-07-24 NOTE — Telephone Encounter (Signed)
Returned patient's phone call regarding cancelling 12/16, per patient's request.appointment has been cancelled.

## 2019-07-25 ENCOUNTER — Inpatient Hospital Stay: Payer: 59 | Admitting: Hematology and Oncology

## 2019-07-31 ENCOUNTER — Other Ambulatory Visit: Payer: Self-pay | Admitting: Hematology and Oncology

## 2019-08-14 ENCOUNTER — Other Ambulatory Visit: Payer: Self-pay

## 2019-08-14 ENCOUNTER — Ambulatory Visit (HOSPITAL_COMMUNITY)
Admission: RE | Admit: 2019-08-14 | Discharge: 2019-08-14 | Disposition: A | Discharge status: Home / Self Care | Payer: 59 | Source: Ambulatory Visit | Attending: Hematology and Oncology | Admitting: Hematology and Oncology

## 2019-08-14 DIAGNOSIS — C8170 Other classical Hodgkin lymphoma, unspecified site: Secondary | ICD-10-CM | POA: Diagnosis present

## 2019-08-14 LAB — GLUCOSE, CAPILLARY: Glucose-Capillary: 97 mg/dL (ref 70–99)

## 2019-08-14 MED ORDER — FLUDEOXYGLUCOSE F - 18 (FDG) INJECTION
9.4000 | Freq: Once | INTRAVENOUS | Status: AC | PRN
  Administered 2019-08-14: 9.4 via INTRAVENOUS

## 2019-08-15 ENCOUNTER — Inpatient Hospital Stay: Payer: 59 | Attending: Hematology and Oncology | Admitting: Hematology and Oncology

## 2019-08-15 ENCOUNTER — Other Ambulatory Visit: Payer: Self-pay

## 2019-08-15 ENCOUNTER — Encounter: Payer: Self-pay | Admitting: Hematology and Oncology

## 2019-08-15 ENCOUNTER — Telehealth: Payer: Self-pay | Admitting: Hematology and Oncology

## 2019-08-15 VITALS — BP 112/65 | HR 62 | Temp 98.1°F | Resp 20 | Wt 197.0 lb

## 2019-08-15 DIAGNOSIS — C8170 Other classical Hodgkin lymphoma, unspecified site: Secondary | ICD-10-CM | POA: Diagnosis not present

## 2019-08-15 DIAGNOSIS — Z9221 Personal history of antineoplastic chemotherapy: Secondary | ICD-10-CM | POA: Insufficient documentation

## 2019-08-15 DIAGNOSIS — Z793 Long term (current) use of hormonal contraceptives: Secondary | ICD-10-CM | POA: Insufficient documentation

## 2019-08-15 DIAGNOSIS — E320 Persistent hyperplasia of thymus: Secondary | ICD-10-CM | POA: Insufficient documentation

## 2019-08-15 DIAGNOSIS — C8190 Hodgkin lymphoma, unspecified, unspecified site: Secondary | ICD-10-CM | POA: Insufficient documentation

## 2019-08-15 NOTE — Assessment & Plan Note (Signed)
I have reviewed numerous imaging studies with the patient For now, I recommend close observation with serial imaging study and she agreed

## 2019-08-15 NOTE — Assessment & Plan Note (Signed)
She is not symptomatic Her case has been presented at the hematology tumor board numerous times PET CT scan showed regression of the size of lymphadenopathy The abnormalities seen in the mediastinum is most consistent with thymic hyperplasia I recommend repeat imaging study again in 6 months for further follow-up and she agreed with the plan of care We discussed the importance of vitamin D supplementation.

## 2019-08-15 NOTE — Progress Notes (Signed)
[place] OFFICE PROGRESS NOTE  Patient Care Team: [name], MD as PCP - General (Family Medicine)  ASSESSMENT & PLAN:  Classical Hodgkin lymphoma [facility]) She is not symptomatic Her case has been presented at the hematology tumor board numerous times PET CT scan showed regression of the size of lymphadenopathy The abnormalities seen in the mediastinum is most consistent with thymic hyperplasia I recommend repeat imaging study again in 6 months for further follow-up and she agreed with the plan of care We discussed the importance of vitamin D supplementation.  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 CT Chest W Contrast    Standing Status:   Future    Standing Expiration Date:   08/14/2020    Order Specific Question:   If indicated for the ordered procedure, I authorize the administration of contrast media per Radiology protocol    Answer:   Yes    Order Specific Question:   Preferred imaging location?    Answer:   [facility]    Order Specific Question:   Radiology Contrast Protocol - do NOT remove file path    Answer:   [email or web address]    Order Specific Question:   ** REASON FOR EXAM (FREE TEXT)    Answer:   f/up on abnormal imaging    Order Specific Question:   Is patient pregnant?    Answer:   No  . Comprehensive metabolic panel    Standing Status:   Future    Standing Expiration Date:   09/18/2020  . CBC with Differential    Standing Status:   Future    Standing Expiration Date:   09/18/2020  . Pregnancy, urine    Standing Status:   Future    Standing Expiration Date:   08/14/2020    All questions were answered. The patient knows to call the clinic with any problems, questions or concerns. The total time spent in the appointment was 20  minutes encounter with patients including review of chart and various tests results, discussions about plan of care and coordination of care plan   Heath Lark, MD 08/15/2019 1:26 PM  INTERVAL HISTORY: Please see below for problem oriented charting.  SUMMARY OF ONCOLOGIC HISTORY: Oncology History  Classical Hodgkin lymphoma ([place])  01/22/2018 Imaging   CT scan of neck: Adenopathy throughout the right lateral neck, right supraclavicular fossa, and upper mediastinum. There is no specific imaging feature, lymphoma or atypical infection are the primary considerations and excisional biopsy should be considered if there is no diagnosis by history or labs.    01/31/2018 Pathology Results   Lymph node for lymphoma, right supraclavicular / right cervical - ATYPICAL LYMPHOID PROLIFERATION. - SEE COMMENT. Microscopic Comment Sections show a few very small needle core biopsy fragments of lymph nodal tissue displaying a polymorphous cellular proliferation of small lymphocytes, plasma cells, eosinophils and histiocytes in addition to variable numbers of large mononuclear and multilobated lymphoid appearing cells with variably prominent nucleoli. Flow cytometric was performed [id number]) and failed to show any monoclonal B-cell population or abnormal T-cell phenotype. Immunohistochemical stains were performed, including CD15, CD20, CD3, CD30, LCA, and PAX-5 with appropriate controls. The large atypical lymphoid appearing cells are positive for CD15, CD30, CD20 and PAX-5 and negative for CD3 and LCA. The small lymphoid component in the background is mostly composed  of T cells. The morphologic and phenotypic features are atypical and highly suspicious for involvement by a lymphoproliferative process, particularly classical Hodgkin lymphoma. Excisional biopsy is recommended.   01/31/2018 Procedure   Ultrasound-guided core biopsies of right cervical lymph nodes.   02/15/2018 Pathology Results   Lymph node for  lymphoma, Right Cervical - CLASSICAL HODGKIN LYMPHOMA, NODULAR SCLEROSIS TYPE   03/05/2018 PET scan   1. Hypermetabolic RIGHT level 2 lymph nodes, supraclavicular lymph nodes, sub pectoralis lymph nodes and RIGHT axillary lymph nodes. 2. Hypermetabolic mediastinal lymph nodes. 3. No evidence of lymphoma beneath the diaphragms. Normal spleen. 4. Normal bone marrow.   03/05/2018 Imaging   LV EF: 55% -  60%   03/06/2018 Cancer Staging   Staging form: Hodgkin and Non-Hodgkin Lymphoma, AJCC 8th Edition - Clinical stage from 03/06/2018: Stage II (Hodgkin lymphoma, B - Symptoms) - Signed by Heath Lark, MD on 03/06/2018   03/08/2018 - 08/17/2018 Chemotherapy   The patient had ABVD for chemotherapy treatment. After interim scan showed complete response, Bleomycin was discontinued    05/01/2018 PET scan   1. Near complete resolution of metabolic activity within RIGHT cervical lymph nodes, mediastinal nodes, RIGHT supraclavicular nodes, and axillary nodes ( Deauville 1 and 2). 2. No evidence lymphoma progression. 3. Normal spleen and marrow. 4. Extensive hypermetabolic brown fat within the neck and thorax makes evaluation challenging.   06/05/2018 Echocardiogram   LV EF: 60% -  65%   09/03/2018 Imaging   No acute abnormality identified in the abdomen or pelvis.   09/17/2018 PET scan   1. Faintly accentuated thymic activity with maximum SUV 4.3, which is at the Deauville 4 level. However, this may simply represent rebound or response to therapy, there is no masslike appearance in the anterior mediastinum. 2. Accentuated bilateral palatine tonsillar activity without CT correlate, probably physiologic/incidental. 3. Otherwise no accentuated activity or pathologic enlarged adenopathy in the neck, chest, abdomen, or pelvis to suggest active disease. This would correspond to Deauville 1 response. 4. Chronic ethmoid and bilateral maxillary sinusitis.     04/11/2019 Imaging   Ct scan of chest Since the  PET-CT of 09/17/2018, there has been progression of soft tissue in the anterior mediastinum. This may be related to thymic tissue although areas of the soft tissue are more nodular than typically seen for thymic remnant/rebound and close attention is recommended as recurrent lymphoma cannot be excluded. There was some soft tissue in this region on the prior PET-CT that demonstrated low level FDG accumulation. Repeat PET-CT may be warranted to further evaluate.   Stable tiny right lung nodules, most likely benign.   04/19/2019 PET scan   IMPRESSION: 1. Interval increase in triangular-shaped area of increased soft tissue within the anterior mediastinum with corresponding FDG uptake of 5.28. 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 Recurrent lymphoma is less favored. 2. Small soft tissue nodule which appears separate from the thymic gland within the superior mediastinum/prevascular region with SUV max of 4.13. Previously 1.61. Although technically borderline Deauville criteria 4, this is only minimally above liver activity. Suggest close interval follow-up.   08/14/2019 PET scan   1. 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 2. Small soft tissue nodule, potentially small lymph node or even thymic tissue also diminished in size with decreased FDG uptake since the previous study. 3. Technically findings while slightly greater than liver may meet criteria for Deauville  4 there is improvement since the prior exam and what is presumed to be thymic rebound hyperplasia. Continued follow-up may be helpful.     REVIEW OF SYSTEMS:   Constitutional: Denies fevers, chills or abnormal weight loss Eyes: Denies blurriness of vision Ears, nose, mouth, throat, and face: Denies mucositis or sore throat Respiratory: Denies cough, dyspnea or  wheezes Cardiovascular: Denies palpitation, chest discomfort or lower extremity swelling Gastrointestinal:  Denies nausea, heartburn or change in bowel habits Skin: Denies abnormal skin rashes Lymphatics: Denies new lymphadenopathy or easy bruising Neurological:Denies numbness, tingling or new weaknesses Behavioral/Psych: Mood is stable, no new changes  All other systems were reviewed with the patient and are negative.  I have reviewed the past medical history, past surgical history, social history and family history with the patient and they are unchanged from previous note.  ALLERGIES:  has No Known Allergies.  MEDICATIONS:  Current Outpatient Medications  Medication Sig Dispense Refill  . levonorgestrel (MIRENA, 52 MG,) 20 MCG/24HR IUD 1 each by Intrauterine route once.      No current facility-administered medications for this visit.    PHYSICAL EXAMINATION: ECOG PERFORMANCE STATUS: 0 - Asymptomatic  Vitals:   08/15/19 1103  BP: 112/65  Pulse: 62  Resp: 20  Temp: 98.1 F (36.7 C)  SpO2: 100%   Filed Weights   08/15/19 1103  Weight: 197 lb (89.4 kg)    GENERAL:alert, no distress and comfortable NEURO: alert & oriented x 3 with fluent speech, no focal motor/sensory deficits  LABORATORY DATA:  I have reviewed the data as listed    Component Value Date/Time   NA 136 04/11/2019 0939   K 3.9 04/11/2019 0939   CL 104 04/11/2019 0939   CO2 23 04/11/2019 0939   GLUCOSE 88 04/11/2019 0939   BUN 13 04/11/2019 0939   CREATININE 0.79 04/11/2019 0939   CREATININE 0.76 03/26/2018 1147   CALCIUM 9.4 04/11/2019 0939   PROT 7.4 04/11/2019 0939   ALBUMIN 4.3 04/11/2019 0939   AST 16 04/11/2019 0939   AST 14 (L) 03/26/2018 1147   ALT 15 04/11/2019 0939   ALT 24 03/26/2018 1147   ALKPHOS 70 04/11/2019 0939   BILITOT 0.9 04/11/2019 0939   BILITOT 1.0 03/26/2018 1147   GFRNONAA >60 04/11/2019 0939   GFRNONAA >60 03/26/2018 1147   GFRAA >60 04/11/2019 0939   GFRAA >60  03/26/2018 1147    No results found for: SPEP, UPEP  Lab Results  Component Value Date   WBC 7.8 04/11/2019   NEUTROABS 4.8 04/11/2019   HGB 14.7 04/11/2019   HCT 44.4 04/11/2019   MCV 91.7 04/11/2019   PLT 255 04/11/2019      Chemistry      Component Value Date/Time   NA 136 04/11/2019 0939   K 3.9 04/11/2019 0939   CL 104 04/11/2019 0939   CO2 23 04/11/2019 0939   BUN 13 04/11/2019 0939   CREATININE 0.79 04/11/2019 0939   CREATININE 0.76 03/26/2018 1147      Component Value Date/Time   CALCIUM 9.4 04/11/2019 0939   ALKPHOS 70 04/11/2019 0939   AST 16 04/11/2019 0939   AST 14 (L) 03/26/2018 1147   ALT 15 04/11/2019 0939   ALT 24 03/26/2018 1147   BILITOT 0.9 04/11/2019 0939   BILITOT 1.0 03/26/2018 1147       RADIOGRAPHIC STUDIES: I have reviewed multiple imaging studies with the patient I have personally reviewed the radiological images as listed and agreed with the findings  in the report. NM PET Image Restag (PS) Skull Base To Thigh  Result Date: 08/14/2019 CLINICAL DATA:  Subsequent treatment strategy for Hodgkin lymphoma. EXAM: NUCLEAR MEDICINE PET SKULL BASE TO THIGH TECHNIQUE: 10.5 mCi F-18 FDG was injected intravenously. Full-ring PET imaging was performed from the skull base to thigh after the radiotracer. CT data was obtained and used for attenuation correction and anatomic localization. Fasting blood glucose: 79 mg/dl COMPARISON:  04/19/2019 FINDINGS: Mediastinal blood pool activity: SUV max 2.3 Liver activity: SUV max 3.5 NECK: No hypermetabolic lymph nodes in the neck. Incidental CT findings: None CHEST: Mildly hypermetabolic anterior mediastinal soft tissue measuring approximately 14 mm greatest thickness previously 17 mm (image 65, series 4) (SUVmax = 4.03 previously 5.28) no additional areas in the chest beyond blood pool or liver activity on today's study. Pre-vascular lymph node at 7 mm, previously 9 mm (SUVmax = 2.9) previously 4.1. Incidental CT findings:  Aortic caliber is normal. Noncontrast appearance of cardiac structures is unremarkable. Esophagus and thoracic inlet structures are normal. Signs of muscular activity and some mild activity in brown fat. Lungs are clear. No pleural effusion. Airways are patent. ABDOMEN/PELVIS: No abnormal hypermetabolic activity within the liver, pancreas, adrenal glands, or spleen. No hypermetabolic lymph nodes in the abdomen or pelvis. Incidental CT findings: IUD in situ. SKELETON: No focal hypermetabolic activity to suggest skeletal metastasis. Incidental CT findings: None IMPRESSION: 1. 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 2. Small soft tissue nodule, potentially small lymph node or even thymic tissue also diminished in size with decreased FDG uptake since the previous study. 3. 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 Continued follow-up may be helpful. Electronically Signed   By: Zetta Bills M.D.   On: 08/14/2019 14:36

## 2019-08-15 NOTE — Telephone Encounter (Signed)
Scheduled per 1/7 sch msg. Called and spoke with pt, confirmed 6/9 and 6/10 appt

## 2019-12-04 IMAGING — CT CT CHEST W/ CM
2 of 4 series · 15 of 36 positions shown, 18 images · IV contrast (OMNIPAQUE)
Comparison: PET-CT 09/17/2018

CLINICAL DATA: Hodgkin's lymphoma

EXAM:
CT CHEST WITH CONTRAST
TECHNIQUE: Multidetector CT imaging of the chest was performed during
intravenous contrast administration.
CONTRAST:  75mL OMNIPAQUE IOHEXOL 300 MG/ML  SOLN

[Series 2: axial st · axial · 0.74mm/px · z∈[+1471,+1755]mm · 12 of 166 slices shown, 15 images]
[im 12/166  mediastinal]
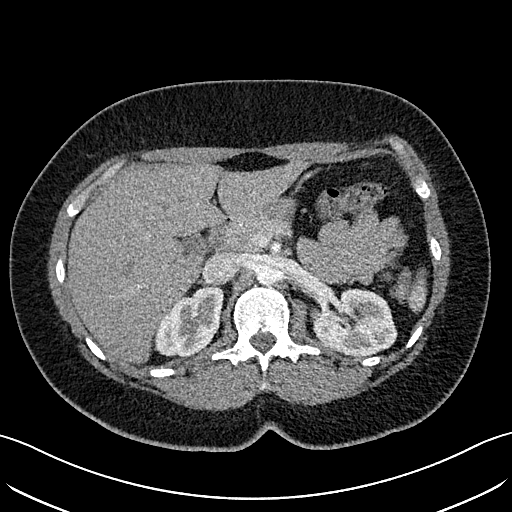
[im 12/166  lung]
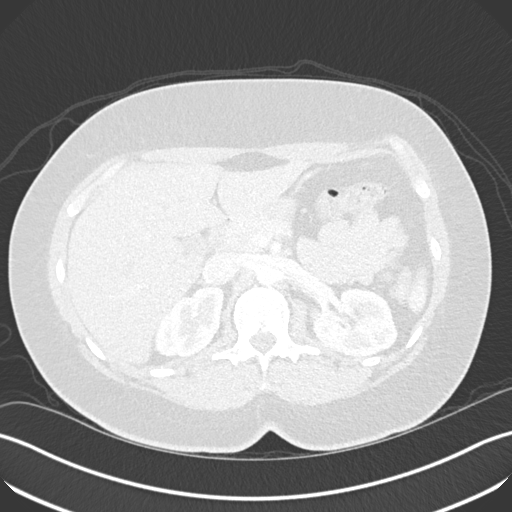
[im 24/166  lung]
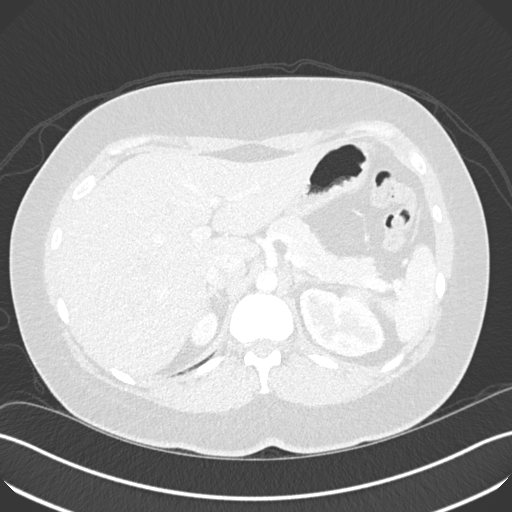
[im 36/166  lung]
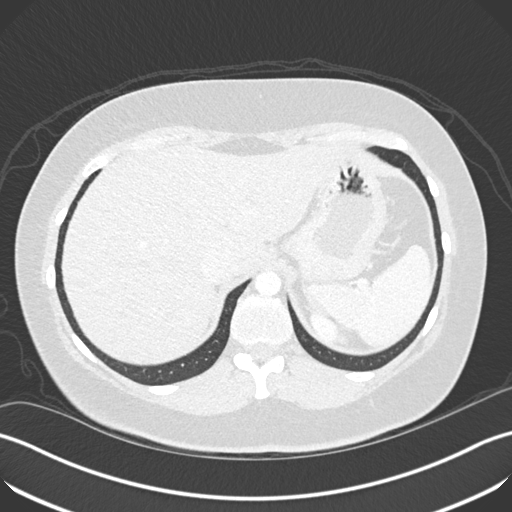
[im 48/166  lung]
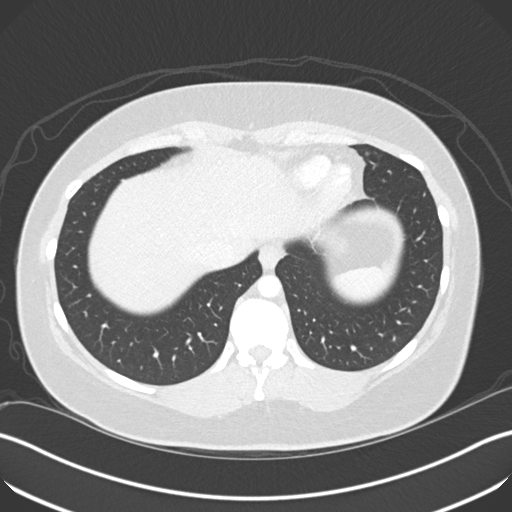
[im 59/166  mediastinal]
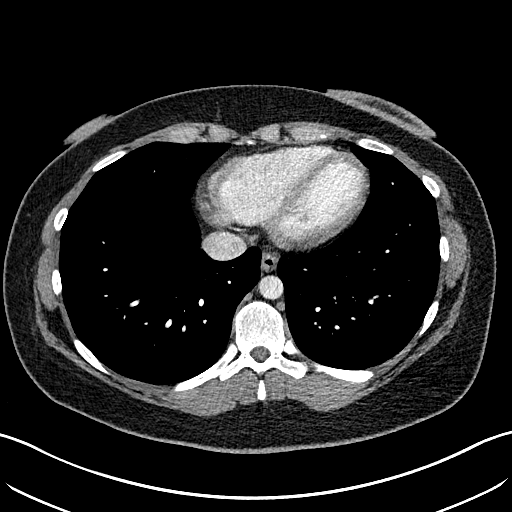
[im 59/166  lung]
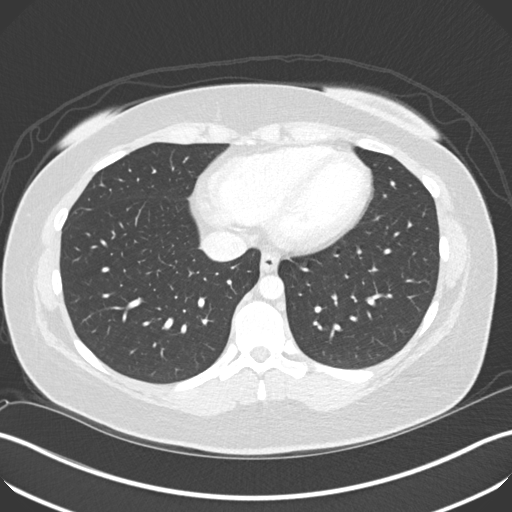
[im 71/166  lung]
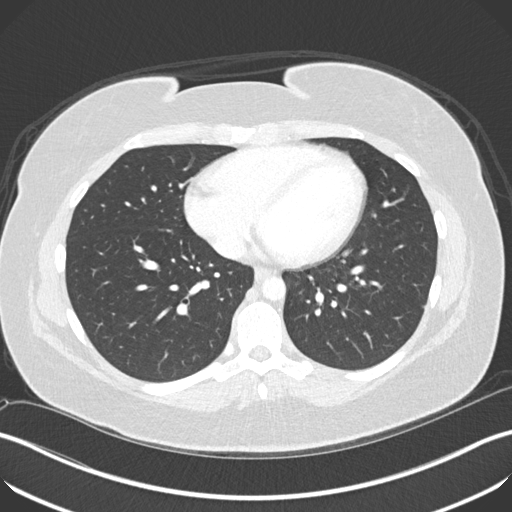
[im 95/166  lung]
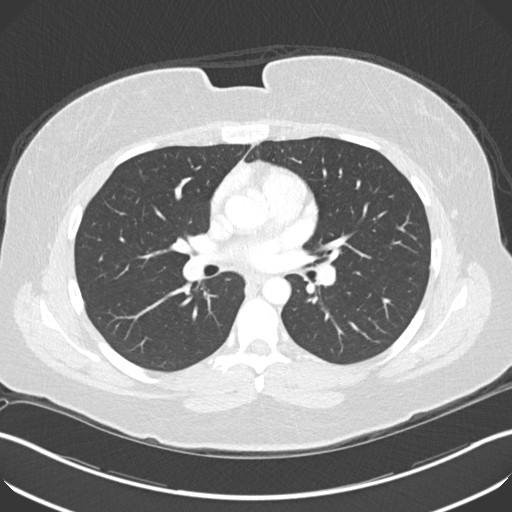
[im 107/166  lung]
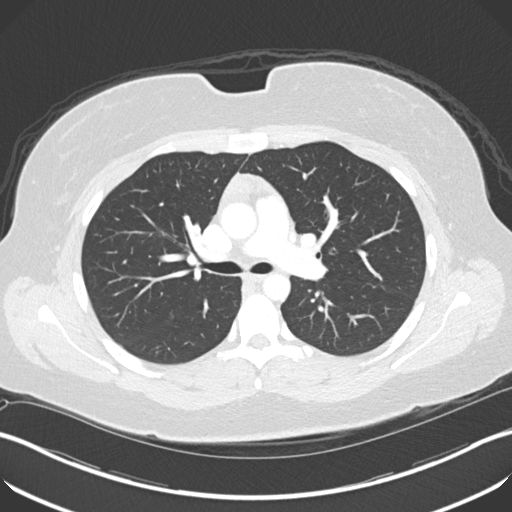
[im 118/166  mediastinal]
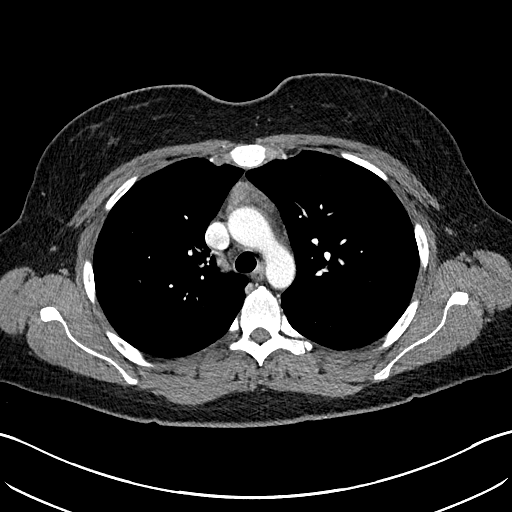
[im 118/166  lung]
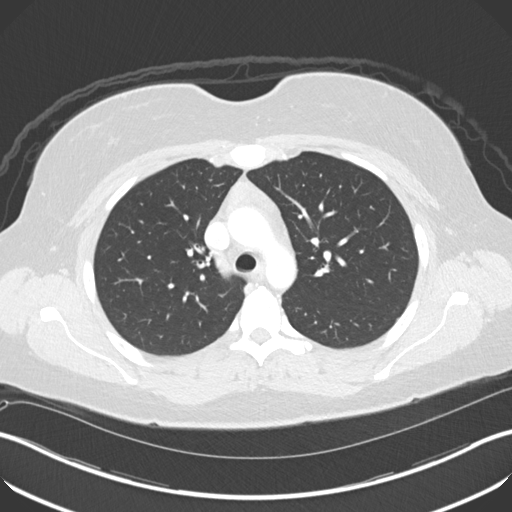
[im 130/166  lung]
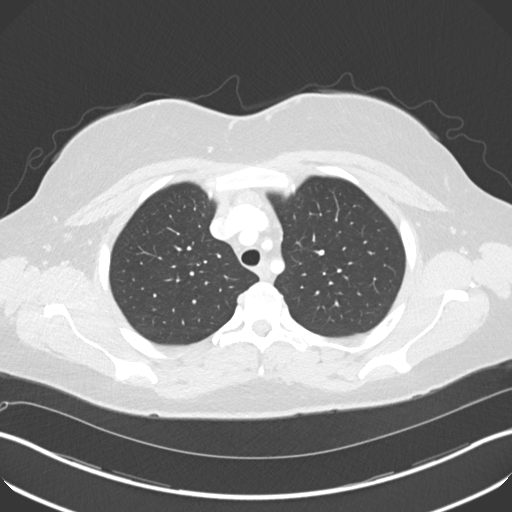
[im 142/166  lung]
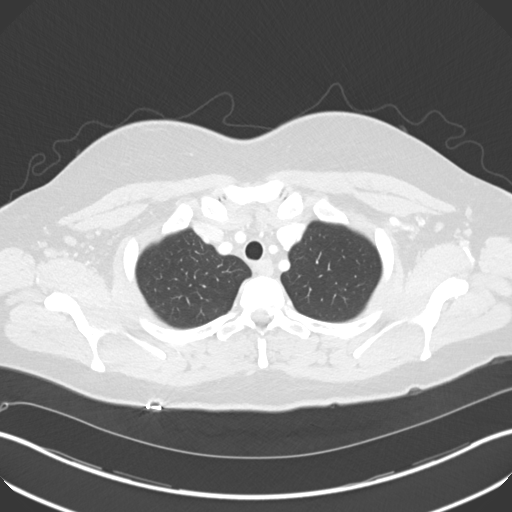
[im 154/166  lung]
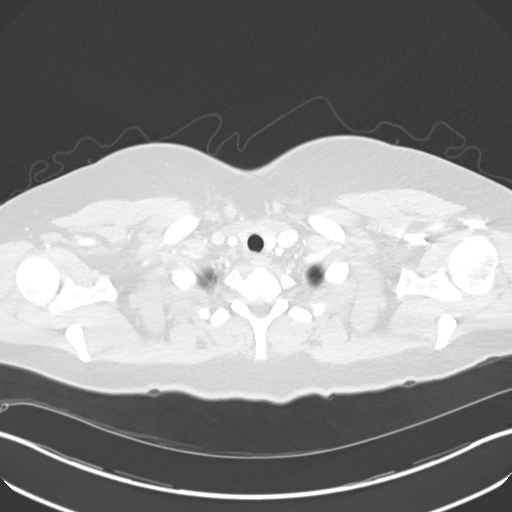

[Series 5: coronal · coronal · 0.74mm/px · 3 of 110 slices shown]
[im 22/110  lung]
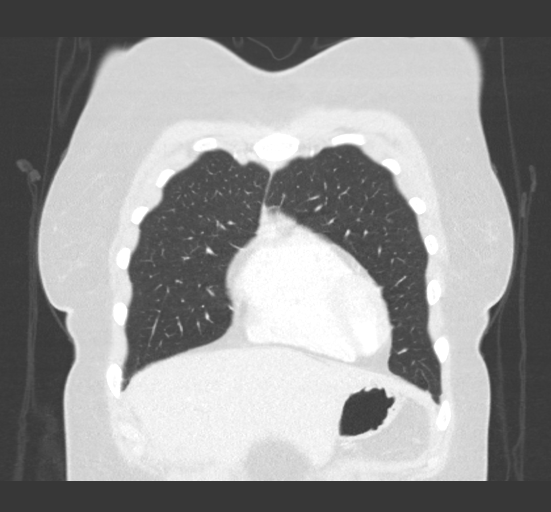
[im 44/110  lung]
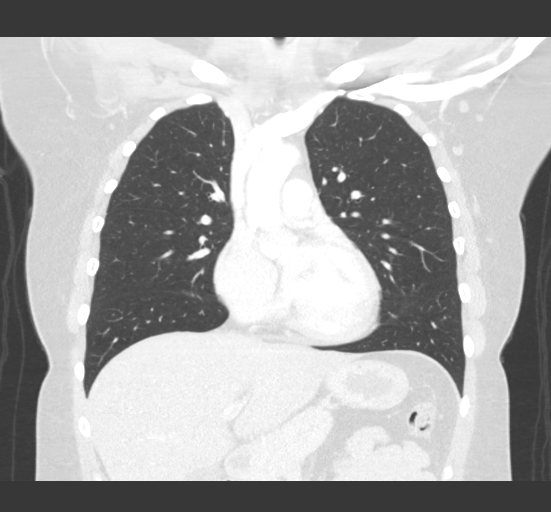
[im 66/110  lung]
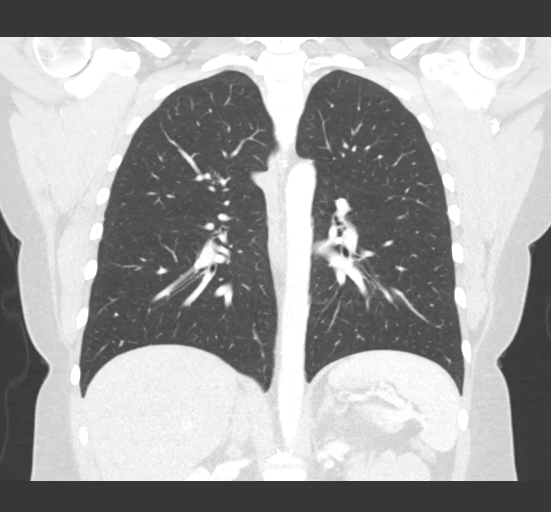

[15 of 36 positions shown; findings below may reference images not displayed]

FINDINGS: Cardiovascular: The heart size is normal. No substantial pericardial
effusion. No thoracic aortic aneurysm.

Mediastinum/Nodes: Interval increase in soft tissue attenuation in
the anterior mediastinum, including a 1.7 x 2.1 cm nodular soft
tissue focus on image 42/series 2. Other areas of nodular soft
tissue in the anterior mediastinum are well demonstrated on 46/2.
Confluent anterior soft tissue is visible on 56/2. This is all
progressive in the interval since 09/17/2018. No lymphadenopathy in
the paratracheal or subcarinal spaces. No hilar lymphadenopathy. The
esophagus has normal imaging features. There is no axillary
lymphadenopathy. No evidence for subpectoral lymphadenopathy.

Lungs/Pleura: 2 mm right upper lobe nodule on 34/7 is stable in the
interval. 2 mm subpleural right middle lobe nodule on 100/7 is
unchanged. No new suspicious pulmonary nodule or mass. No focal
airspace consolidation. No pleural effusion.

Upper Abdomen: Unremarkable.

Musculoskeletal: No worrisome lytic or sclerotic osseous
abnormality.
IMPRESSION: Since the PET-CT of 09/17/2018, there has been progression of soft
tissue in the anterior mediastinum. This may be related to thymic
tissue although areas of the soft tissue are more nodular than
typically seen for thymic remnant/rebound and close attention is
recommended as recurrent lymphoma cannot be excluded. There was some
soft tissue in this region on the prior PET-CT that demonstrated low
level FDG accumulation. Repeat PET-CT may be warranted to further
evaluate.

Stable tiny right lung nodules, most likely benign.

## 2020-01-07 ENCOUNTER — Telehealth: Payer: Self-pay

## 2020-01-07 NOTE — Telephone Encounter (Signed)
Called pt to inform her that her appt for CT is 01/15/20 at 11 am at [facility]. Pt understands she is to be NPO 4 hours before scan and to arrive at 1045.

## 2020-01-15 ENCOUNTER — Ambulatory Visit (HOSPITAL_COMMUNITY): Payer: 59

## 2020-01-15 ENCOUNTER — Inpatient Hospital Stay: Payer: 59 | Attending: Family Medicine

## 2020-01-16 ENCOUNTER — Inpatient Hospital Stay: Payer: 59 | Admitting: Hematology and Oncology

## 2020-01-16 ENCOUNTER — Telehealth: Payer: Self-pay

## 2020-01-16 NOTE — Telephone Encounter (Signed)
TC to Pt. To inquire about missed CT scan appointment no answer, message could not be left because mailbox was full. Also called Pt's significant other left message on voicemail for Pt to return call to Dr. Calton Dach office

## 2020-04-17 ENCOUNTER — Ambulatory Visit (HOSPITAL_COMMUNITY)
Admission: EM | Admit: 2020-04-17 | Discharge: 2020-04-17 | Disposition: A | Discharge status: Home / Self Care | Payer: 59 | Attending: Emergency Medicine | Admitting: Emergency Medicine

## 2020-04-17 ENCOUNTER — Encounter (HOSPITAL_COMMUNITY): Payer: Self-pay

## 2020-04-17 ENCOUNTER — Other Ambulatory Visit: Payer: Self-pay

## 2020-04-17 DIAGNOSIS — Z3202 Encounter for pregnancy test, result negative: Secondary | ICD-10-CM

## 2020-04-17 DIAGNOSIS — R519 Headache, unspecified: Secondary | ICD-10-CM | POA: Insufficient documentation

## 2020-04-17 DIAGNOSIS — Z20822 Contact with and (suspected) exposure to covid-19: Secondary | ICD-10-CM | POA: Diagnosis not present

## 2020-04-17 DIAGNOSIS — R42 Dizziness and giddiness: Secondary | ICD-10-CM | POA: Diagnosis not present

## 2020-04-17 DIAGNOSIS — M546 Pain in thoracic spine: Secondary | ICD-10-CM | POA: Insufficient documentation

## 2020-04-17 DIAGNOSIS — Z87891 Personal history of nicotine dependence: Secondary | ICD-10-CM | POA: Insufficient documentation

## 2020-04-17 DIAGNOSIS — N3090 Cystitis, unspecified without hematuria: Secondary | ICD-10-CM | POA: Insufficient documentation

## 2020-04-17 DIAGNOSIS — N390 Urinary tract infection, site not specified: Secondary | ICD-10-CM

## 2020-04-17 LAB — POCT URINALYSIS DIPSTICK, ED / UC
Bilirubin Urine: NEGATIVE
Glucose, UA: NEGATIVE mg/dL
Ketones, ur: NEGATIVE mg/dL
Nitrite: NEGATIVE
Protein, ur: NEGATIVE mg/dL
Specific Gravity, Urine: 1.015 (ref 1.005–1.030)
Urobilinogen, UA: 0.2 mg/dL (ref 0.0–1.0)
pH: 6.5 (ref 5.0–8.0)

## 2020-04-17 LAB — SARS CORONAVIRUS 2 (TAT 6-24 HRS): SARS Coronavirus 2: NEGATIVE

## 2020-04-17 LAB — POC URINE PREG, ED: Preg Test, Ur: NEGATIVE

## 2020-04-17 MED ORDER — CEPHALEXIN 500 MG PO CAPS: 500.0000 mg | ORAL_CAPSULE | Freq: Two times a day (BID) | ORAL | 0 refills | Status: AC

## 2020-04-17 NOTE — ED Triage Notes (Signed)
Pt c/o 8/10 sharp pain in right back near kidney area. Pt c/o HA, chills, urinary urgencyx3 days.

## 2020-04-17 NOTE — Discharge Instructions (Signed)
I have sent antibiotics to be started as your urine may indicate mild UTI. I have sent for your urine to be cultured to confirm this. If this returns with <100,000 colonies (which you may see on your MyChart) you may stop the antibiotic.  Drink plenty of water to empty bladder regularly. Avoid alcohol and caffeine as these may irritate the bladder.   Your symptoms are also concerning for covid-19 so I do think it is appropriate to screen for this as well.  Self isolate until covid results are back and negative.  Will notify you by phone of any positive findings. Your negative results will be sent through your MyChart.     If symptoms worsen or do not improve in the next week to return to be seen or to follow up with your PCP.

## 2020-04-17 NOTE — ED Provider Notes (Signed)
[facility]    CSN: [id number] Arrival date & time: 04/17/20  0801      History   Chief Complaint Chief Complaint  Patient presents with  . Urinary Tract Infection    HPI Adrienne [name] is a 29 y.o. female.   Adrienne [name] presents with complaints of symptoms which started 3 days ago. "kidneys" started hurting- right sided low -mid back pain. Worsening. Pain with extension of the back. Taking a deep breath can hurt as well. Dizziness with position changes. Weird taste to mouth- like blood. Chills. Urinary urgency, even if just voided. Urinary frequency. Has increased water intake. No pain with urination. Nausea yesterday, no vomiting. No fevers. No vaginal symptoms. Headache. No URI symptoms. No known ill contacts. 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     ROS per HPI, negative if not otherwise mentioned.      Past Medical History:  Diagnosis Date  . Anxiety 2019  . Depression 2016  . Dyspnea 01/2009   with sternal area discomfort, Pt questions anxiety  . Frequent UTI   . Hodgkin's lymphoma ([place]) 02/21/2018  . Hodgkin's lymphoma ([place])   . Neutropenic fever ([place]) 04/18/2018    Patient Active Problem List   Diagnosis Date Noted  . 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 Upper urinary tract infection 09/18/2018  . Rash 06/08/2018  . Hot flashes 05/10/2018  . Skin infection 05/10/2018  . Encounter for antineoplastic chemotherapy 05/02/2018  . 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 Preventive measure 05/02/2018  . Acute sinusitis 04/18/2018  . Chemotherapy induced neutropenia ([place]) 04/06/2018  . Bone pain 04/06/2018  . Chemotherapy-induced nausea 03/16/2018  . 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 Mucositis due to antineoplastic therapy 03/16/2018  . Other constipation 03/16/2018  . 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 Encounter for fertility preservation counseling prior to cancer therapy  02/23/2018  . Other fatigue 02/23/2018  . Anxiety 08/08/2017    Past Surgical History:  Procedure Laterality Date  . INDUCED ABORTION    . LYMPH NODE BIOPSY Right 02/15/2018   Procedure: RIGHT CERVICAL LYMPH NODE EXCISIONAL BIOPSY;  Surgeon: Erroll Luna, MD;  Location: [facility];  Service: General;  Laterality: Right;  . MOUTH SURGERY    . PORTACATH PLACEMENT Left 03/07/2018   Procedure: INSERTION PORT-A-CATH WITH ULTRASOUND;  Surgeon: Erroll Luna, MD;  Location: [facility];  Service: General;  Laterality: Left;  . WRIST SURGERY      OB History    Gravida  2   Para  1   Term  1   Preterm      AB  1   Living  1     SAB      TAB  1   Ectopic      Multiple  0   Live Births  1            Home Medications    Prior to Admission medications   Medication Sig Start Date End Date Taking? Authorizing Provider  cephALEXin (KEFLEX) 500 MG capsule Take 1 capsule (500 mg total) by mouth 2 (two) times daily for 7 days. 04/17/20 04/24/20  Zigmund Gottron, NP  levonorgestrel (MIRENA, 52 MG,) 20 MCG/24HR IUD 1 each by Intrauterine route once.     [provider]    Family History Family History  Problem Relation Age of Onset  . Cancer Paternal Grandmother  lung  . Heart disease Paternal Grandfather        died from heart attack  . Alcohol abuse Neg Hx   . Arthritis Neg Hx   . Asthma Neg Hx   . Birth defects Neg Hx   . COPD Neg Hx   . Depression Neg Hx   . Diabetes Neg Hx   . Drug abuse Neg Hx   . Early death Neg Hx   . Hearing loss Neg Hx   . Hyperlipidemia Neg Hx   . Hypertension Neg Hx   . Kidney disease Neg Hx   . Learning disabilities Neg Hx   . Mental illness Neg Hx   . Mental retardation Neg Hx   . Miscarriages / Stillbirths Neg Hx   . Stroke Neg Hx   . Vision loss Neg Hx   . Varicose Veins Neg Hx     Social History Social History   Tobacco Use  . Smoking status: Former Research scientist (life sciences)  . Smokeless  tobacco: Never Used  . Tobacco comment: 2015  Vaping Use  . Vaping Use: Never used  Substance Use Topics  . Alcohol use: Yes    Alcohol/week: 0.0 standard drinks    Comment: socially   . Drug use: No    Comment: Hx of drug abuse- per pt in previous ED notes     Allergies   Patient has no known allergies.   Review of Systems Review of Systems   Physical Exam Triage Vital Signs ED Triage Vitals [04/17/20 0815]  Enc Vitals Group     BP 103/71     Pulse Rate 77     Resp 16     Temp 98.4 F (36.9 C)     Temp Source Oral     SpO2 98 %     Weight 170 lb (77.1 kg)     Height 5\' 9"  (1.753 m)     Head Circumference      Peak Flow      Pain Score 8     Pain Loc      Pain Edu?      Excl. in [place]?    No data found.  Updated Vital Signs BP 103/71   Pulse 77   Temp 98.4 F (36.9 C) (Oral)   Resp 16   Ht 5\' 9"  (1.753 m)   Wt 170 lb (77.1 kg)   SpO2 98%   BMI 25.10 kg/m   Visual Acuity Right Eye Distance:   Left Eye Distance:   Bilateral Distance:    Right Eye Near:   Left Eye Near:    Bilateral Near:     Physical Exam Constitutional:      General: She is not in acute distress.    Appearance: She is well-developed.  HENT:     Head: Normocephalic and atraumatic.  Cardiovascular:     Rate and Rhythm: Normal rate.  Pulmonary:     Effort: Pulmonary effort is normal.  Abdominal:     Tenderness: There is no abdominal tenderness. There is left CVA tenderness.  Skin:    General: Skin is warm and dry.  Neurological:     Mental Status: She is alert and oriented to person, place, and time.      UC Treatments / Results  Labs (all labs ordered are listed, but only abnormal results are displayed) Labs Reviewed  POCT URINALYSIS DIPSTICK, ED / UC - Abnormal; Notable for the following components:      Result Value  Hgb urine dipstick MODERATE (*)    Leukocytes,Ua SMALL (*)    All other components within normal limits  URINE CULTURE  SARS CORONAVIRUS 2 (TAT  6-24 HRS)  POC URINE PREG, ED    EKG   Radiology No results found.  Procedures Procedures (including critical care time)  Medications Ordered in UC Medications - No data to display  Initial Impression / Assessment and Plan / UC Course  I have reviewed the triage vital signs and the nursing notes.  Pertinent labs & imaging results that were available during my care of the patient were reviewed by me and considered in my medical decision making (see chart for details).     Afebrile. No tachycardia. leuks and hgb to urine with urinary urgency. Culture obtained with antibiotics initiated. Also with headache, body aches- pyelo considered, covid also considered with testing collected. Return precautions provided. Patient verbalized understanding and agreeable to plan.   Final Clinical Impressions(s) / UC Diagnoses   Final diagnoses:  Cystitis  Acute nonintractable headache, unspecified headache type     Discharge Instructions     I have sent antibiotics to be started as your urine may indicate mild UTI. I have sent for your urine to be cultured to confirm this. If this returns with <100,000 colonies (which you may see on your MyChart) you may stop the antibiotic.  Drink plenty of water to empty bladder regularly. Avoid alcohol and caffeine as these may irritate the bladder.   Your symptoms are also concerning for covid-19 so I do think it is appropriate to screen for this as well.  Self isolate until covid results are back and negative.  Will notify you by phone of any positive findings. Your negative results will be sent through your MyChart.     If symptoms worsen or do not improve in the next week to return to be seen or to follow up with your PCP.      ED Prescriptions    Medication Sig Dispense Auth. Provider   cephALEXin (KEFLEX) 500 MG capsule Take 1 capsule (500 mg total) by mouth 2 (two) times daily for 7 days. 14 capsule Zigmund Gottron, NP     PDMP not reviewed  this encounter.   Zigmund Gottron, NP 04/17/20 [phone]

## 2020-04-19 LAB — URINE CULTURE: Culture: 70000 — AB

## 2021-01-22 ENCOUNTER — Other Ambulatory Visit: Payer: Self-pay | Admitting: Ophthalmology

## 2021-01-22 ENCOUNTER — Ambulatory Visit
Admission: RE | Admit: 2021-01-22 | Discharge: 2021-01-22 | Disposition: A | Discharge status: Home / Self Care | Payer: PRIVATE HEALTH INSURANCE | Source: Ambulatory Visit | Attending: Ophthalmology | Admitting: Ophthalmology

## 2021-01-22 DIAGNOSIS — H209 Unspecified iridocyclitis: Secondary | ICD-10-CM

## 2021-06-17 ENCOUNTER — Ambulatory Visit: Admission: EM | Admit: 2021-06-17 | Discharge: 2021-06-17 | Discharge status: Against Medical Advice | Payer: 59

## 2021-07-06 ENCOUNTER — Ambulatory Visit: Payer: 59 | Admitting: Internal Medicine

## 2021-08-05 ENCOUNTER — Encounter: Payer: Self-pay | Admitting: Physician Assistant

## 2021-08-05 ENCOUNTER — Other Ambulatory Visit: Payer: Self-pay

## 2021-08-05 ENCOUNTER — Ambulatory Visit
Admission: EM | Admit: 2021-08-05 | Discharge: 2021-08-05 | Disposition: A | Discharge status: Home / Self Care | Payer: 59 | Attending: Physician Assistant | Admitting: Physician Assistant

## 2021-08-05 DIAGNOSIS — J069 Acute upper respiratory infection, unspecified: Secondary | ICD-10-CM

## 2021-08-05 NOTE — ED Triage Notes (Signed)
Became hoarse 3 days ago, with cough, chills, headache, diarrhea, vomiting. Denies body aches.

## 2021-08-05 NOTE — ED Provider Notes (Signed)
[facility]    CSN: [id number] Arrival date & time: 08/05/21  1146      History   Chief Complaint Chief Complaint  Patient presents with   Cough    HPI Adrienne [name] is a 30 y.o. female.   Patient here today for evaluation of nasal congestion, cough, chills, headache, diarrhea that started 3 days ago. She has not had body aches, nausea or vomiting. She states her boss recently came into work with covid.   The history is provided by the patient.   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   Preventive measure 05/02/2018   Acute sinusitis 04/18/2018   Chemotherapy induced neutropenia ([place]) 04/06/2018   Bone pain 04/06/2018   Chemotherapy-induced nausea 03/16/2018   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   Mucositis due to antineoplastic therapy 03/16/2018   Other constipation 03/16/2018   Classical Hodgkin lymphoma ([place]) 02/23/2018   Encounter for fertility preservation counseling prior to cancer therapy 02/23/2018   Other fatigue 02/23/2018   Anxiety 08/08/2017    Past Surgical History:  Procedure Laterality Date   INDUCED ABORTION     LYMPH NODE BIOPSY Right 02/15/2018   Procedure: RIGHT CERVICAL LYMPH NODE EXCISIONAL BIOPSY;  Surgeon: Erroll Luna, MD;  Location: [facility];  Service: General;  Laterality: Right;   MOUTH SURGERY     PORTACATH PLACEMENT Left 03/07/2018    Procedure: INSERTION PORT-A-CATH WITH ULTRASOUND;  Surgeon: Erroll Luna, MD;  Location: [facility];  Service: General;  Laterality: Left;   WRIST SURGERY      OB History     Gravida  2   Para  1   Term  1   Preterm      AB  1   Living  1      SAB      IAB  1   Ectopic      Multiple  0   Live Births  1            Home Medications    Prior to Admission medications   Medication Sig Start Date End Date Taking? Authorizing Provider  levonorgestrel (MIRENA, 52 MG,) 20 MCG/24HR IUD 1 each by Intrauterine route once.     [provider]    Family History Family History  Problem Relation Age of Onset   Cancer Paternal Grandmother        lung   Heart disease Paternal Grandfather        died from heart attack   Alcohol abuse Neg Hx    Arthritis Neg Hx    Asthma Neg Hx    Birth defects Neg Hx    COPD Neg Hx    Depression Neg Hx    Diabetes Neg Hx    Drug abuse Neg Hx    Early death Neg Hx    Hearing loss Neg Hx  Hyperlipidemia Neg Hx    Hypertension Neg Hx    Kidney disease Neg Hx    Learning disabilities Neg Hx    Mental illness Neg Hx    Mental retardation Neg Hx    Miscarriages / Stillbirths Neg Hx    Stroke Neg Hx    Vision loss Neg Hx    Varicose Veins Neg Hx     Social History Social History   Tobacco Use   Smoking status: Former   Smokeless tobacco: Never   Tobacco comments:    2015  Vaping Use   Vaping Use: Never used  Substance Use Topics   Alcohol use: Yes    Alcohol/week: 0.0 standard drinks    Comment: socially    Drug use: No    Comment: Hx of drug abuse- per pt in previous ED notes     Allergies   Patient has no known allergies.   Review of Systems Review of Systems  Constitutional:  Positive for chills.  HENT:  Positive for congestion and sore throat. Negative for ear pain.   Eyes:  Negative for discharge and redness.  Respiratory:  Positive for cough. Negative for shortness of breath  and wheezing.   Gastrointestinal:  Positive for diarrhea. Negative for abdominal pain, nausea and vomiting.  Musculoskeletal:  Positive for myalgias.    Physical Exam Triage Vital Signs ED Triage Vitals  Enc Vitals Group     BP      Pulse      Resp      Temp      Temp src      SpO2      Weight      Height      Head Circumference      Peak Flow      Pain Score      Pain Loc      Pain Edu?      Excl. in [place]?    No data found.  Updated Vital Signs BP 114/72 (BP Location: Left Arm)    Pulse 75    Temp 98.4 F (36.9 C) (Oral)    Resp 16    SpO2 95%      Physical Exam Vitals and nursing note reviewed.  Constitutional:      General: She is not in acute distress.    Appearance: Normal appearance. She is not ill-appearing.  HENT:     Head: Normocephalic and atraumatic.     Right Ear: Tympanic membrane normal.     Left Ear: Tympanic membrane normal.     Nose: Congestion present.     Mouth/Throat:     Mouth: Mucous membranes are moist.     Pharynx: No oropharyngeal exudate or posterior oropharyngeal erythema.  Eyes:     Conjunctiva/sclera: Conjunctivae normal.  Cardiovascular:     Rate and Rhythm: Normal rate and regular rhythm.     Heart sounds: Normal heart sounds. No murmur heard. Pulmonary:     Effort: Pulmonary effort is normal. No respiratory distress.     Breath sounds: Normal breath sounds. No wheezing, rhonchi or rales.  Skin:    General: Skin is warm and dry.  Neurological:     Mental Status: She is alert.  Psychiatric:        Mood and Affect: Mood normal.        Thought Content: Thought content normal.     UC Treatments / Results  Labs (all labs ordered are listed, but only abnormal results are  displayed) Labs Reviewed  COVID-19, FLU A+B NAA    EKG   Radiology No results found.  Procedures Procedures (including critical care time)  Medications Ordered in UC Medications - No data to display  Initial Impression / Assessment and Plan / UC  Course  I have reviewed the triage vital signs and the nursing notes.  Pertinent labs & imaging results that were available during my care of the patient were reviewed by me and considered in my medical decision making (see chart for details).    Suspect viral etiology of symptoms. Will screen for covid and flu. Recommend follow up with any further concerns.   Final Clinical Impressions(s) / UC Diagnoses   Final diagnoses:  Acute upper respiratory infection   Discharge Instructions   None    ED Prescriptions   None    PDMP not reviewed this encounter.   Francene Finders, PA-C 08/05/21 1217

## 2021-08-06 LAB — COVID-19, FLU A+B NAA
Influenza A, NAA: NOT DETECTED
Influenza B, NAA: NOT DETECTED
SARS-CoV-2, NAA: NOT DETECTED

## 2022-06-09 DIAGNOSIS — J00 Acute nasopharyngitis [common cold]: Secondary | ICD-10-CM | POA: Diagnosis not present

## 2022-06-09 DIAGNOSIS — Z20822 Contact with and (suspected) exposure to covid-19: Secondary | ICD-10-CM | POA: Diagnosis not present

## 2022-07-03 DIAGNOSIS — F418 Other specified anxiety disorders: Secondary | ICD-10-CM | POA: Diagnosis not present

## 2022-07-17 DIAGNOSIS — F418 Other specified anxiety disorders: Secondary | ICD-10-CM | POA: Diagnosis not present

## 2022-07-25 DIAGNOSIS — F418 Other specified anxiety disorders: Secondary | ICD-10-CM | POA: Diagnosis not present

## 2022-11-11 ENCOUNTER — Telehealth: Payer: Self-pay | Admitting: Hematology and Oncology

## 2022-11-11 DIAGNOSIS — N921 Excessive and frequent menstruation with irregular cycle: Secondary | ICD-10-CM | POA: Diagnosis not present

## 2022-11-11 DIAGNOSIS — S0990XA Unspecified injury of head, initial encounter: Secondary | ICD-10-CM | POA: Diagnosis not present

## 2022-11-11 DIAGNOSIS — Z8572 Personal history of non-Hodgkin lymphomas: Secondary | ICD-10-CM | POA: Diagnosis not present

## 2022-11-11 NOTE — Telephone Encounter (Signed)
Patient aware of upcoming appointments  

## 2022-11-14 ENCOUNTER — Telehealth: Payer: Self-pay | Admitting: Hematology and Oncology

## 2022-11-14 NOTE — Telephone Encounter (Signed)
Patient called to reschedule her appointments, patient aware of time and date change.

## 2022-11-21 DIAGNOSIS — Z6826 Body mass index (BMI) 26.0-26.9, adult: Secondary | ICD-10-CM | POA: Diagnosis not present

## 2022-11-21 DIAGNOSIS — Z01419 Encounter for gynecological examination (general) (routine) without abnormal findings: Secondary | ICD-10-CM | POA: Diagnosis not present

## 2022-11-21 DIAGNOSIS — N760 Acute vaginitis: Secondary | ICD-10-CM | POA: Diagnosis not present

## 2022-11-21 DIAGNOSIS — Z124 Encounter for screening for malignant neoplasm of cervix: Secondary | ICD-10-CM | POA: Diagnosis not present

## 2022-11-22 ENCOUNTER — Other Ambulatory Visit: Payer: 59

## 2022-11-22 ENCOUNTER — Ambulatory Visit: Payer: 59 | Admitting: Hematology and Oncology

## 2022-12-05 ENCOUNTER — Telehealth: Payer: Self-pay | Admitting: Hematology and Oncology

## 2022-12-05 NOTE — Telephone Encounter (Signed)
patient left a voceiamil to verify her time and date of appointment.

## 2022-12-06 ENCOUNTER — Other Ambulatory Visit: Payer: 59

## 2022-12-06 ENCOUNTER — Other Ambulatory Visit: Payer: Self-pay

## 2022-12-06 ENCOUNTER — Telehealth: Payer: Self-pay

## 2022-12-06 ENCOUNTER — Ambulatory Visit: Payer: 59 | Admitting: Hematology and Oncology

## 2022-12-06 ENCOUNTER — Inpatient Hospital Stay: Payer: BC Managed Care – PPO | Attending: Hematology and Oncology | Admitting: Hematology and Oncology

## 2022-12-06 ENCOUNTER — Encounter: Payer: Self-pay | Admitting: Hematology and Oncology

## 2022-12-06 ENCOUNTER — Inpatient Hospital Stay: Payer: BC Managed Care – PPO

## 2022-12-06 VITALS — BP 109/65 | HR 58 | Temp 99.1°F | Resp 18 | Ht 69.0 in | Wt 172.8 lb

## 2022-12-06 DIAGNOSIS — C8170 Other classical Hodgkin lymphoma, unspecified site: Secondary | ICD-10-CM

## 2022-12-06 DIAGNOSIS — N939 Abnormal uterine and vaginal bleeding, unspecified: Secondary | ICD-10-CM

## 2022-12-06 DIAGNOSIS — D539 Nutritional anemia, unspecified: Secondary | ICD-10-CM

## 2022-12-06 DIAGNOSIS — Z8571 Personal history of Hodgkin lymphoma: Secondary | ICD-10-CM | POA: Insufficient documentation

## 2022-12-06 DIAGNOSIS — N921 Excessive and frequent menstruation with irregular cycle: Secondary | ICD-10-CM | POA: Diagnosis not present

## 2022-12-06 DIAGNOSIS — R634 Abnormal weight loss: Secondary | ICD-10-CM | POA: Diagnosis not present

## 2022-12-06 LAB — VITAMIN B12: Vitamin B-12: 504 pg/mL (ref 180–914)

## 2022-12-06 LAB — IRON AND IRON BINDING CAPACITY (CC-WL,HP ONLY)
Iron: 116 ug/dL (ref 28–170)
Saturation Ratios: 33 % — ABNORMAL HIGH (ref 10.4–31.8)
TIBC: 354 ug/dL (ref 250–450)
UIBC: 238 ug/dL (ref 148–442)

## 2022-12-06 LAB — CMP (CANCER CENTER ONLY)
ALT: 14 U/L (ref 0–44)
AST: 17 U/L (ref 15–41)
Albumin: 4.9 g/dL (ref 3.5–5.0)
Alkaline Phosphatase: 41 U/L (ref 38–126)
Anion gap: 8 (ref 5–15)
BUN: 9 mg/dL (ref 6–20)
CO2: 26 mmol/L (ref 22–32)
Calcium: 10.6 mg/dL — ABNORMAL HIGH (ref 8.9–10.3)
Chloride: 105 mmol/L (ref 98–111)
Creatinine: 0.86 mg/dL (ref 0.44–1.00)
GFR, Estimated: >60 mL/min (ref >60)
Glucose, Bld: 82 mg/dL (ref 70–99)
Potassium: 3.7 mmol/L (ref 3.5–5.1)
Sodium: 139 mmol/L (ref 135–145)
Total Bilirubin: 1.5 mg/dL — ABNORMAL HIGH (ref 0.3–1.2)
Total Protein: 7.9 g/dL (ref 6.5–8.1)

## 2022-12-06 LAB — CBC WITH DIFFERENTIAL (CANCER CENTER ONLY)
Abs Immature Granulocytes: 0.01 10*3/uL (ref 0.00–0.07)
Basophils Absolute: 0.1 10*3/uL (ref 0.0–0.1)
Basophils Relative: 1 %
Eosinophils Absolute: 0.2 10*3/uL (ref 0.0–0.5)
Eosinophils Relative: 4 %
HCT: 41.6 % (ref 36.0–46.0)
Hemoglobin: 13.8 g/dL (ref 12.0–15.0)
Immature Granulocytes: 0 %
Lymphocytes Relative: 23 %
Lymphs Abs: 1.2 10*3/uL (ref 0.7–4.0)
MCH: 30 pg (ref 26.0–34.0)
MCHC: 33.2 g/dL (ref 30.0–36.0)
MCV: 90.4 fL (ref 80.0–100.0)
Monocytes Absolute: 0.4 10*3/uL (ref 0.1–1.0)
Monocytes Relative: 9 %
Neutro Abs: 3.2 10*3/uL (ref 1.7–7.7)
Neutrophils Relative %: 63 %
Platelet Count: 261 10*3/uL (ref 150–400)
RBC: 4.6 MIL/uL (ref 3.87–5.11)
RDW: 12.5 % (ref 11.5–15.5)
WBC Count: 5.1 10*3/uL (ref 4.0–10.5)
nRBC: 0 % (ref 0.0–0.2)

## 2022-12-06 LAB — PROTIME-INR
INR: 1 (ref 0.8–1.2)
Prothrombin Time: 13.4 seconds (ref 11.4–15.2)

## 2022-12-06 LAB — APTT: aPTT: 30 seconds (ref 24–36)

## 2022-12-06 LAB — RETICULOCYTES
Immature Retic Fract: 3.9 % (ref 2.3–15.9)
RBC.: 4.52 MIL/uL (ref 3.87–5.11)
Retic Count, Absolute: 27.6 10*3/uL (ref 19.0–186.0)
Retic Ct Pct: 0.6 % (ref 0.4–3.1)

## 2022-12-06 LAB — TSH: TSH: 0.75 u[IU]/mL (ref 0.350–4.500)

## 2022-12-06 LAB — FERRITIN: Ferritin: 99 ng/mL (ref 11–307)

## 2022-12-06 LAB — PREGNANCY, URINE: Preg Test, Ur: NEGATIVE

## 2022-12-06 LAB — SEDIMENTATION RATE: Sed Rate: 3 mm/hr (ref 0–22)

## 2022-12-06 LAB — FIBRINOGEN: Fibrinogen: 362 mg/dL (ref 210–475)

## 2022-12-06 NOTE — Telephone Encounter (Signed)
It is for 1 week from today

## 2022-12-06 NOTE — Assessment & Plan Note (Addendum)
She has persistent menorrhagia for a very long time She was prescribed estrogen pill that seems to help She have history of postcoital bleeding I recommend ordering blood work, iron studies, coag studies and imaging studies I recommend avoidance of NSAID or aspirin containing products

## 2022-12-06 NOTE — Progress Notes (Signed)
[name] FOLLOW-UP progress notes  Patient Care Team: Laurann Montana, MD as PCP - General (Family Medicine)  CHIEF COMPLAINTS/PURPOSE OF VISIT:  History of Hodgkin lymphoma, weight loss, uncontrolled heavy menstruation  HISTORY OF PRESENTING ILLNESS:  Adrienne [name] 32 y.o. female is billed as a new patient today.  She has not been seen since 2021. She have history of Hodgkin lymphoma status posttreatment Approximately 2 months ago, she started to have continuous menorrhagia.  She have daily heavy menstruation She have history of postcoital bleeding.  She had IUD placed 7 years ago She was evaluated by gynecologist recently, Pap smear is pending She was placed on estradiol which seems to help to control it somewhat Prior to that, she was prescribed ibuprofen without benefit She denies new lymphadenopathy She has lost a lot of weight over the years due to dietary modification The patient denies any recent signs or symptoms of bleeding such as spontaneous epistaxis, hematuria or hematochezia. Denies abnormal night sweats  I reviewed the patient's records extensive and collaborated the history with the patient. Summary of her history is as follows: Oncology History  Classical Hodgkin lymphoma (HCC)  01/22/2018 Imaging   CT scan of neck: Adenopathy throughout the right lateral neck, right supraclavicular fossa, and upper mediastinum. There is no specific imaging feature, lymphoma or atypical infection are the primary considerations and excisional biopsy should be considered if there is no diagnosis by history or labs.     01/31/2018 Pathology Results   Lymph node for lymphoma, right supraclavicular / right cervical - ATYPICAL LYMPHOID PROLIFERATION. - SEE COMMENT. Microscopic Comment Sections show a few very small needle core biopsy fragments of lymph nodal tissue displaying a polymorphous cellular proliferation of small lymphocytes, plasma cells, eosinophils and  histiocytes in addition to variable numbers of large mononuclear and multilobated lymphoid appearing cells with variably prominent nucleoli. Flow cytometric was performed ([id number]) and failed to show any monoclonal B-cell population or abnormal T-cell phenotype. Immunohistochemical stains were performed, including CD15, CD20, CD3, CD30, LCA, and PAX-5 with appropriate controls. The large atypical lymphoid appearing cells are positive for CD15, CD30, CD20 and PAX-5 and negative for CD3 and LCA. The small lymphoid component in the background is mostly composed of T cells. The morphologic and phenotypic features are atypical and highly suspicious for involvement by a lymphoproliferative process, particularly classical Hodgkin lymphoma. Excisional biopsy is recommended.   01/31/2018 Procedure   Ultrasound-guided core biopsies of right cervical lymph nodes.   02/15/2018 Pathology Results   Lymph node for lymphoma, Right Cervical - CLASSICAL HODGKIN LYMPHOMA, NODULAR SCLEROSIS TYPE   03/05/2018 PET scan   1. Hypermetabolic RIGHT level 2 lymph nodes, supraclavicular lymph nodes, sub pectoralis lymph nodes and RIGHT axillary lymph nodes. 2. Hypermetabolic mediastinal lymph nodes. 3. No evidence of lymphoma beneath the diaphragms.  Normal spleen. 4. Normal bone marrow.   03/05/2018 Imaging   LV EF: 55% -   60%   03/06/2018 Cancer Staging   Staging form: Hodgkin and Non-Hodgkin Lymphoma, AJCC 8th Edition - Clinical stage from 03/06/2018: Stage II (Hodgkin lymphoma, B - Symptoms) - Signed by Artis Delay, MD on 03/06/2018   03/08/2018 - 08/17/2018 Chemotherapy   The patient had ABVD for chemotherapy treatment. After interim scan showed complete response, Bleomycin was discontinued    05/01/2018 PET scan   1. Near complete resolution of metabolic activity within RIGHT cervical lymph nodes, mediastinal nodes, RIGHT supraclavicular nodes, and axillary nodes ( Deauville 1 and 2). 2. No evidence lymphoma  progression. 3. Normal spleen and marrow. 4. Extensive hypermetabolic brown fat within the neck and thorax makes evaluation challenging.   06/05/2018 Echocardiogram   LV EF: 60% -   65%   09/03/2018 Imaging   No acute abnormality identified in the abdomen or pelvis.   09/17/2018 PET scan   1. Faintly accentuated thymic activity with maximum SUV 4.3, which is at the Deauville 4 level. However, this may simply represent rebound or response to therapy, there is no masslike appearance in the anterior mediastinum. 2. Accentuated bilateral palatine tonsillar activity without CT correlate, probably physiologic/incidental. 3. Otherwise no accentuated activity or pathologic enlarged adenopathy in the neck, chest, abdomen, or pelvis to suggest active disease. This would correspond to Deauville 1 response. 4. Chronic ethmoid and bilateral maxillary sinusitis.     04/11/2019 Imaging   Ct scan of chest Since the PET-CT of 09/17/2018, there has been progression of soft tissue in the anterior mediastinum. This may be related to thymic tissue although areas of the soft tissue are more nodular than typically seen for thymic remnant/rebound and close attention is recommended as recurrent lymphoma cannot be excluded. There was some soft tissue in this region on the prior PET-CT that demonstrated low level FDG accumulation. Repeat PET-CT may be warranted to further evaluate.   Stable tiny right lung nodules, most likely benign.   04/19/2019 PET scan   IMPRESSION: 1. Interval increase in triangular-shaped area of increased soft tissue within the anterior mediastinum with corresponding FDG uptake of 5.28. 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 Recurrent lymphoma is less favored. 2. Small soft tissue nodule which appears separate from the thymic gland within the superior mediastinum/prevascular region with SUV max of 4.13. Previously 1.61. Although technically borderline Deauville criteria 4,  this is only minimally above liver activity. Suggest close interval follow-up.   08/14/2019 PET scan   1. 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 2. Small soft tissue nodule, potentially small lymph node or even thymic tissue also diminished in size with decreased FDG uptake since the previous study. 3. 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 Continued follow-up may be helpful.     MEDICAL HISTORY:  Past Medical History:  Diagnosis Date   Anxiety 2019   Depression 2016   Dyspnea 01/2009   with sternal area discomfort, Pt questions anxiety   Frequent UTI    Hodgkin's lymphoma (HCC) 02/21/2018   Hodgkin's lymphoma (HCC)    Neutropenic fever (HCC) 04/18/2018    SURGICAL HISTORY: Past Surgical History:  Procedure Laterality Date   INDUCED ABORTION     LYMPH NODE BIOPSY Right 02/15/2018   Procedure: RIGHT CERVICAL LYMPH NODE EXCISIONAL BIOPSY;  Surgeon: Harriette Bouillon, MD;  Location: [facility];  Service: General;  Laterality: Right;   MOUTH SURGERY     PORTACATH PLACEMENT Left 03/07/2018   Procedure: INSERTION PORT-A-CATH WITH ULTRASOUND;  Surgeon: Harriette Bouillon, MD;  Location: [facility];  Service: General;  Laterality: Left;   WRIST SURGERY      SOCIAL HISTORY: Social History   Socioeconomic History   Marital status: Married    Spouse name: Helaine Chess   Number of children: 1   Years of education: Not on file   Highest education level: Not on  file  Occupational History   Occupation: sales  Tobacco Use   Smoking status: Former   Smokeless tobacco: Never   Tobacco comments:    2015  Vaping Use   Vaping Use: Never used  Substance and Sexual Activity    Alcohol use: Yes    Alcohol/week: 0.0 standard drinks of alcohol    Comment: socially    Drug use: No    Comment: Hx of drug abuse- per pt in previous ED notes   Sexual activity: Yes    Birth control/protection: None  Other Topics Concern   Not on file  Social History Narrative   Not on file   Social Determinants of Health   Financial Resource Strain: Not on file  Food Insecurity: Not on file  Transportation Needs: Not on file  Physical Activity: Not on file  Stress: Not on file  Social Connections: Not on file  Intimate Partner Violence: Not on file    FAMILY HISTORY: Family History  Problem Relation Age of Onset   Cancer Paternal Grandmother        lung   Heart disease Paternal Grandfather        died from heart attack   Alcohol abuse Neg Hx    Arthritis Neg Hx    Asthma Neg Hx    Birth defects Neg Hx    COPD Neg Hx    Depression Neg Hx    Diabetes Neg Hx    Drug abuse Neg Hx    Early death Neg Hx    Hearing loss Neg Hx    Hyperlipidemia Neg Hx    Hypertension Neg Hx    Kidney disease Neg Hx    Learning disabilities Neg Hx    Mental illness Neg Hx    Mental retardation Neg Hx    Miscarriages / Stillbirths Neg Hx    Stroke Neg Hx    Vision loss Neg Hx    Varicose Veins Neg Hx     ALLERGIES:  has No Known Allergies.  MEDICATIONS:  Current Outpatient Medications  Medication Sig Dispense Refill   estradiol (ESTRACE) 2 MG tablet Take 2 mg by mouth daily.     levonorgestrel (MIRENA, 52 MG,) 20 MCG/24HR IUD 1 each by Intrauterine route once.      No current facility-administered medications for this visit.    REVIEW OF SYSTEMS:   Constitutional: Denies fevers, chills or abnormal night sweats Eyes: Denies blurriness of vision, double vision or watery eyes Ears, nose, mouth, throat, and face: Denies mucositis or sore throat Respiratory: Denies cough, dyspnea or wheezes Cardiovascular: Denies palpitation, chest discomfort or lower extremity  swelling Gastrointestinal:  Denies nausea, heartburn or change in bowel habits Skin: Denies abnormal skin rashes Lymphatics: Denies new lymphadenopathy or easy bruising Neurological:Denies numbness, tingling or new weaknesses Behavioral/Psych: Mood is stable, no new changes  All other systems were reviewed with the patient and are negative.  PHYSICAL EXAMINATION: ECOG PERFORMANCE STATUS: 1 - Symptomatic but completely ambulatory  Vitals:   12/06/22 0810  BP: 109/65  Pulse: (!) 58  Resp: 18  Temp: 99.1 F (37.3 C)  SpO2: 100%   Filed Weights   12/06/22 0810  Weight: 172 lb 12.8 oz (78.4 kg)    GENERAL:alert, no distress and comfortable SKIN: skin color, texture, turgor are normal, no rashes or significant lesions EYES: normal, conjunctiva are pink and non-injected, sclera clear OROPHARYNX:no exudate, normal lips, buccal mucosa, and tongue  NECK: supple, thyroid normal size, non-tender, without nodularity LYMPH:  no palpable lymphadenopathy in the cervical, axillary or inguinal LUNGS: clear to auscultation and percussion with normal breathing effort HEART: regular rate & rhythm and no murmurs without lower extremity edema ABDOMEN:abdomen soft, non-tender and normal bowel sounds Musculoskeletal:no cyanosis of digits and no clubbing  PSYCH: alert & oriented x 3 with fluent speech NEURO: no focal motor/sensory deficits  LABORATORY DATA:  I have reviewed the data as listed Lab Results  Component Value Date   WBC 5.1 12/06/2022   HGB 13.8 12/06/2022   HCT 41.6 12/06/2022   MCV 90.4 12/06/2022   PLT 261 12/06/2022   Recent Labs    12/06/22 0906  NA 139  K 3.7  CL 105  CO2 26  GLUCOSE 82  BUN 9  CREATININE 0.86  CALCIUM 10.6*  GFRNONAA >60  PROT 7.9  ALBUMIN 4.9  AST 17  ALT 14  ALKPHOS 41  BILITOT 1.5*    RADIOGRAPHIC STUDIES: I have personally reviewed the radiological images as listed and agreed with the findings in the report. No results  found.  ASSESSMENT & PLAN:  Classical Hodgkin lymphoma (HCC) The patient was lost to follow-up She has lost a lot of weight Due to ongoing uterine bleeding, I recommend CT imaging to rule out cancer recurrence and she is in agreement  Menorrhagia with irregular cycle She has persistent menorrhagia for a very long time She was prescribed estrogen pill that seems to help She have history of postcoital bleeding I recommend ordering blood work, iron studies, coag studies and imaging studies I recommend avoidance of NSAID or aspirin containing products  Orders Placed This Encounter  Procedures   CT CHEST ABDOMEN PELVIS W CONTRAST    Standing Status:   Future    Standing Expiration Date:   12/06/2023    Order Specific Question:   If indicated for the ordered procedure, I authorize the administration of contrast media per Radiology protocol    Answer:   Yes    Order Specific Question:   Does the patient have a contrast media/X-ray dye allergy?    Answer:   No    Order Specific Question:   Is patient pregnant?    Answer:   No    Order Specific Question:   Preferred imaging location?    Answer:   [facility]    Order Specific Question:   If indicated for the ordered procedure, I authorize the administration of oral contrast media per Radiology protocol    Answer:   Yes   CBC with Differential (Cancer Center Only)    Standing Status:   Future    Number of Occurrences:   1    Standing Expiration Date:   12/06/2023   CMP (Cancer Center only)    Standing Status:   Future    Number of Occurrences:   1    Standing Expiration Date:   12/06/2023   Iron and Iron Binding Capacity ([facility] only)    Standing Status:   Future    Number of Occurrences:   1    Standing Expiration Date:   12/06/2023   Ferritin    Standing Status:   Future    Number of Occurrences:   1    Standing Expiration Date:   12/06/2023   TSH    Standing Status:   Future    Number of Occurrences:   1    Standing  Expiration Date:   12/06/2023   Sedimentation rate    Standing Status:  Future    Number of Occurrences:   1    Standing Expiration Date:   12/06/2023   Reticulocytes    Standing Status:   Future    Number of Occurrences:   1    Standing Expiration Date:   12/06/2023   Vitamin B12    Standing Status:   Future    Number of Occurrences:   1    Standing Expiration Date:   12/06/2023   APTT    Standing Status:   Future    Number of Occurrences:   1    Standing Expiration Date:   12/06/2023   Protime-INR    Standing Status:   Future    Number of Occurrences:   1    Standing Expiration Date:   12/06/2023   Fibrinogen    Standing Status:   Future    Number of Occurrences:   1    Standing Expiration Date:   12/06/2023   Pregnancy, urine    Standing Status:   Future    Number of Occurrences:   1    Standing Expiration Date:   12/06/2023    All questions were answered. The patient knows to call the clinic with any problems, questions or concerns. The total time spent in the appointment was 60 minutes encounter with patients including review of chart and various tests results, discussions about plan of care and coordination of care plan   Artis Delay, MD 12/06/2022 6:27 PM

## 2022-12-06 NOTE — Assessment & Plan Note (Signed)
The patient was lost to follow-up She has lost a lot of weight Due to ongoing uterine bleeding, I recommend CT imaging to rule out cancer recurrence and she is in agreement

## 2022-12-06 NOTE — Telephone Encounter (Signed)
Pt called and states she tried to schedule her CT but the scheduler told her there was no date indicated.  CT order is in need of expected date. Are you able to edit this? Please let me know and I will message the PA team to work on PA. Pt states you wanted it within a week.

## 2022-12-08 ENCOUNTER — Telehealth: Payer: Self-pay | Admitting: Hematology and Oncology

## 2022-12-08 NOTE — Telephone Encounter (Signed)
Spoke with patient confirming upcoming appointment  

## 2022-12-11 ENCOUNTER — Ambulatory Visit (HOSPITAL_BASED_OUTPATIENT_CLINIC_OR_DEPARTMENT_OTHER): Payer: BC Managed Care – PPO

## 2022-12-14 ENCOUNTER — Ambulatory Visit (HOSPITAL_COMMUNITY)
Admission: RE | Admit: 2022-12-14 | Discharge: 2022-12-14 | Disposition: A | Discharge status: Home / Self Care | Payer: BC Managed Care – PPO | Source: Ambulatory Visit | Attending: Hematology and Oncology | Admitting: Hematology and Oncology

## 2022-12-14 DIAGNOSIS — Z8579 Personal history of other malignant neoplasms of lymphoid, hematopoietic and related tissues: Secondary | ICD-10-CM | POA: Diagnosis not present

## 2022-12-14 DIAGNOSIS — D539 Nutritional anemia, unspecified: Secondary | ICD-10-CM | POA: Diagnosis not present

## 2022-12-14 DIAGNOSIS — C8170 Other classical Hodgkin lymphoma, unspecified site: Secondary | ICD-10-CM | POA: Diagnosis not present

## 2022-12-14 DIAGNOSIS — N939 Abnormal uterine and vaginal bleeding, unspecified: Secondary | ICD-10-CM | POA: Diagnosis not present

## 2022-12-14 MED ORDER — SODIUM CHLORIDE (PF) 0.9 % IJ SOLN
INTRAMUSCULAR | Status: AC
  Filled 2022-12-14: qty 50

## 2022-12-14 MED ORDER — IOHEXOL 9 MG/ML PO SOLN
1000.0000 mL | ORAL | Status: AC
  Administered 2022-12-14: 1000 mL via ORAL

## 2022-12-14 MED ORDER — IOHEXOL 300 MG/ML  SOLN
100.0000 mL | Freq: Once | INTRAMUSCULAR | Status: AC | PRN
  Administered 2022-12-14: 100 mL via INTRAVENOUS

## 2022-12-14 MED ORDER — IOHEXOL 9 MG/ML PO SOLN
ORAL | Status: AC
  Filled 2022-12-14: qty 1000

## 2022-12-16 ENCOUNTER — Ambulatory Visit: Payer: BC Managed Care – PPO | Admitting: Hematology and Oncology

## 2022-12-26 ENCOUNTER — Inpatient Hospital Stay: Payer: BC Managed Care – PPO | Attending: Hematology and Oncology | Admitting: Hematology and Oncology

## 2022-12-26 ENCOUNTER — Encounter: Payer: Self-pay | Admitting: Hematology and Oncology

## 2022-12-26 VITALS — BP 120/84 | HR 58 | Temp 98.8°F | Resp 18 | Ht 69.0 in | Wt 169.6 lb

## 2022-12-26 DIAGNOSIS — N921 Excessive and frequent menstruation with irregular cycle: Secondary | ICD-10-CM | POA: Diagnosis not present

## 2022-12-26 DIAGNOSIS — Z8571 Personal history of Hodgkin lymphoma: Secondary | ICD-10-CM | POA: Insufficient documentation

## 2022-12-26 DIAGNOSIS — C8170 Other classical Hodgkin lymphoma, unspecified site: Secondary | ICD-10-CM | POA: Diagnosis not present

## 2022-12-26 NOTE — Assessment & Plan Note (Signed)
I reviewed CT imaging and blood work with the patient She has no signs of cancer recurrence I plan to see her back in 1 year for further follow-up

## 2022-12-26 NOTE — Progress Notes (Signed)
[facility] OFFICE PROGRESS NOTE  Patient Care Team: Laurann Montana, MD as PCP - General (Family Medicine)  ASSESSMENT & PLAN:  Classical Hodgkin lymphoma [facility]) I reviewed CT imaging and blood work with the patient She has no signs of cancer recurrence I plan to see her back in 1 year for further follow-up  Menorrhagia with irregular cycle Her blood work showed no evidence of coagulopathy I recommend the patient to reach out to her gynecologist for management  Orders Placed This Encounter  Procedures   CBC with Differential (Cancer Center Only)    Standing Status:   Future    Standing Expiration Date:   12/26/2023   CMP (Cancer Center only)    Standing Status:   Future    Standing Expiration Date:   12/26/2023   Sedimentation rate    Standing Status:   Future    Standing Expiration Date:   12/26/2023    All questions were answered. The patient knows to call the clinic with any problems, questions or concerns. The total time spent in the appointment was 20 minutes encounter with patients including review of chart and various tests results, discussions about plan of care and coordination of care plan   Artis Delay, MD 12/26/2022 12:04 PM  INTERVAL HISTORY: Please see below for problem oriented charting. she returns for review of test results We discussed test results today  REVIEW OF SYSTEMS:   Constitutional: Denies fevers, chills or abnormal weight loss Eyes: Denies blurriness of vision Ears, nose, mouth, throat, and face: Denies mucositis or sore throat Respiratory: Denies cough, dyspnea or wheezes Cardiovascular: Denies palpitation, chest discomfort or lower extremity swelling Gastrointestinal:  Denies nausea, heartburn or change in bowel habits Skin: Denies abnormal skin rashes Lymphatics: Denies new lymphadenopathy or easy bruising Neurological:Denies numbness, tingling or new weaknesses Behavioral/Psych: Mood is stable, no new changes  All other  systems were reviewed with the patient and are negative.  I have reviewed the past medical history, past surgical history, social history and family history with the patient and they are unchanged from previous note.  ALLERGIES:  has No Known Allergies.  MEDICATIONS:  Current Outpatient Medications  Medication Sig Dispense Refill   estradiol (ESTRACE) 2 MG tablet Take 2 mg by mouth daily.     levonorgestrel (MIRENA, 52 MG,) 20 MCG/24HR IUD 1 each by Intrauterine route once.      No current facility-administered medications for this visit.    SUMMARY OF ONCOLOGIC HISTORY: Oncology History  Classical Hodgkin lymphoma (HCC)  01/22/2018 Imaging   CT scan of neck: Adenopathy throughout the right lateral neck, right supraclavicular fossa, and upper mediastinum. There is no specific imaging feature, lymphoma or atypical infection are the primary considerations and excisional biopsy should be considered if there is no diagnosis by history or labs.     01/31/2018 Pathology Results   Lymph node for lymphoma, right supraclavicular / right cervical - ATYPICAL LYMPHOID PROLIFERATION. - SEE COMMENT. Microscopic Comment Sections show a few very small needle core biopsy fragments of lymph nodal tissue displaying a polymorphous cellular proliferation of small lymphocytes, plasma cells, eosinophils and histiocytes in addition to variable numbers of large mononuclear and multilobated lymphoid appearing cells with variably prominent nucleoli. Flow cytometric was performed ([id number]) and failed to show any monoclonal B-cell population or abnormal T-cell phenotype. Immunohistochemical stains were performed, including CD15, CD20, CD3, CD30, LCA, and PAX-5 with appropriate controls. The large atypical lymphoid appearing cells are positive for CD15, CD30, CD20 and PAX-5  and negative for CD3 and LCA. The small lymphoid component in the background is mostly composed of T cells. The morphologic and phenotypic features  are atypical and highly suspicious for involvement by a lymphoproliferative process, particularly classical Hodgkin lymphoma. Excisional biopsy is recommended.   01/31/2018 Procedure   Ultrasound-guided core biopsies of right cervical lymph nodes.   02/15/2018 Pathology Results   Lymph node for lymphoma, Right Cervical - CLASSICAL HODGKIN LYMPHOMA, NODULAR SCLEROSIS TYPE   03/05/2018 PET scan   1. Hypermetabolic RIGHT level 2 lymph nodes, supraclavicular lymph nodes, sub pectoralis lymph nodes and RIGHT axillary lymph nodes. 2. Hypermetabolic mediastinal lymph nodes. 3. No evidence of lymphoma beneath the diaphragms.  Normal spleen. 4. Normal bone marrow.   03/05/2018 Imaging   LV EF: 55% -   60%   03/06/2018 Cancer Staging   Staging form: Hodgkin and Non-Hodgkin Lymphoma, AJCC 8th Edition - Clinical stage from 03/06/2018: Stage II (Hodgkin lymphoma, B - Symptoms) - Signed by Artis Delay, MD on 03/06/2018   03/08/2018 - 08/17/2018 Chemotherapy   The patient had ABVD for chemotherapy treatment. After interim scan showed complete response, Bleomycin was discontinued    05/01/2018 PET scan   1. Near complete resolution of metabolic activity within RIGHT cervical lymph nodes, mediastinal nodes, RIGHT supraclavicular nodes, and axillary nodes ( Deauville 1 and 2). 2. No evidence lymphoma progression. 3. Normal spleen and marrow. 4. Extensive hypermetabolic brown fat within the neck and thorax makes evaluation challenging.   06/05/2018 Echocardiogram   LV EF: 60% -   65%   09/03/2018 Imaging   No acute abnormality identified in the abdomen or pelvis.   09/17/2018 PET scan   1. Faintly accentuated thymic activity with maximum SUV 4.3, which is at the Deauville 4 level. However, this may simply represent rebound or response to therapy, there is no masslike appearance in the anterior mediastinum. 2. Accentuated bilateral palatine tonsillar activity without CT correlate, probably  physiologic/incidental. 3. Otherwise no accentuated activity or pathologic enlarged adenopathy in the neck, chest, abdomen, or pelvis to suggest active disease. This would correspond to Deauville 1 response. 4. Chronic ethmoid and bilateral maxillary sinusitis.     04/11/2019 Imaging   Ct scan of chest Since the PET-CT of 09/17/2018, there has been progression of soft tissue in the anterior mediastinum. This may be related to thymic tissue although areas of the soft tissue are more nodular than typically seen for thymic remnant/rebound and close attention is recommended as recurrent lymphoma cannot be excluded. There was some soft tissue in this region on the prior PET-CT that demonstrated low level FDG accumulation. Repeat PET-CT may be warranted to further evaluate.   Stable tiny right lung nodules, most likely benign.   04/19/2019 PET scan   IMPRESSION: 1. Interval increase in triangular-shaped area of increased soft tissue within the anterior mediastinum with corresponding FDG uptake of 5.28. 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 Recurrent lymphoma is less favored. 2. Small soft tissue nodule which appears separate from the thymic gland within the superior mediastinum/prevascular region with SUV max of 4.13. Previously 1.61. Although technically borderline Deauville criteria 4, this is only minimally above liver activity. Suggest close interval follow-up.   08/14/2019 PET scan   1. 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 2. Small soft tissue nodule, potentially small lymph node or even thymic tissue also diminished in size with decreased  FDG uptake since the previous study. 3. 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 Continued follow-up may be helpful.   12/15/2022 Imaging   CT CHEST ABDOMEN PELVIS W CONTRAST  Result Date: 12/14/2022 CLINICAL DATA:  History of lymphoma, follow-up. * Tracking Code: BO * EXAM: CT CHEST, ABDOMEN, AND PELVIS WITH CONTRAST TECHNIQUE: Multidetector CT imaging of the chest, abdomen and pelvis was performed following the standard protocol during bolus administration of intravenous contrast. RADIATION DOSE REDUCTION: This exam was performed according to the departmental dose-optimization program which includes automated exposure control, adjustment of the mA and/or kV according to patient size and/or use of iterative reconstruction technique. CONTRAST:  OMNIPAQUE IOHEXOL 300 MG/ML  SOLN COMPARISON:  Multiple priors including PET-CT August 14, 2019 FINDINGS: CT CHEST FINDINGS Cardiovascular: Normal caliber thoracic aorta. No central pulmonary embolus on this nondedicated study. Normal size heart. No significant pericardial effusion/thickening. Mediastinum/Nodes: No suspicious thyroid nodule. No pathologically enlarged supraclavicular, mediastinal, hilar or axillary lymph nodes. The esophagus is grossly unremarkable. Lungs/Pleura: No suspicious pulmonary nodules or masses. No focal airspace consolidation. No pleural effusion. No pneumothorax. Musculoskeletal: No aggressive lytic or blastic lesion of bone. CT ABDOMEN PELVIS FINDINGS Hepatobiliary: No suspicious hepatic lesion. Gallbladder is unremarkable. No biliary ductal dilation. Pancreas: No pancreatic ductal dilation or evidence of acute inflammation. Spleen: No splenomegaly. Adrenals/Urinary Tract: Bilateral adrenal glands appear normal. No hydronephrosis. Kidneys demonstrate symmetric enhancement. Urinary bladder is unremarkable for degree of distension. Stomach/Bowel: Radiopaque enteric contrast material traverses the descending colon. Stomach is unremarkable for degree of distension. No pathologic dilation  of small or large bowel. No evidence of acute bowel inflammation. Vascular/Lymphatic: Normal caliber abdominal aorta. Smooth IVC contours. No pathologically enlarged abdominal or pelvic lymph nodes. Reproductive: Intrauterine device.  No suspicious adnexal mass. Other: No significant abdominopelvic free fluid. Musculoskeletal: No aggressive lytic or blastic lesion of bone. IMPRESSION: No adenopathy above or below the diaphragm and no splenomegaly. Electronically Signed   By: Maudry Mayhew M.D.   On: 12/14/2022 17:07        PHYSICAL EXAMINATION: ECOG PERFORMANCE STATUS: 0 - Asymptomatic  Vitals:   12/26/22 1146  BP: 120/84  Pulse: (!) 58  Resp: 18  Temp: 98.8 F (37.1 C)  SpO2: 100%   Filed Weights   12/26/22 1146  Weight: 169 lb 9.6 oz (76.9 kg)    GENERAL:alert, no distress and comfortable  NEURO: alert & oriented x 3 with fluent speech, no focal motor/sensory deficits  LABORATORY DATA:  I have reviewed the data as listed    Component Value Date/Time   NA 139 12/06/2022 0906   K 3.7 12/06/2022 0906   CL 105 12/06/2022 0906   CO2 26 12/06/2022 0906   GLUCOSE 82 12/06/2022 0906   BUN 9 12/06/2022 0906   CREATININE 0.86 12/06/2022 0906   CALCIUM 10.6 (H) 12/06/2022 0906   PROT 7.9 12/06/2022 0906   ALBUMIN 4.9 12/06/2022 0906   AST 17 12/06/2022 0906   ALT 14 12/06/2022 0906   ALKPHOS 41 12/06/2022 0906   BILITOT 1.5 (H) 12/06/2022 0906   GFRNONAA >60 12/06/2022 0906   GFRAA >60 04/11/2019 0939   GFRAA >60 03/26/2018 1147    No results found for: "SPEP", "UPEP"  Lab Results  Component Value Date   WBC 5.1 12/06/2022   NEUTROABS 3.2 12/06/2022   HGB 13.8 12/06/2022   HCT 41.6  12/06/2022   MCV 90.4 12/06/2022   PLT 261 12/06/2022      Chemistry      Component Value Date/Time   NA 139 12/06/2022 0906   K 3.7 12/06/2022 0906   CL 105 12/06/2022 0906   CO2 26 12/06/2022 0906   BUN 9 12/06/2022 0906   CREATININE 0.86 12/06/2022 0906      Component Value  Date/Time   CALCIUM 10.6 (H) 12/06/2022 0906   ALKPHOS 41 12/06/2022 0906   AST 17 12/06/2022 0906   ALT 14 12/06/2022 0906   BILITOT 1.5 (H) 12/06/2022 0906       RADIOGRAPHIC STUDIES: I have personally reviewed the radiological images as listed and agreed with the findings in the report. CT CHEST ABDOMEN PELVIS W CONTRAST  Result Date: 12/14/2022 CLINICAL DATA:  History of lymphoma, follow-up. * Tracking Code: BO * EXAM: CT CHEST, ABDOMEN, AND PELVIS WITH CONTRAST TECHNIQUE: Multidetector CT imaging of the chest, abdomen and pelvis was performed following the standard protocol during bolus administration of intravenous contrast. RADIATION DOSE REDUCTION: This exam was performed according to the departmental dose-optimization program which includes automated exposure control, adjustment of the mA and/or kV according to patient size and/or use of iterative reconstruction technique. CONTRAST:  OMNIPAQUE IOHEXOL 300 MG/ML  SOLN COMPARISON:  Multiple priors including PET-CT August 14, 2019 FINDINGS: CT CHEST FINDINGS Cardiovascular: Normal caliber thoracic aorta. No central pulmonary embolus on this nondedicated study. Normal size heart. No significant pericardial effusion/thickening. Mediastinum/Nodes: No suspicious thyroid nodule. No pathologically enlarged supraclavicular, mediastinal, hilar or axillary lymph nodes. The esophagus is grossly unremarkable. Lungs/Pleura: No suspicious pulmonary nodules or masses. No focal airspace consolidation. No pleural effusion. No pneumothorax. Musculoskeletal: No aggressive lytic or blastic lesion of bone. CT ABDOMEN PELVIS FINDINGS Hepatobiliary: No suspicious hepatic lesion. Gallbladder is unremarkable. No biliary ductal dilation. Pancreas: No pancreatic ductal dilation or evidence of acute inflammation. Spleen: No splenomegaly. Adrenals/Urinary Tract: Bilateral adrenal glands appear normal. No hydronephrosis. Kidneys demonstrate symmetric enhancement.  Urinary bladder is unremarkable for degree of distension. Stomach/Bowel: Radiopaque enteric contrast material traverses the descending colon. Stomach is unremarkable for degree of distension. No pathologic dilation of small or large bowel. No evidence of acute bowel inflammation. Vascular/Lymphatic: Normal caliber abdominal aorta. Smooth IVC contours. No pathologically enlarged abdominal or pelvic lymph nodes. Reproductive: Intrauterine device.  No suspicious adnexal mass. Other: No significant abdominopelvic free fluid. Musculoskeletal: No aggressive lytic or blastic lesion of bone. IMPRESSION: No adenopathy above or below the diaphragm and no splenomegaly. Electronically Signed   By: Maudry Mayhew M.D.   On: 12/14/2022 17:07

## 2022-12-26 NOTE — Assessment & Plan Note (Signed)
Her blood work showed no evidence of coagulopathy I recommend the patient to reach out to her gynecologist for management

## 2023-06-08 DIAGNOSIS — Z113 Encounter for screening for infections with a predominantly sexual mode of transmission: Secondary | ICD-10-CM | POA: Diagnosis not present

## 2023-06-08 DIAGNOSIS — Z30430 Encounter for insertion of intrauterine contraceptive device: Secondary | ICD-10-CM | POA: Diagnosis not present

## 2023-06-08 DIAGNOSIS — Z30433 Encounter for removal and reinsertion of intrauterine contraceptive device: Secondary | ICD-10-CM | POA: Diagnosis not present

## 2023-06-08 DIAGNOSIS — Z3202 Encounter for pregnancy test, result negative: Secondary | ICD-10-CM | POA: Diagnosis not present

## 2023-10-03 ENCOUNTER — Encounter (HOSPITAL_BASED_OUTPATIENT_CLINIC_OR_DEPARTMENT_OTHER): Payer: Self-pay | Admitting: Emergency Medicine

## 2023-10-03 ENCOUNTER — Emergency Department (HOSPITAL_BASED_OUTPATIENT_CLINIC_OR_DEPARTMENT_OTHER)
Admission: EM | Admit: 2023-10-03 | Discharge: 2023-10-04 | Discharge status: Against Medical Advice | Payer: BC Managed Care – PPO | Attending: Emergency Medicine | Admitting: Emergency Medicine

## 2023-10-03 DIAGNOSIS — R6883 Chills (without fever): Secondary | ICD-10-CM | POA: Diagnosis not present

## 2023-10-03 DIAGNOSIS — Z5321 Procedure and treatment not carried out due to patient leaving prior to being seen by health care provider: Secondary | ICD-10-CM | POA: Diagnosis not present

## 2023-10-03 DIAGNOSIS — R109 Unspecified abdominal pain: Secondary | ICD-10-CM | POA: Insufficient documentation

## 2023-10-03 LAB — URINALYSIS, ROUTINE W REFLEX MICROSCOPIC
Bacteria, UA: NONE SEEN
Bilirubin Urine: NEGATIVE
Glucose, UA: NEGATIVE mg/dL
Ketones, ur: NEGATIVE mg/dL
Nitrite: NEGATIVE
Specific Gravity, Urine: 1.033 — ABNORMAL HIGH (ref 1.005–1.030)
pH: 6 (ref 5.0–8.0)

## 2023-10-03 LAB — COMPREHENSIVE METABOLIC PANEL
ALT: 14 U/L (ref 0–44)
AST: 19 U/L (ref 15–41)
Albumin: 4.6 g/dL (ref 3.5–5.0)
Alkaline Phosphatase: 52 U/L (ref 38–126)
Anion gap: 9 (ref 5–15)
BUN: 14 mg/dL (ref 6–20)
CO2: 24 mmol/L (ref 22–32)
Calcium: 9.3 mg/dL (ref 8.9–10.3)
Chloride: 102 mmol/L (ref 98–111)
Creatinine, Ser: 0.76 mg/dL (ref 0.44–1.00)
GFR, Estimated: >60 mL/min (ref >60)
Glucose, Bld: 99 mg/dL (ref 70–99)
Potassium: 3.7 mmol/L (ref 3.5–5.1)
Sodium: 135 mmol/L (ref 135–145)
Total Bilirubin: 2.5 mg/dL — ABNORMAL HIGH (ref 0.0–1.2)
Total Protein: 7.7 g/dL (ref 6.5–8.1)

## 2023-10-03 LAB — CBC
HCT: 43 % (ref 36.0–46.0)
Hemoglobin: 14.5 g/dL (ref 12.0–15.0)
MCH: 31.9 pg (ref 26.0–34.0)
MCHC: 33.7 g/dL (ref 30.0–36.0)
MCV: 94.5 fL (ref 80.0–100.0)
Platelets: 263 10*3/uL (ref 150–400)
RBC: 4.55 MIL/uL (ref 3.87–5.11)
RDW: 12.2 % (ref 11.5–15.5)
WBC: 6.8 10*3/uL (ref 4.0–10.5)
nRBC: 0 % (ref 0.0–0.2)

## 2023-10-03 LAB — LIPASE, BLOOD: Lipase: 22 U/L (ref 11–51)

## 2023-10-03 LAB — PREGNANCY, URINE: Preg Test, Ur: NEGATIVE

## 2023-10-03 NOTE — ED Triage Notes (Signed)
 Seen at [facility] for urine symptoms, chills abdo pain since sunday  Sent for eval  Per patient neg covid, flu,  Some blood in urine at [facility]

## 2023-10-04 NOTE — ED Notes (Signed)
 No answer when called to room

## 2023-10-17 ENCOUNTER — Emergency Department (HOSPITAL_BASED_OUTPATIENT_CLINIC_OR_DEPARTMENT_OTHER)

## 2023-10-17 ENCOUNTER — Emergency Department (HOSPITAL_BASED_OUTPATIENT_CLINIC_OR_DEPARTMENT_OTHER)
Admission: EM | Admit: 2023-10-17 | Discharge: 2023-10-17 | Disposition: A | Discharge status: Home / Self Care | Attending: Emergency Medicine | Admitting: Emergency Medicine

## 2023-10-17 ENCOUNTER — Other Ambulatory Visit: Payer: Self-pay

## 2023-10-17 ENCOUNTER — Encounter (HOSPITAL_BASED_OUTPATIENT_CLINIC_OR_DEPARTMENT_OTHER): Payer: Self-pay

## 2023-10-17 DIAGNOSIS — R509 Fever, unspecified: Secondary | ICD-10-CM | POA: Insufficient documentation

## 2023-10-17 DIAGNOSIS — R1110 Vomiting, unspecified: Secondary | ICD-10-CM | POA: Insufficient documentation

## 2023-10-17 DIAGNOSIS — R52 Pain, unspecified: Secondary | ICD-10-CM | POA: Insufficient documentation

## 2023-10-17 DIAGNOSIS — R419 Unspecified symptoms and signs involving cognitive functions and awareness: Secondary | ICD-10-CM | POA: Insufficient documentation

## 2023-10-17 DIAGNOSIS — R110 Nausea: Secondary | ICD-10-CM

## 2023-10-17 LAB — URINALYSIS, W/ REFLEX TO CULTURE (INFECTION SUSPECTED)
Bacteria, UA: NONE SEEN
Bilirubin Urine: NEGATIVE
Glucose, UA: NEGATIVE mg/dL
Ketones, ur: NEGATIVE mg/dL
Leukocytes,Ua: NEGATIVE
Nitrite: NEGATIVE
Protein, ur: NEGATIVE mg/dL
Specific Gravity, Urine: 1.005 (ref 1.005–1.030)
pH: 6.5 (ref 5.0–8.0)

## 2023-10-17 LAB — COMPREHENSIVE METABOLIC PANEL
ALT: 27 U/L (ref 0–44)
AST: 29 U/L (ref 15–41)
Albumin: 4.4 g/dL (ref 3.5–5.0)
Alkaline Phosphatase: 45 U/L (ref 38–126)
Anion gap: 7 (ref 5–15)
BUN: 11 mg/dL (ref 6–20)
CO2: 28 mmol/L (ref 22–32)
Calcium: 9.3 mg/dL (ref 8.9–10.3)
Chloride: 102 mmol/L (ref 98–111)
Creatinine, Ser: 1 mg/dL (ref 0.44–1.00)
GFR, Estimated: >60 mL/min (ref >60)
Glucose, Bld: 93 mg/dL (ref 70–99)
Potassium: 3.3 mmol/L — ABNORMAL LOW (ref 3.5–5.1)
Sodium: 137 mmol/L (ref 135–145)
Total Bilirubin: 1.8 mg/dL — ABNORMAL HIGH (ref 0.0–1.2)
Total Protein: 7.4 g/dL (ref 6.5–8.1)

## 2023-10-17 LAB — CBC WITH DIFFERENTIAL/PLATELET
Abs Immature Granulocytes: 0.02 10*3/uL (ref 0.00–0.07)
Basophils Absolute: 0 10*3/uL (ref 0.0–0.1)
Basophils Relative: 1 %
Eosinophils Absolute: 0.2 10*3/uL (ref 0.0–0.5)
Eosinophils Relative: 3 %
HCT: 41.8 % (ref 36.0–46.0)
Hemoglobin: 14.2 g/dL (ref 12.0–15.0)
Immature Granulocytes: 0 %
Lymphocytes Relative: 12 %
Lymphs Abs: 0.7 10*3/uL (ref 0.7–4.0)
MCH: 31.9 pg (ref 26.0–34.0)
MCHC: 34 g/dL (ref 30.0–36.0)
MCV: 93.9 fL (ref 80.0–100.0)
Monocytes Absolute: 0.6 10*3/uL (ref 0.1–1.0)
Monocytes Relative: 10 %
Neutro Abs: 4.4 10*3/uL (ref 1.7–7.7)
Neutrophils Relative %: 74 %
Platelets: 229 10*3/uL (ref 150–400)
RBC: 4.45 MIL/uL (ref 3.87–5.11)
RDW: 12.1 % (ref 11.5–15.5)
WBC: 6 10*3/uL (ref 4.0–10.5)
nRBC: 0 % (ref 0.0–0.2)

## 2023-10-17 LAB — TROPONIN I (HIGH SENSITIVITY): Troponin I (High Sensitivity): <2 ng/L (ref <18)

## 2023-10-17 LAB — PREGNANCY, URINE: Preg Test, Ur: NEGATIVE

## 2023-10-17 LAB — RESP PANEL BY RT-PCR (RSV, FLU A&B, COVID)  RVPGX2
Influenza A by PCR: NEGATIVE
Influenza B by PCR: NEGATIVE
Resp Syncytial Virus by PCR: NEGATIVE
SARS Coronavirus 2 by RT PCR: NEGATIVE

## 2023-10-17 LAB — LIPASE, BLOOD: Lipase: 11 U/L (ref 11–51)

## 2023-10-17 MED ORDER — ONDANSETRON 4 MG PO TBDP: 4.0000 mg | ORAL_TABLET | Freq: Three times a day (TID) | ORAL | 0 refills | Status: AC | PRN

## 2023-10-17 MED ORDER — ONDANSETRON 4 MG PO TBDP
4.0000 mg | ORAL_TABLET | Freq: Once | ORAL | Status: AC
  Administered 2023-10-17: 4 mg via ORAL
  Filled 2023-10-17: qty 1

## 2023-10-17 MED ORDER — POTASSIUM CHLORIDE CRYS ER 20 MEQ PO TBCR
40.0000 meq | EXTENDED_RELEASE_TABLET | Freq: Once | ORAL | Status: AC
  Administered 2023-10-17: 40 meq via ORAL
  Filled 2023-10-17: qty 2

## 2023-10-17 NOTE — Discharge Instructions (Signed)
 You have been seen today for your complaint of nausea, abdominal pain, chest pain, body aches. Your lab work was reassuring. Your imaging was reassuring. Your discharge medications include zofran. This is a nausea medicine. Take it as needed. Follow up with: Your primary care provider Please seek immediate medical care if you develop any of the following symptoms: You have pain in your chest, neck, arm, or jaw. You feel extremely weak or you faint. You have vomit that is bright red or looks like coffee grounds. You have bloody or black stools (feces) or stools that look like tar. You have a severe headache, a stiff neck, or both. You have severe pain, cramping, or bloating in your abdomen. You have difficulty breathing or are breathing very quickly. Your heart is beating very quickly. Your skin feels cold and clammy. You feel confused. You have signs of dehydration, such as: Dark urine, very little urine, or no urine. Cracked lips. Dry mouth. Sunken eyes. Sleepiness. Weakness. At this time there does not appear to be the presence of an emergent medical condition, however there is always the potential for conditions to change. Please read and follow the below instructions.  Do not take your medicine if  develop an itchy rash, swelling in your mouth or lips, or difficulty breathing; call 911 and seek immediate emergency medical attention if this occurs.  You may review your lab tests and imaging results in their entirety on your MyChart account.  Please discuss all results of fully with your primary care provider and other specialist at your follow-up visit.  Note: Portions of this text may have been transcribed using voice recognition software. Every effort was made to ensure accuracy; however, inadvertent computerized transcription errors may still be present.

## 2023-10-17 NOTE — ED Notes (Signed)
 ED Provider at bedside.

## 2023-10-17 NOTE — ED Triage Notes (Signed)
 In for eval of intermittent sharp abd pains and chest pains. Denies pain at this time. Body aches, chills, nausea and vomiting. Was seen by UC and ED on 10/03/2023 for same and prescribed antibiotics. Reports fever 101 last pm. Last vomited last pm.

## 2023-10-17 NOTE — ED Provider Notes (Signed)
 [place] EMERGENCY DEPARTMENT AT [facility] Provider Note   CSN: [id number] Arrival date & time: 10/17/23  0920     History  Chief Complaint  Patient presents with   Nausea   Generalized Body Aches    Adrienne [name] is a 33 y.o. female. With a history of anxiety, depression, Hodgkin's lymphoma in remission presenting to the ED for evaluation of multiple complaints.  Over the past 2 weeks she has had intermittent episodes of abdominal pain, nausea, vomiting, body aches, chills, joint pains.  Symptoms last up to 12 hours at a time and have occurred twice.  She felt like she was going to die last night.  She had a fever with a Tmax of 101F last night.  Has not had any fevers today.  No antipyretics taken today.  She states her symptoms have significantly improved, however she still does not feel right.  She was started on Bactrim for complicated UTI at symptom onset by urgent care.  She was given 2 weeks of this medication, she is still taking the Bactrim.  No cough, congestion or rhinorrhea.  She has intermittent chest pains during these episodes, no chest pain currently.  She is wondering if symptoms may be secondary to her gallbladder.  Symptoms are not postprandial in nature.  HPI     Home Medications Prior to Admission medications   Medication Sig Start Date End Date Taking? Authorizing Provider  levonorgestrel (MIRENA, 52 MG,) 20 MCG/24HR IUD 1 each by Intrauterine route once.    Yes [provider]  ondansetron (ZOFRAN-ODT) 4 MG disintegrating tablet Take 1 tablet (4 mg total) by mouth every 8 (eight) hours as needed for nausea or vomiting. 10/17/23  Yes Erionna Strum, Edsel Petrin, PA-C  sulfamethoxazole-trimethoprim (BACTRIM DS) 800-160 MG tablet Take 1 tablet by mouth 2 (two) times daily. 10/03/23 10/17/23 Yes [provider]  estradiol (ESTRACE) 2 MG tablet Take 2 mg by mouth daily. 11/21/22   [provider]      Allergies    Patient has no known  allergies.    Review of Systems   Review of Systems  Constitutional:  Positive for chills and fever.  Cardiovascular:  Positive for chest pain.  Gastrointestinal:  Positive for abdominal pain, nausea and vomiting.  All other systems reviewed and are negative.   Physical Exam Updated Vital Signs BP 115/84 (BP Location: Right Arm)   Pulse 75   Temp 98 F (36.7 C) (Oral)   Resp 16   Ht 5\' 9"  (1.753 m)   Wt 72.6 kg   LMP 09/18/2023 (Approximate)   SpO2 99%   BMI 23.63 kg/m  Physical Exam Vitals and nursing note reviewed.  Constitutional:      General: She is not in acute distress.    Appearance: Normal appearance. She is normal weight. She is not ill-appearing.     Comments: Resting comfortably in bed  HENT:     Head: Normocephalic and atraumatic.  Cardiovascular:     Rate and Rhythm: Normal rate and regular rhythm.  Pulmonary:     Effort: Pulmonary effort is normal. No respiratory distress.     Breath sounds: No wheezing, rhonchi or rales.  Abdominal:     General: Abdomen is flat.  Musculoskeletal:        General: Normal range of motion.     Cervical back: Neck supple.  Skin:    General: Skin is warm and dry.  Neurological:     Mental Status: She is  alert and oriented to person, place, and time.  Psychiatric:        Mood and Affect: Mood normal.        Behavior: Behavior normal.     Comments: Appears anxious     ED Results / Procedures / Treatments   Labs (all labs ordered are listed, but only abnormal results are displayed) Labs Reviewed  COMPREHENSIVE METABOLIC PANEL - Abnormal; Notable for the following components:      Result Value   Potassium 3.3 (*)    Total Bilirubin 1.8 (*)    All other components within normal limits  URINALYSIS, W/ REFLEX TO CULTURE (INFECTION SUSPECTED) - Abnormal; Notable for the following components:   Hgb urine dipstick TRACE (*)    All other components within normal limits  RESP PANEL BY RT-PCR (RSV, FLU A&B, COVID)  [id number]   CBC WITH DIFFERENTIAL/PLATELET  LIPASE, BLOOD  PREGNANCY, URINE  TROPONIN I (HIGH SENSITIVITY)    EKG EKG Interpretation Date/Time:  Tuesday October 17 2023 10:14:02 EDT Ventricular Rate:  71 PR Interval:  137 QRS Duration:  84 QT Interval:  385 QTC Calculation: 419 R Axis:   72  Text Interpretation: Sinus rhythm no stemi no prior Confirmed by Tanda Rockers ([id number]) on 10/17/2023 11:55:18 AM  Radiology DG Chest Port 1 View Result Date: 10/17/2023 CLINICAL DATA:  Chest pain. EXAM: PORTABLE CHEST 1 VIEW COMPARISON:  Chest radiograph dated 01/22/2021. FINDINGS: The heart size and mediastinal contours are within normal limits. Both lungs are clear. The visualized skeletal structures are unremarkable. IMPRESSION: No active disease. Electronically Signed   By: Elgie Collard M.D.   On: 10/17/2023 11:51    Procedures Procedures    Medications Ordered in ED Medications  potassium chloride SA (KLOR-CON M) CR tablet 40 mEq (has no administration in time range)  ondansetron (ZOFRAN-ODT) disintegrating tablet 4 mg (4 mg Oral Given 10/17/23 4098)    ED Course/ Medical Decision Making/ A&P                                 Medical Decision Making Amount and/or Complexity of Data Reviewed Labs: ordered. Radiology: ordered.  Risk Prescription drug management.  This patient presents to the ED for concern of multiple complaints, abdominal pain, this involves an extensive number of treatment options, and is a complaint that carries with it a high risk of complications and morbidity.  The differential diagnosis for generalized abdominal pain includes, but is not limited to AAA, gastroenteritis, appendicitis, Bowel obstruction, Bowel perforation. Gastroparesis, DKA, Hernia, Inflammatory bowel disease, mesenteric ischemia, pancreatitis, peritonitis SBP, volvulus.   My initial workup includes labs, imaging, EKG  Additional history obtained from: Nursing notes from this visit.  I ordered,  reviewed and interpreted labs which include: CBC, CMP, lipase, urinalysis, urine pregnancy, troponin, respiratory panel.  No leukocytosis or anemia.  No kidney dysfunction.  Total bilirubin mildly elevated to 1.8 decreased from previous.  Urine without evidence of infection.  Pregnancy negative.  Respiratory panel negative.  Troponin and lipase negative.  I ordered imaging studies including chest x-ray I independently visualized and interpreted imaging which showed negative I agree with the radiologist interpretation  Cardiac Monitoring:  The patient was maintained on a cardiac monitor.  I personally viewed and interpreted the cardiac monitored which showed an underlying rhythm of: NSR  Afebrile, hemodynamically stable.  33 year old female presenting to the ED for evaluation of multiple complaints including nausea, vomiting, abdominal  pain, chest pain, body aches, joint pain.  Symptoms are intermittent and have occurred a couple times over the past 2 weeks.  Most recently, symptoms lasted approximately 12 hours.  Lab workup was very reassuring.  Chest x-ray negative.  She appears very well on physical exam.  Very low suspicion for acute emergent etiology of her symptoms.  She was encouraged to follow-up with her primary care provider for further evaluation.  She was given return precautions.  Stable at discharge.  At this time there does not appear to be any evidence of an acute emergency medical condition and the patient appears stable for discharge with appropriate outpatient follow up. Diagnosis was discussed with patient who verbalizes understanding of care plan and is agreeable to discharge. I have discussed return precautions with patient who verbalizes understanding. Patient encouraged to follow-up with their PCP within 1 week. All questions answered.  Note: Portions of this report may have been transcribed using voice recognition software. Every effort was made to ensure accuracy; however,  inadvertent computerized transcription errors may still be present.         Final Clinical Impression(s) / ED Diagnoses Final diagnoses:  Nausea  Body aches    Rx / DC Orders ED Discharge Orders          Ordered    ondansetron (ZOFRAN-ODT) 4 MG disintegrating tablet  Every 8 hours PRN        10/17/23 1203              Michelle Piper, PA-C 10/17/23 1203    Sloan Leiter, DO 10/18/23 [phone]

## 2023-10-17 NOTE — ED Notes (Signed)
Discharge instructions, follow up care, and prescription reviewed and explained, pt verbalized understanding. Pt caox4, ambulatory, NAD on d/c.  

## 2023-10-26 ENCOUNTER — Emergency Department (HOSPITAL_COMMUNITY): Admission: EM | Admit: 2023-10-26 | Discharge: 2023-10-26 | Disposition: A | Discharge status: Home / Self Care

## 2023-10-26 ENCOUNTER — Emergency Department (HOSPITAL_COMMUNITY)

## 2023-10-26 ENCOUNTER — Other Ambulatory Visit: Payer: Self-pay

## 2023-10-26 ENCOUNTER — Encounter (HOSPITAL_COMMUNITY): Payer: Self-pay

## 2023-10-26 DIAGNOSIS — K8050 Calculus of bile duct without cholangitis or cholecystitis without obstruction: Secondary | ICD-10-CM | POA: Diagnosis not present

## 2023-10-26 DIAGNOSIS — R1011 Right upper quadrant pain: Secondary | ICD-10-CM | POA: Diagnosis present

## 2023-10-26 LAB — URINALYSIS, ROUTINE W REFLEX MICROSCOPIC
Bilirubin Urine: NEGATIVE
Glucose, UA: NEGATIVE mg/dL
Ketones, ur: 20 mg/dL — AB
Leukocytes,Ua: NEGATIVE
Nitrite: NEGATIVE
Protein, ur: NEGATIVE mg/dL
Specific Gravity, Urine: 1.019 (ref 1.005–1.030)
pH: 5 (ref 5.0–8.0)

## 2023-10-26 LAB — CBC
HCT: 42.6 % (ref 36.0–46.0)
Hemoglobin: 13.7 g/dL (ref 12.0–15.0)
MCH: 31.1 pg (ref 26.0–34.0)
MCHC: 32.2 g/dL (ref 30.0–36.0)
MCV: 96.8 fL (ref 80.0–100.0)
Platelets: 330 10*3/uL (ref 150–400)
RBC: 4.4 MIL/uL (ref 3.87–5.11)
RDW: 12.1 % (ref 11.5–15.5)
WBC: 9.3 10*3/uL (ref 4.0–10.5)
nRBC: 0 % (ref 0.0–0.2)

## 2023-10-26 LAB — COMPREHENSIVE METABOLIC PANEL
ALT: 18 U/L (ref 0–44)
AST: 25 U/L (ref 15–41)
Albumin: 3.9 g/dL (ref 3.5–5.0)
Alkaline Phosphatase: 42 U/L (ref 38–126)
Anion gap: 8 (ref 5–15)
BUN: 17 mg/dL (ref 6–20)
CO2: 22 mmol/L (ref 22–32)
Calcium: 9.1 mg/dL (ref 8.9–10.3)
Chloride: 104 mmol/L (ref 98–111)
Creatinine, Ser: 0.64 mg/dL (ref 0.44–1.00)
GFR, Estimated: >60 mL/min (ref >60)
Glucose, Bld: 75 mg/dL (ref 70–99)
Potassium: 4.3 mmol/L (ref 3.5–5.1)
Sodium: 134 mmol/L — ABNORMAL LOW (ref 135–145)
Total Bilirubin: 1.8 mg/dL — ABNORMAL HIGH (ref 0.0–1.2)
Total Protein: 7.3 g/dL (ref 6.5–8.1)

## 2023-10-26 LAB — LIPASE, BLOOD: Lipase: 25 U/L (ref 11–51)

## 2023-10-26 LAB — HCG, SERUM, QUALITATIVE: Preg, Serum: NEGATIVE

## 2023-10-26 MED ORDER — MORPHINE SULFATE (PF) 2 MG/ML IV SOLN
2.0000 mg | Freq: Once | INTRAVENOUS | Status: AC
  Administered 2023-10-26: 2 mg via INTRAVENOUS
  Filled 2023-10-26: qty 1

## 2023-10-26 MED ORDER — IOHEXOL 300 MG/ML  SOLN
100.0000 mL | Freq: Once | INTRAMUSCULAR | Status: AC | PRN
Start: 2023-10-26 — End: 2023-10-26
  Administered 2023-10-26: 100 mL via INTRAVENOUS

## 2023-10-26 NOTE — Discharge Instructions (Addendum)
 Please follow-up with the surgeon.  Please follow a low-fat diet.  Return if develop fevers, chills, worsening abdominal pain, inability to eat or drink due to nausea vomiting or you develop any new or worsening symptoms that are concerning to you.

## 2023-10-26 NOTE — ED Triage Notes (Signed)
 Patient is here for evaluation of right upper quadrant pain that is most severe after eating anything except crackers. Reports this has been going on for 3 weeks with nausea and vomiting.

## 2023-10-26 NOTE — ED Notes (Signed)
 Pt made aware of UA sample. Unable to provide sample att. Will re-attempt later

## 2023-10-26 NOTE — ED Provider Notes (Signed)
 [place] EMERGENCY DEPARTMENT AT [facility] Provider Note   CSN: [id number] Arrival date & time: 10/26/23  7564     History  Chief Complaint  Patient presents with   Abdominal Pain    Adrienne [name] is a 33 y.o. female.  This is a 33 year old female with no prior surgical history presenting emergency department for right upper quadrant abdominal pain that has been intermittent over the past 3 weeks; but notes intermittent issues over the past year or so.  Reports symptoms worsen after she eats, with increased RUQ pain with nausea and vomiting.  Symptoms seemingly worse last night.  Took Zofran with some improvement of her nausea, but still complains of right upper quadrant abdominal pain.  Denies fevers chills, CP. No prior abd surgeries. H/o of hodgkin lymphoma; in remission.    Abdominal Pain      Home Medications Prior to Admission medications   Medication Sig Start Date End Date Taking? Authorizing Provider  estradiol (ESTRACE) 2 MG tablet Take 2 mg by mouth daily. 11/21/22   [provider]  levonorgestrel (MIRENA, 52 MG,) 20 MCG/24HR IUD 1 each by Intrauterine route once.     [provider]  ondansetron (ZOFRAN-ODT) 4 MG disintegrating tablet Take 1 tablet (4 mg total) by mouth every 8 (eight) hours as needed for nausea or vomiting. 10/17/23   Schutt, Edsel Petrin, PA-C      Allergies    Patient has no known allergies.    Review of Systems   Review of Systems  Gastrointestinal:  Positive for abdominal pain.    Physical Exam Updated Vital Signs BP 114/69   Pulse 80   Temp 98 F (36.7 C)   Resp 14   Ht 5\' 9"  (1.753 m)   Wt 72.6 kg   LMP 09/18/2023 (Approximate)   SpO2 100%   BMI 23.64 kg/m  Physical Exam Vitals and nursing note reviewed.  Constitutional:      General: She is not in acute distress. HENT:     Head: Normocephalic.  Cardiovascular:     Rate and Rhythm: Normal rate and regular rhythm.  Pulmonary:     Effort:  Pulmonary effort is normal.     Breath sounds: Normal breath sounds.  Abdominal:     General: Abdomen is flat.     Palpations: Abdomen is soft.     Tenderness: There is abdominal tenderness in the right upper quadrant and epigastric area.  Skin:    Capillary Refill: Capillary refill takes less than 2 seconds.  Neurological:     Mental Status: She is alert and oriented to person, place, and time.  Psychiatric:        Mood and Affect: Mood normal.        Behavior: Behavior normal.     ED Results / Procedures / Treatments   Labs (all labs ordered are listed, but only abnormal results are displayed) Labs Reviewed  COMPREHENSIVE METABOLIC PANEL - Abnormal; Notable for the following components:      Result Value   Sodium 134 (*)    Total Bilirubin 1.8 (*)    All other components within normal limits  URINALYSIS, ROUTINE W REFLEX MICROSCOPIC - Abnormal; Notable for the following components:   Hgb urine dipstick SMALL (*)    Ketones, ur 20 (*)    Bacteria, UA RARE (*)    All other components within normal limits  CBC  LIPASE, BLOOD  HCG, SERUM, QUALITATIVE    EKG EKG Interpretation  Date/Time:  Thursday October 26 2023 07:53:32 EDT Ventricular Rate:  50 PR Interval:  133 QRS Duration:  83 QT Interval:  429 QTC Calculation: 392 R Axis:   72  Text Interpretation: Sinus rhythm Confirmed by Estanislado Pandy [phone]) on 10/26/2023 10:47:44 AM  Radiology CT ABDOMEN PELVIS W CONTRAST Result Date: 10/26/2023 CLINICAL DATA:  Severe right upper quadrant pain after eating for the past 3 weeks, with associated nausea and vomiting. EXAM: CT ABDOMEN AND PELVIS WITH CONTRAST TECHNIQUE: Multidetector CT imaging of the abdomen and pelvis was performed using the standard protocol following bolus administration of intravenous contrast. RADIATION DOSE REDUCTION: This exam was performed according to the departmental dose-optimization program which includes automated exposure control, adjustment of the mA  and/or kV according to patient size and/or use of iterative reconstruction technique. CONTRAST:  OMNIPAQUE IOHEXOL 300 MG/ML  SOLN COMPARISON:  Right upper quadrant ultrasound from same day. CT chest, abdomen, and pelvis dated Dec 14, 2022. FINDINGS: Lower chest: No acute abnormality. Hepatobiliary: No focal liver abnormality is seen. No gallstones, gallbladder wall thickening, or biliary dilatation. Pancreas: Unremarkable. No pancreatic ductal dilatation or surrounding inflammatory changes. Spleen: Normal in size without focal abnormality. Adrenals/Urinary Tract: Adrenal glands are unremarkable. Kidneys are normal, without renal calculi, focal lesion, or hydronephrosis. Bladder is unremarkable. Stomach/Bowel: Stomach is within normal limits. Appendix appears normal. No evidence of bowel wall thickening, distention, or inflammatory changes. Vascular/Lymphatic: No significant vascular findings are present. No enlarged abdominal or pelvic lymph nodes. Reproductive: Uterus and bilateral adnexa are unremarkable. IUD again noted, appropriately positioned within the endometrial canal. Other: No abdominal wall hernia or abnormality. No abdominopelvic ascites. No pneumoperitoneum. Musculoskeletal: No acute or significant osseous findings. IMPRESSION: 1. No acute intra-abdominal process. Electronically Signed   By: Obie Dredge M.D.   On: 10/26/2023 10:55   US Abdomen Limited RUQ (LIVER/GB) Result Date: 10/26/2023 CLINICAL DATA:  33 year old female with right upper quadrant pain. EXAM: ULTRASOUND ABDOMEN LIMITED RIGHT UPPER QUADRANT COMPARISON:  CT Chest, Abdomen, and Pelvis today are reported separately. 12/14/2022. FINDINGS: Gallbladder: Normal gallbladder wall thickness. Mild sludge suspected (images 5, 12). No gallstones identified. No pericholecystic fluid. No sonographic Murphy sign elicited. Common bile duct: Diameter: 1-2 mm, normal. Liver: No focal lesion identified. Within normal limits in parenchymal  echogenicity. Portal vein is patent on color Doppler imaging with normal direction of blood flow towards the liver. Other: Negative visible right kidney.  No free fluid. IMPRESSION: Gallbladder sludge, but otherwise normal right upper quadrant Ultrasound. Electronically Signed   By: Odessa Fleming M.D.   On: 10/26/2023 08:01    Procedures Procedures    Medications Ordered in ED Medications  morphine (PF) 2 MG/ML injection 2 mg (2 mg Intravenous Given 10/26/23 0831)  iohexol (OMNIPAQUE) 300 MG/ML solution 100 mL (100 mLs Intravenous Contrast Given 10/26/23 6045)    ED Course/ Medical Decision Making/ A&P Clinical Course as of 10/26/23 1341  Thu Oct 26, 2023  0806 US Abdomen Limited RUQ (LIVER/GB) IMPRESSION: Gallbladder sludge, but otherwise normal right upper quadrant Ultrasound.   [TY]  1106 CT ABDOMEN PELVIS W CONTRAST IMPRESSION: 1. No acute intra-abdominal process.   Electronically Signed   [TY]  1118 Patient feeling improved.  Tolerating p.o.  Workup overall reassuring.  No metabolic derangements.  Normal kidney function.  No transaminitis to suggest hepatobiliary disease.  Mild elevation in T. bili, appears that she has somewhat of a chronically elevated T. bili.  She has no leukocytosis to suggest infectious process.  Lipase normal.  Pancreatitis unlikely.  UA negative for UTI.  Ultrasound with some sludge, but no stones or cholecystitis.  CBD normal.  Given history of Hodgkin lymphoma, obtain CT abdomen.  No acute process found.  On my independent interpretation does have significant stool burden on the right ascending colon.  Unclear if symptoms secondary to biliary colic versus constipation.  Discussed outpatient follow-up; patient agreeable to plan.  Stable for discharge. [TY]  1155 This case discussed with surgery PA, will discuss with attending after he gets out of surgery. [TY]  1229 Patient to follow up outpatient per surgery as her pain is controlled and tolerating PO. Patient  feels comfortable with plan. Discussed low fat diet. Stable for discharge.  [TY]    Clinical Course User Index [TY] Coral Spikes, DO                                 Medical Decision Making This is a 33 year old female with no significant past surgical history presenting emergency department with right upper quad abdominal pain.  He does have a history of Hodgkin lymphoma, anxiety.  She is afebrile, nontachycardic, reassuring blood pressure.  Abdominal exam with soft abdomen, but does have positive Murphy sign.  Concern for biliary colic/cholecystitis versus pancreatitis vs pyelonephritis.  Will get labs, ultrasound.  Will treat pain with IV morphine.  See ed course for further MDM/disposition.   Amount and/or Complexity of Data Reviewed External Data Reviewed:     Details: Per chart review has history of Hodgkin lymphoma.  Had prior CT scan done in 12/14/2022, normal liver, gallbladder and pancreas at that time. Labs: ordered. Decision-making details documented in ED Course. Radiology: ordered. Decision-making details documented in ED Course. ECG/medicine tests: ordered. Decision-making details documented in ED Course.  Risk Prescription drug management. Parenteral controlled substances. Decision regarding hospitalization.          Final Clinical Impression(s) / ED Diagnoses Final diagnoses:  Biliary colic    Rx / DC Orders ED Discharge Orders     None         Coral Spikes, DO 10/26/23 1341

## 2023-11-07 NOTE — Pre-Procedure Instructions (Signed)
 Surgical Instructions   Your procedure is scheduled on November 13, 2023. Report to [facility] Main Entrance "A" at 10:20 A.M., then check in with the Admitting office. Any questions or running late day of surgery: call [phone]  Questions prior to your surgery date: call [phone], Monday-Friday, 8am-4pm. If you experience any cold or flu symptoms such as cough, fever, chills, shortness of breath, etc. between now and your scheduled surgery, please notify us at the above number.     Remember:  Do not eat after midnight the night before your surgery   You may drink clear liquids until 9:20 AM the morning of your surgery.   Clear liquids allowed are: Water, Non-Citrus Juices (without pulp), Carbonated Beverages, Clear Tea (no milk, honey, etc.), Black Coffee Only (NO MILK, CREAM OR POWDERED CREAMER of any kind), and Gatorade.    Take these medicines the morning of surgery with A SIP OF WATER: ondansetron (ZOFRAN-ODT) - may take if needed   One week prior to surgery, STOP taking any Aspirin (unless otherwise instructed by your surgeon) Aleve, Naproxen, Ibuprofen, Motrin, Advil, Goody's, BC's, all herbal medications, fish oil, and non-prescription vitamins.                     Do NOT Smoke (Tobacco/Vaping) for 24 hours prior to your procedure.  If you use a CPAP at night, you may bring your mask/headgear for your overnight stay.   You will be asked to remove any contacts, glasses, piercing's, hearing aid's, dentures/partials prior to surgery. Please bring cases for these items if needed.    Patients discharged the day of surgery will not be allowed to drive home, and someone needs to stay with them for 24 hours.  SURGICAL WAITING ROOM VISITATION Patients may have no more than 2 support people in the waiting area - these visitors may rotate.   Pre-op nurse will coordinate an appropriate time for 1 ADULT support person, who may not rotate, to accompany patient in pre-op.  Children  under the age of 80 must have an adult with them who is not the patient and must remain in the main waiting area with an adult.  If the patient needs to stay at the hospital during part of their recovery, the visitor guidelines for inpatient rooms apply.  Please refer to the [facility] website for the visitor guidelines for any additional information.   If you received a COVID test during your pre-op visit  it is requested that you wear a mask when out in public, stay away from anyone that may not be feeling well and notify your surgeon if you develop symptoms. If you have been in contact with anyone that has tested positive in the last 10 days please notify you surgeon.      Pre-operative CHG Bathing Instructions   You can play a key role in reducing the risk of infection after surgery. Your skin needs to be as free of germs as possible. You can reduce the number of germs on your skin by washing with CHG (chlorhexidine gluconate) soap before surgery. CHG is an antiseptic soap that kills germs and continues to kill germs even after washing.   DO NOT use if you have an allergy to chlorhexidine/CHG or antibacterial soaps. If your skin becomes reddened or irritated, stop using the CHG and notify one of our RNs at [phone].              TAKE A SHOWER THE NIGHT BEFORE  SURGERY AND THE DAY OF SURGERY    Please keep in mind the following:  DO NOT shave, including legs and underarms, 48 hours prior to surgery.   You may shave your face before/day of surgery.  Place clean sheets on your bed the night before surgery Use a clean washcloth (not used since being washed) for each shower. DO NOT sleep with pet's night before surgery.  CHG Shower Instructions:  Wash your face and private area with normal soap. If you choose to wash your hair, wash first with your normal shampoo.  After you use shampoo/soap, rinse your hair and body thoroughly to remove shampoo/soap residue.  Turn the water OFF and  apply half the bottle of CHG soap to a CLEAN washcloth.  Apply CHG soap ONLY FROM YOUR NECK DOWN TO YOUR TOES (washing for 3-5 minutes)  DO NOT use CHG soap on face, private areas, open wounds, or sores.  Pay special attention to the area where your surgery is being performed.  If you are having back surgery, having someone wash your back for you may be helpful. Wait 2 minutes after CHG soap is applied, then you may rinse off the CHG soap.  Pat dry with a clean towel  Put on clean pajamas    Additional instructions for the day of surgery: DO NOT APPLY any lotions, deodorants, cologne, or perfumes.   Do not wear jewelry or makeup Do not wear nail polish, gel polish, artificial nails, or any other type of covering on natural nails (fingers and toes) Do not bring valuables to the hospital. [facility] is not responsible for valuables/personal belongings. Put on clean/comfortable clothes.  Please brush your teeth.  Ask your nurse before applying any prescription medications to the skin.

## 2023-11-08 ENCOUNTER — Ambulatory Visit: Payer: Self-pay | Admitting: General Surgery

## 2023-11-08 ENCOUNTER — Other Ambulatory Visit: Payer: Self-pay

## 2023-11-08 ENCOUNTER — Encounter (HOSPITAL_COMMUNITY)
Admission: RE | Admit: 2023-11-08 | Discharge: 2023-11-08 | Disposition: A | Discharge status: Home / Self Care | Source: Ambulatory Visit | Attending: General Surgery | Admitting: General Surgery

## 2023-11-08 ENCOUNTER — Encounter (HOSPITAL_COMMUNITY): Payer: Self-pay

## 2023-11-08 DIAGNOSIS — Z01818 Encounter for other preprocedural examination: Secondary | ICD-10-CM | POA: Insufficient documentation

## 2023-11-08 DIAGNOSIS — K8050 Calculus of bile duct without cholangitis or cholecystitis without obstruction: Secondary | ICD-10-CM

## 2023-11-08 NOTE — Progress Notes (Signed)
 PCP - Dr. Laurann Montana Cardiologist - Denies  PPM/ICD - Denies Device Orders - N/A Rep Notified - N/A  Chest x-ray - 10-17-23 EKG - 10-26-23 Stress Test - Denies ECHO - 06-05-18 Cardiac Cath - Denies  Sleep Study - Denies CPAP - n/a  NON-Diabetic  Last dose of GLP1 agonist-  Denies GLP1 instructions: N/A  Blood Thinner Instructions: Denies Aspirin Instructions: Denies  ERAS Protcol - NPO PRE-SURGERY Ensure or G2- none  COVID TEST- No   Anesthesia review: no  Patient denies shortness of breath, fever, cough and chest pain at PAT appointment. Patient denies any respiratory issues at this time.    All instructions explained to the patient, with a verbal understanding of the material. Patient agrees to go over the instructions while at home for a better understanding. Patient also instructed to self quarantine after being tested for COVID-19. The opportunity to ask questions was provided.

## 2023-11-08 NOTE — Progress Notes (Signed)
 Surgical Instructions     Your procedure is scheduled on November 13, 2023. Report to [facility] Main Entrance "A" at 10:20 A.M., then check in with the Admitting office. Any questions or running late day of surgery: call [phone]   Questions prior to your surgery date: call [phone], Monday-Friday, 8am-4pm. If you experience any cold or flu symptoms such as cough, fever, chills, shortness of breath, etc. between now and your scheduled surgery, please notify us at the above number.            Remember:       Do not eat or drink after midnight the night before your surgery       Take these medicines the morning of surgery with A SIP OF WATER: ondansetron (ZOFRAN-ODT) - may take if needed     One week prior to surgery, STOP taking any Aspirin (unless otherwise instructed by your surgeon) Aleve, Naproxen, Ibuprofen, Motrin, Advil, Goody's, BC's, all herbal medications, fish oil, and non-prescription vitamins.                     Do NOT Smoke (Tobacco/Vaping) for 24 hours prior to your procedure.   If you use a CPAP at night, you may bring your mask/headgear for your overnight stay.   You will be asked to remove any contacts, glasses, piercing's, hearing aid's, dentures/partials prior to surgery. Please bring cases for these items if needed.    Patients discharged the day of surgery will not be allowed to drive home, and someone needs to stay with them for 24 hours.   SURGICAL WAITING ROOM VISITATION Patients may have no more than 2 support people in the waiting area - these visitors may rotate.   Pre-op nurse will coordinate an appropriate time for 1 ADULT support person, who may not rotate, to accompany patient in pre-op.  Children under the age of 4 must have an adult with them who is not the patient and must remain in the main waiting area with an adult.   If the patient needs to stay at the hospital during part of their recovery, the visitor guidelines for inpatient rooms  apply.   Please refer to the [facility] website for the visitor guidelines for any additional information.     If you received a COVID test during your pre-op visit  it is requested that you wear a mask when out in public, stay away from anyone that may not be feeling well and notify your surgeon if you develop symptoms. If you have been in contact with anyone that has tested positive in the last 10 days please notify you surgeon.         Pre-operative CHG Bathing Instructions    You can play a key role in reducing the risk of infection after surgery. Your skin needs to be as free of germs as possible. You can reduce the number of germs on your skin by washing with CHG (chlorhexidine gluconate) soap before surgery. CHG is an antiseptic soap that kills germs and continues to kill germs even after washing.    DO NOT use if you have an allergy to chlorhexidine/CHG or antibacterial soaps. If your skin becomes reddened or irritated, stop using the CHG and notify one of our RNs at [phone].               TAKE A SHOWER THE NIGHT BEFORE SURGERY AND THE DAY OF SURGERY     Please keep in mind  the following:  DO NOT shave, including legs and underarms, 48 hours prior to surgery.   You may shave your face before/day of surgery.  Place clean sheets on your bed the night before surgery Use a clean washcloth (not used since being washed) for each shower. DO NOT sleep with pet's night before surgery.   CHG Shower Instructions:  Wash your face and private area with normal soap. If you choose to wash your hair, wash first with your normal shampoo.  After you use shampoo/soap, rinse your hair and body thoroughly to remove shampoo/soap residue.  Turn the water OFF and apply half the bottle of CHG soap to a CLEAN washcloth.  Apply CHG soap ONLY FROM YOUR NECK DOWN TO YOUR TOES (washing for 3-5 minutes)  DO NOT use CHG soap on face, private areas, open wounds, or sores.  Pay special attention to the  area where your surgery is being performed.  If you are having back surgery, having someone wash your back for you may be helpful. Wait 2 minutes after CHG soap is applied, then you may rinse off the CHG soap.  Pat dry with a clean towel  Put on clean pajamas     Additional instructions for the day of surgery: DO NOT APPLY any lotions, deodorants, cologne, or perfumes.   Do not wear jewelry or makeup Do not wear nail polish, gel polish, artificial nails, or any other type of covering on natural nails (fingers and toes) Do not bring valuables to the hospital. [facility] is not responsible for valuables/personal belongings. Put on clean/comfortable clothes.  Please brush your teeth.  Ask your nurse before applying any prescription medications to the skin

## 2023-11-13 ENCOUNTER — Ambulatory Visit (HOSPITAL_COMMUNITY): Admitting: Anesthesiology

## 2023-11-13 ENCOUNTER — Encounter (HOSPITAL_COMMUNITY)
Admission: RE | Disposition: A | Discharge status: Home / Self Care | Payer: Self-pay | Source: Ambulatory Visit | Attending: General Surgery

## 2023-11-13 ENCOUNTER — Ambulatory Visit (HOSPITAL_COMMUNITY)
Admission: RE | Admit: 2023-11-13 | Discharge: 2023-11-13 | Disposition: A | Discharge status: Home / Self Care | Source: Ambulatory Visit | Attending: General Surgery | Admitting: General Surgery

## 2023-11-13 ENCOUNTER — Ambulatory Visit (HOSPITAL_COMMUNITY)

## 2023-11-13 ENCOUNTER — Encounter (HOSPITAL_COMMUNITY): Payer: Self-pay | Admitting: General Surgery

## 2023-11-13 ENCOUNTER — Other Ambulatory Visit: Payer: Self-pay

## 2023-11-13 DIAGNOSIS — K8050 Calculus of bile duct without cholangitis or cholecystitis without obstruction: Secondary | ICD-10-CM | POA: Diagnosis present

## 2023-11-13 DIAGNOSIS — K8010 Calculus of gallbladder with chronic cholecystitis without obstruction: Secondary | ICD-10-CM | POA: Insufficient documentation

## 2023-11-13 DIAGNOSIS — F32A Depression, unspecified: Secondary | ICD-10-CM | POA: Diagnosis not present

## 2023-11-13 DIAGNOSIS — K828 Other specified diseases of gallbladder: Secondary | ICD-10-CM | POA: Insufficient documentation

## 2023-11-13 DIAGNOSIS — K8020 Calculus of gallbladder without cholecystitis without obstruction: Secondary | ICD-10-CM

## 2023-11-13 DIAGNOSIS — F419 Anxiety disorder, unspecified: Secondary | ICD-10-CM | POA: Insufficient documentation

## 2023-11-13 DIAGNOSIS — Z87891 Personal history of nicotine dependence: Secondary | ICD-10-CM | POA: Diagnosis not present

## 2023-11-13 DIAGNOSIS — Z8571 Personal history of Hodgkin lymphoma: Secondary | ICD-10-CM | POA: Diagnosis not present

## 2023-11-13 HISTORY — PX: CHOLECYSTECTOMY: SHX55

## 2023-11-13 LAB — CBC WITH DIFFERENTIAL/PLATELET
Abs Immature Granulocytes: 0.01 10*3/uL (ref 0.00–0.07)
Basophils Absolute: 0.1 10*3/uL (ref 0.0–0.1)
Basophils Relative: 1 %
Eosinophils Absolute: 0.1 10*3/uL (ref 0.0–0.5)
Eosinophils Relative: 1 %
HCT: 44.3 % (ref 36.0–46.0)
Hemoglobin: 14.5 g/dL (ref 12.0–15.0)
Immature Granulocytes: 0 %
Lymphocytes Relative: 21 %
Lymphs Abs: 1.2 10*3/uL (ref 0.7–4.0)
MCH: 31.4 pg (ref 26.0–34.0)
MCHC: 32.7 g/dL (ref 30.0–36.0)
MCV: 95.9 fL (ref 80.0–100.0)
Monocytes Absolute: 0.5 10*3/uL (ref 0.1–1.0)
Monocytes Relative: 9 %
Neutro Abs: 3.9 10*3/uL (ref 1.7–7.7)
Neutrophils Relative %: 68 %
Platelets: 261 10*3/uL (ref 150–400)
RBC: 4.62 MIL/uL (ref 3.87–5.11)
RDW: 12.4 % (ref 11.5–15.5)
WBC: 5.8 10*3/uL (ref 4.0–10.5)
nRBC: 0 % (ref 0.0–0.2)

## 2023-11-13 LAB — POCT PREGNANCY, URINE: Preg Test, Ur: NEGATIVE

## 2023-11-13 SURGERY — LAPAROSCOPIC CHOLECYSTECTOMY WITH INTRAOPERATIVE CHOLANGIOGRAM
Anesthesia: General | Site: Abdomen

## 2023-11-13 MED ORDER — ACETAMINOPHEN 500 MG PO TABS: 1000.0000 mg | ORAL_TABLET | ORAL | Status: AC

## 2023-11-13 MED ORDER — SUGAMMADEX SODIUM 200 MG/2ML IV SOLN
INTRAVENOUS | Status: DC | PRN
  Administered 2023-11-13 (×2): 100 mg via INTRAVENOUS

## 2023-11-13 MED ORDER — OXYCODONE HCL 5 MG/5ML PO SOLN
ORAL | Status: AC
  Filled 2023-11-13: qty 5

## 2023-11-13 MED ORDER — CEFAZOLIN SODIUM-DEXTROSE 2-4 GM/100ML-% IV SOLN
INTRAVENOUS | Status: AC
  Filled 2023-11-13: qty 100

## 2023-11-13 MED ORDER — ACETAMINOPHEN 500 MG PO TABS
ORAL_TABLET | ORAL | Status: AC
  Administered 2023-11-13: 1000 mg via ORAL
  Filled 2023-11-13: qty 2

## 2023-11-13 MED ORDER — CELECOXIB 200 MG PO CAPS
ORAL_CAPSULE | ORAL | Status: AC
Start: 2023-11-13 — End: 2023-11-13
  Administered 2023-11-13: 200 mg via ORAL
  Filled 2023-11-13: qty 1

## 2023-11-13 MED ORDER — BUPIVACAINE-EPINEPHRINE (PF) 0.25% -1:200000 IJ SOLN
INTRAMUSCULAR | Status: AC
  Filled 2023-11-13: qty 30

## 2023-11-13 MED ORDER — PROPOFOL 10 MG/ML IV BOLUS
INTRAVENOUS | Status: DC | PRN
  Administered 2023-11-13: 50 mg via INTRAVENOUS
  Administered 2023-11-13: 150 mg via INTRAVENOUS

## 2023-11-13 MED ORDER — SODIUM CHLORIDE 0.9 % IV SOLN
INTRAVENOUS | Status: DC | PRN
  Administered 2023-11-13: 40 mL

## 2023-11-13 MED ORDER — 0.9 % SODIUM CHLORIDE (POUR BTL) OPTIME
TOPICAL | Status: DC | PRN
  Administered 2023-11-13: 1000 mL

## 2023-11-13 MED ORDER — CHLORHEXIDINE GLUCONATE CLOTH 2 % EX PADS: 6.0000 | MEDICATED_PAD | Freq: Once | CUTANEOUS | Status: DC

## 2023-11-13 MED ORDER — MIDAZOLAM HCL 2 MG/2ML IJ SOLN
INTRAMUSCULAR | Status: DC | PRN
  Administered 2023-11-13: 2 mg via INTRAVENOUS

## 2023-11-13 MED ORDER — CELECOXIB 200 MG PO CAPS: 200.0000 mg | ORAL_CAPSULE | Freq: Once | ORAL | Status: DC

## 2023-11-13 MED ORDER — OXYCODONE HCL 5 MG PO TABS
5.0000 mg | ORAL_TABLET | ORAL | 0 refills | Status: DC | PRN
Start: 2023-11-13 — End: 2023-12-26

## 2023-11-13 MED ORDER — OXYCODONE HCL 5 MG/5ML PO SOLN
5.0000 mg | Freq: Once | ORAL | Status: AC | PRN
  Administered 2023-11-13: 5 mg via ORAL

## 2023-11-13 MED ORDER — CHLORHEXIDINE GLUCONATE 0.12 % MT SOLN
15.0000 mL | OROMUCOSAL | Status: AC
Start: 2023-11-13 — End: 2023-11-13

## 2023-11-13 MED ORDER — ENOXAPARIN SODIUM 40 MG/0.4ML IJ SOSY: 40.0000 mg | PREFILLED_SYRINGE | Freq: Once | INTRAMUSCULAR | Status: AC

## 2023-11-13 MED ORDER — DIPHENHYDRAMINE HCL 50 MG/ML IJ SOLN
INTRAMUSCULAR | Status: AC
  Filled 2023-11-13: qty 1

## 2023-11-13 MED ORDER — DIPHENHYDRAMINE HCL 50 MG/ML IJ SOLN
INTRAMUSCULAR | Status: DC | PRN
Start: 2023-11-13 — End: 2023-11-13
  Administered 2023-11-13: 12.5 mg via INTRAVENOUS

## 2023-11-13 MED ORDER — FENTANYL CITRATE (PF) 250 MCG/5ML IJ SOLN
INTRAMUSCULAR | Status: DC | PRN
  Administered 2023-11-13: 100 ug via INTRAVENOUS
  Administered 2023-11-13: 50 ug via INTRAVENOUS

## 2023-11-13 MED ORDER — OXYCODONE HCL 5 MG PO TABS: 5.0000 mg | ORAL_TABLET | Freq: Once | ORAL | Status: AC | PRN

## 2023-11-13 MED ORDER — CEFAZOLIN SODIUM-DEXTROSE 2-4 GM/100ML-% IV SOLN
2.0000 g | INTRAVENOUS | Status: AC
Start: 2023-11-13 — End: 2023-11-13
  Administered 2023-11-13: 2 g via INTRAVENOUS

## 2023-11-13 MED ORDER — FENTANYL CITRATE (PF) 100 MCG/2ML IJ SOLN
INTRAMUSCULAR | Status: AC
  Filled 2023-11-13: qty 2

## 2023-11-13 MED ORDER — ONDANSETRON HCL 4 MG/2ML IJ SOLN
INTRAMUSCULAR | Status: DC | PRN
Start: 2023-11-13 — End: 2023-11-13
  Administered 2023-11-13: 4 mg via INTRAVENOUS

## 2023-11-13 MED ORDER — GABAPENTIN 300 MG PO CAPS
ORAL_CAPSULE | ORAL | Status: AC
Start: 2023-11-13 — End: 2023-11-13
  Administered 2023-11-13: 300 mg via ORAL
  Filled 2023-11-13: qty 1

## 2023-11-13 MED ORDER — FENTANYL CITRATE (PF) 100 MCG/2ML IJ SOLN
25.0000 ug | INTRAMUSCULAR | Status: DC | PRN
  Administered 2023-11-13 (×3): 50 ug via INTRAVENOUS

## 2023-11-13 MED ORDER — MIDAZOLAM HCL 2 MG/2ML IJ SOLN
INTRAMUSCULAR | Status: AC
  Filled 2023-11-13: qty 2

## 2023-11-13 MED ORDER — GABAPENTIN 300 MG PO CAPS
300.0000 mg | ORAL_CAPSULE | ORAL | Status: AC
Start: 2023-11-13 — End: 2023-11-13

## 2023-11-13 MED ORDER — FENTANYL CITRATE (PF) 250 MCG/5ML IJ SOLN
INTRAMUSCULAR | Status: AC
  Filled 2023-11-13: qty 5

## 2023-11-13 MED ORDER — ROCURONIUM BROMIDE 10 MG/ML (PF) SYRINGE
PREFILLED_SYRINGE | INTRAVENOUS | Status: DC | PRN
  Administered 2023-11-13: 10 mg via INTRAVENOUS
  Administered 2023-11-13: 50 mg via INTRAVENOUS

## 2023-11-13 MED ORDER — LACTATED RINGERS IV SOLN: INTRAVENOUS | Status: DC

## 2023-11-13 MED ORDER — LIDOCAINE 2% (20 MG/ML) 5 ML SYRINGE
INTRAMUSCULAR | Status: DC | PRN
Start: 2023-11-13 — End: 2023-11-13
  Administered 2023-11-13: 80 mg via INTRAVENOUS

## 2023-11-13 MED ORDER — CHLORHEXIDINE GLUCONATE 0.12 % MT SOLN
OROMUCOSAL | Status: AC
  Administered 2023-11-13: 15 mL via OROMUCOSAL
  Filled 2023-11-13: qty 15

## 2023-11-13 MED ORDER — AMISULPRIDE (ANTIEMETIC) 5 MG/2ML IV SOLN: 10.0000 mg | Freq: Once | INTRAVENOUS | Status: DC | PRN

## 2023-11-13 MED ORDER — DEXAMETHASONE SODIUM PHOSPHATE 10 MG/ML IJ SOLN
INTRAMUSCULAR | Status: DC | PRN
  Administered 2023-11-13: 10 mg via INTRAVENOUS

## 2023-11-13 MED ORDER — FENTANYL CITRATE (PF) 100 MCG/2ML IJ SOLN
INTRAMUSCULAR | Status: DC
Start: 2023-11-13 — End: 2023-11-13
  Filled 2023-11-13: qty 2

## 2023-11-13 MED ORDER — ENOXAPARIN SODIUM 40 MG/0.4ML IJ SOSY
PREFILLED_SYRINGE | INTRAMUSCULAR | Status: AC
  Administered 2023-11-13: 40 mg via SUBCUTANEOUS
  Filled 2023-11-13: qty 0.4

## 2023-11-13 MED ORDER — ACETAMINOPHEN 500 MG PO TABS
1000.0000 mg | ORAL_TABLET | Freq: Once | ORAL | Status: DC
Start: 2023-11-13 — End: 2023-11-13

## 2023-11-13 MED ORDER — CELECOXIB 200 MG PO CAPS
200.0000 mg | ORAL_CAPSULE | ORAL | Status: AC
Start: 2023-11-13 — End: 2023-11-13

## 2023-11-13 MED ORDER — BUPIVACAINE-EPINEPHRINE 0.25% -1:200000 IJ SOLN
INTRAMUSCULAR | Status: DC | PRN
  Administered 2023-11-13: 30 mL

## 2023-11-13 MED ORDER — SODIUM CHLORIDE 0.9 % IR SOLN
Status: DC | PRN
  Administered 2023-11-13: 1

## 2023-11-13 MED ORDER — PHENYLEPHRINE 80 MCG/ML (10ML) SYRINGE FOR IV PUSH (FOR BLOOD PRESSURE SUPPORT)
PREFILLED_SYRINGE | INTRAVENOUS | Status: AC
  Filled 2023-11-13: qty 10

## 2023-11-13 SURGICAL SUPPLY — 39 items
CANISTER SUCT 3000ML PPV (MISCELLANEOUS) ×1 IMPLANT
CHLORAPREP W/TINT 26 (MISCELLANEOUS) ×1 IMPLANT
CLIP LIGATING HEMO O LOK GREEN (MISCELLANEOUS) ×1 IMPLANT
CNTNR URN SCR LID CUP LEK RST (MISCELLANEOUS) ×1 IMPLANT
COVER MAYO STAND STRL (DRAPES) IMPLANT
COVER SURGICAL LIGHT HANDLE (MISCELLANEOUS) ×1 IMPLANT
DERMABOND ADVANCED .7 DNX12 (GAUZE/BANDAGES/DRESSINGS) ×1 IMPLANT
DRAPE C-ARM 42X120 X-RAY (DRAPES) IMPLANT
ELECT REM PT RETURN 9FT ADLT (ELECTROSURGICAL) ×1 IMPLANT
ELECTRODE REM PT RTRN 9FT ADLT (ELECTROSURGICAL) ×1 IMPLANT
GLOVE BIOGEL PI MICRO STRL 6 (GLOVE) ×1 IMPLANT
GLOVE INDICATOR 6.5 STRL GRN (GLOVE) ×1 IMPLANT
GOWN STRL REUS W/ TWL LRG LVL3 (GOWN DISPOSABLE) ×1 IMPLANT
GRASPER SUT TROCAR 14GX15 (MISCELLANEOUS) ×1 IMPLANT
IRRIG SUCT STRYKERFLOW 2 WTIP (MISCELLANEOUS) ×1 IMPLANT
IRRIGATION SUCT STRKRFLW 2 WTP (MISCELLANEOUS) ×1 IMPLANT
KIT BASIN OR (CUSTOM PROCEDURE TRAY) ×1 IMPLANT
KIT TURNOVER KIT B (KITS) ×1 IMPLANT
L-HOOK LAP DISP 36CM (ELECTROSURGICAL) ×1 IMPLANT
LHOOK LAP DISP 36CM (ELECTROSURGICAL) ×1 IMPLANT
NDL INSUFFLATION 14GA 120MM (NEEDLE) ×1 IMPLANT
NEEDLE INSUFFLATION 14GA 120MM (NEEDLE) ×1 IMPLANT
NS IRRIG 1000ML POUR BTL (IV SOLUTION) ×1 IMPLANT
PAD ARMBOARD POSITIONER FOAM (MISCELLANEOUS) ×1 IMPLANT
PENCIL BUTTON HOLSTER BLD 10FT (ELECTRODE) ×1 IMPLANT
SCISSORS LAP 5X35 DISP (ENDOMECHANICALS) ×1 IMPLANT
SET CHOLANGIOGRAPH 5 50 .035 (SET/KITS/TRAYS/PACK) IMPLANT
SET TUBE SMOKE EVAC HIGH FLOW (TUBING) ×1 IMPLANT
SLEEVE Z-THREAD 5X100MM (TROCAR) ×2 IMPLANT
SUT MNCRL AB 4-0 PS2 18 (SUTURE) ×1 IMPLANT
SUT VICRYL 0 UR6 27IN ABS (SUTURE) IMPLANT
SYS BAG RETRIEVAL 10MM (BASKET) ×1 IMPLANT
SYSTEM BAG RETRIEVAL 10MM (BASKET) ×1 IMPLANT
TOWEL GREEN STERILE (TOWEL DISPOSABLE) ×1 IMPLANT
TRAY LAPAROSCOPIC MC (CUSTOM PROCEDURE TRAY) ×1 IMPLANT
TROCAR Z THREAD OPTICAL 12X100 (TROCAR) ×1 IMPLANT
TROCAR Z-THREAD OPTICAL 5X100M (TROCAR) ×1 IMPLANT
WARMER LAPAROSCOPE (MISCELLANEOUS) ×1 IMPLANT
WATER STERILE IRR 1000ML POUR (IV SOLUTION) ×1 IMPLANT

## 2023-11-13 NOTE — Anesthesia Procedure Notes (Signed)
 Procedure Name: Intubation Date/Time: 11/13/2023 12:52 PM  Performed by: Sudie Grumbling, CRNAPre-anesthesia Checklist: Patient identified, Emergency Drugs available, Suction available and Patient being monitored Patient Re-evaluated:Patient Re-evaluated prior to induction Oxygen Delivery Method: Circle system utilized Preoxygenation: Pre-oxygenation with 100% oxygen Induction Type: IV induction Ventilation: Mask ventilation without difficulty Laryngoscope Size: Miller and 2 Grade View: Grade I Tube type: Oral Tube size: 7.0 mm Number of attempts: 1 Airway Equipment and Method: Stylet and Oral airway Placement Confirmation: ETT inserted through vocal cords under direct vision, positive ETCO2 and breath sounds checked- equal and bilateral Secured at: 22 cm Tube secured with: Tape Dental Injury: Teeth and Oropharynx as per pre-operative assessment

## 2023-11-13 NOTE — Anesthesia Preprocedure Evaluation (Signed)
 Anesthesia Evaluation  Patient identified by MRN, date of birth, ID band Patient awake    Reviewed: Allergy & Precautions, NPO status , Patient's Chart, lab work & pertinent test results  Airway Mallampati: II  TM Distance: >3 FB Neck ROM: Full    Dental no notable dental hx.    Pulmonary neg pulmonary ROS, Patient abstained from smoking., former smoker   Pulmonary exam normal        Cardiovascular negative cardio ROS  Rhythm:Regular Rate:Normal     Neuro/Psych   Anxiety Depression    negative neurological ROS     GI/Hepatic Neg liver ROS,,,Biliary colic    Endo/Other  negative endocrine ROS    Renal/GU negative Renal ROS  negative genitourinary   Musculoskeletal negative musculoskeletal ROS (+)    Abdominal Normal abdominal exam  (+)   Peds  Hematology negative hematology ROS (+)   Anesthesia Other Findings   Reproductive/Obstetrics Lab Results      Component                Value               Date                      PREGTESTUR               NEGATIVE            11/13/2023                PREGSERUM                NEGATIVE            10/26/2023                HCG                      <5.0                09/03/2018                                        Anesthesia Physical Anesthesia Plan  ASA: 2  Anesthesia Plan: General   Post-op Pain Management: Celebrex PO (pre-op)*, Tylenol PO (pre-op)* and Gabapentin PO (pre-op)*   Induction: Intravenous  PONV Risk Score and Plan: 3 and Ondansetron, Dexamethasone, Midazolam and Treatment may vary due to age or medical condition  Airway Management Planned: Mask and Oral ETT  Additional Equipment: None  Intra-op Plan:   Post-operative Plan: Extubation in OR  Informed Consent: I have reviewed the patients History and Physical, chart, labs and discussed the procedure including the risks, benefits and alternatives for the proposed  anesthesia with the patient or authorized representative who has indicated his/her understanding and acceptance.     Dental advisory given  Plan Discussed with: CRNA  Anesthesia Plan Comments:        Anesthesia Quick Evaluation

## 2023-11-13 NOTE — H&P (Signed)
 HPI  Adrienne [name] is an 33 y.o. female who was seen in clinic on 10/31/23 for biliary colic.  Patient has had history of biliary colic. Has had a few episodes over the last year or two but increase in frequency of episodes over the past 3 weeks. She was most recently seen in ED on 3/20. Imaging obtained at that time showed gallbladder sludge without signs of cholecystitis. Labs on 3/20 notable for elevated total bilirubin to 1.8, downtrending from previously 2.5 in February. Her CBD on imaging was normal diameter: 1-34mm.  Patient states her episodes of RUQ and epigastric pain seem to be triggered by fatty/fried/greasy foods. Typically resolve after several hours. But she does have nausea, occasional emesis with these episodes.   10 point review of systems is negative except as listed above in HPI.  Objective  Past Medical History: Past Medical History:  Diagnosis Date   Anxiety 2019   Depression 2016   Dyspnea 01/2009   with sternal area discomfort, Pt questions anxiety   Frequent UTI    Hodgkin's lymphoma (HCC) 02/21/2018   Hodgkin's lymphoma (HCC)    Neutropenic fever (HCC) 04/18/2018    Past Surgical History: Past Surgical History:  Procedure Laterality Date   INDUCED ABORTION     LYMPH NODE BIOPSY Right 02/15/2018   Procedure: RIGHT CERVICAL LYMPH NODE EXCISIONAL BIOPSY;  Surgeon: Harriette Bouillon, MD;  Location: [facility];  Service: General;  Laterality: Right;   MOUTH SURGERY     PORTACATH PLACEMENT Left 03/07/2018   Procedure: INSERTION PORT-A-CATH WITH ULTRASOUND;  Surgeon: Harriette Bouillon, MD;  Location: [facility];  Service: General;  Laterality: Left;   WRIST SURGERY      Family History:  Family History  Problem Relation Age of Onset   Cancer Paternal Grandmother        lung   Heart disease Paternal Grandfather        died from heart attack   Alcohol abuse Neg Hx    Arthritis Neg Hx    Asthma Neg Hx    Birth defects Neg Hx     COPD Neg Hx    Depression Neg Hx    Diabetes Neg Hx    Drug abuse Neg Hx    Early death Neg Hx    Hearing loss Neg Hx    Hyperlipidemia Neg Hx    Hypertension Neg Hx    Kidney disease Neg Hx    Learning disabilities Neg Hx    Mental illness Neg Hx    Mental retardation Neg Hx    Miscarriages / Stillbirths Neg Hx    Stroke Neg Hx    Vision loss Neg Hx    Varicose Veins Neg Hx     Social History:  reports that she has quit smoking. She has never used smokeless tobacco. She reports current alcohol use. She reports that she does not use drugs.  Allergies: No Known Allergies  Medications: I have reviewed the patient's current medications.  Labs: Pertinent lab work personally reviewed.  Imaging: Pertinent imaging personally reviewed RUQ [place] 10/26/23: Gallbladder sludge, no pericholecystic fluid, no wall thickening, sonographic Murphy's sign negative. CBD 1-69mm. CT AP 10/26/23: No acute intra-abdominal process.   Physical Exam Last menstrual period 09/18/2023. General: No acute distress, well appearing HEENT: PERRL, hearing grossly normal, mucous membranes moist CV: Regular rate and rhythm Pulm: Normal work of breathing on room air Abd: Soft, nontender, nondistended Extremities: Warm and well perfused Neuro: A&O  x4, no focal neurologic deficits Psych: Appropriate mood and effect     Assessment   Adrienne [name] is an 33 y.o. female who presents today for laparoscopic cholecystectomy with IOC for biliary colic.  Plan  - OR for laparoscopic cholecystectomy with intraoperative cholangiogram. - We discussed the etiology of patient's pain, we discussed treatment options and recommended surgery. We discussed details of surgery including general anesthesia, laparoscopic approach, identification of cystic duct and common bile duct. Ligation of cystic duct and cystic artery. We discussed I would perform IOC given her persistent mild bilirubin elevation. Possible need for open  procedure, and subtotal cholecystectomy. Possible risks of common bile duct injury, injury to surrounding structures, bile leak, bleeding, infection, diarrhea, retained stone and hernia. The patient showed good understanding and all questions were answered    Donata Duff, MD [facility]

## 2023-11-13 NOTE — Transfer of Care (Signed)
 Immediate Anesthesia Transfer of Care Note  Patient: Adrienne [name]  Procedure(s) Performed: LAPAROSCOPIC CHOLECYSTECTOMY WITH INTRAOPERATIVE CHOLANGIOGRAM (Abdomen)  Patient Location: PACU  Anesthesia Type:General  Level of Consciousness: alert  and patient cooperative  Airway & Oxygen Therapy: Patient Spontanous Breathing and Patient connected to nasal cannula oxygen  Post-op Assessment: Report given to RN and Post -op Vital signs reviewed and stable  Post vital signs: Reviewed and stable  Last Vitals:  Vitals Value Taken Time  BP 125/89 11/13/23 1418  Temp    Pulse 78 11/13/23 1418  Resp 0 11/13/23 1419  SpO2 100 % 11/13/23 1418  Vitals shown include unfiled device data.  Last Pain:  Vitals:   11/13/23 1029  TempSrc:   PainSc: 0-No pain      Patients Stated Pain Goal: 0 (11/13/23 1029)  Complications: No notable events documented.

## 2023-11-13 NOTE — Op Note (Signed)
 11/13/2023 12:13 PM  PATIENT: Adrienne [name]  33 y.o. female  Patient Care Team: Laurann Montana, MD as PCP - General (Family Medicine)  PRE-OPERATIVE DIAGNOSIS: cholelithiasis  POST-OPERATIVE DIAGNOSIS: cholelithiasis with chronic cholecystitis  PROCEDURE: laparoscopic cholecystectomy  SURGEON: Donata Duff, MD  ASSISTANT: None  ANESTHESIA: General endotracheal  EBL: 5cc  DRAINS: None  SPECIMEN: Gallbladder  COUNTS: Sponge, needle and instrument counts were reported correct x2 at the conclusion of the operation  DISPOSITION: PACU in satisfactory condition  COMPLICATIONS: None  FINDINGS: Critical view of safety achieved prior to clipping cystic duct or artery. IOC with contrast extravasation and subsequently aborted.  DESCRIPTION:  The patient was identified & brought into the operating room. The patient was then positioned supine on the OR table. SCDs were in place and active during the entire case. She then underwent general endotracheal anesthesia. Pressure points were padded. Hair on the abdomen was clipped by the OR team. The abdomen was prepped and draped in the standard sterile fashion. Antibiotics were administered. A surgical timeout was performed and confirmed our plan.   A periumbilical incision was made. The umbilical stalk was grasped and retracted outwardly. A veress needle was inserted and the abdomen was insufflated to . The veress was then removed and exchanged for a 5mm trocar optiview using a 30 degree scope and the abdomen was entered under direct visualization. Inspection confirmed no evidence of trocar site complications. The patient was then positioned in reverse Trendelenburg with slight left side down. A 12 mm supxiphoid trocar was placed under direct visualization and  two additional 5mm trocars were placed along the right subcostal line - one 5mm port in mid subcostal region, another 5mm port in the right flank near the anterior axillary  line.  The liver and gallbladder were inspected. Gallbladder was chronically inflamed. Extensive thin adhesions from omentum onto gallbladder.. The gallbladder fundus was grasped and elevated cephalad. An additional grasper was then placed on the infundibulum of the gallbladder and the infundibulum was retracted laterally. Electrocautery was used to lyse thin omental adhesions from gallbladder. Then staying high on the gallbladder, the peritoneum on both sides of the gallbladder was opened with hook cautery. Gentle blunt dissection was then employed with a Art gallery manager working down into [employer]. The cystic duct was identified and carefully circumferentially dissected. The cystic artery was also identified and carefully circumferentially dissected. The space between the cystic artery and hepatocystic plate was developed such that a good view of the liver could be seen through a window medial to the cystic artery. The triangle of Calot had been cleared of all fibrofatty tissue. At this point, a critical view of safety was achieved and the only structures visualized was the skeletonized cystic duct laterally, the skeletonized cystic artery and the liver through the window medial to the artery. No posterior cystic artery was noted  At this point attention was turned to performing a cholangiogram. A partial cystic duct-otomy was created. A 75F cholangiocatheter was then introduced through a puncture site at the right subcostal ridge of the abdominal wall and directed it into the cystic duct. This was then secured with a clip. Saline extravasation initially but this resolved with repositioning of clip. A cholangiogram was then attempted using a dilute radio-opaque contrast and continuous fluoroscopy. Contrast pooled in gallbladder fossa consistent with extravasation. Camera reinserted and on further inspection catheter noted to no longer be secured within duct. At this point elected to abort  cholangiogram as felt risk of further duct  manipulation and attempts at catheter/clipping outweighed benefits since duct appeared normal caliber and no stones felt along cystic duct in addition to patient having no symptoms consistent with choledocholithiasis today.   The cystic duct and artery were clipped with 2 hemolock clips on the patient side and 1 clip on the specimen side. Duct clips placed below ductotomy on patient side and above ductotomy on specimen side. The cystic duct and artery were then divided. The gallbladder was then freed from its remaining attachments to the liver using electrocautery and placed into an endocatch bag. The RUQ was gently irrigated with sterile saline. Hemostasis was then verified. The clips were in good position; the gallbladder fossa was dry. The rest of the abdomen was inspected no injury nor bleeding elsewhere was identified.  The endocatch bag containing the gallbladder was then removed from the subxiphoid port site and passed off as specimen. The subxiphoid port fascia was then closed in a figured of eight fashion with 0 vicryl using a suture passer. The RUQ ports were removed under direct visualization and noted to be hemostatic.Marland Kitchen The fascia was palpated and noted to be completely closed. The abdomen was then desufflated and the periumbilical trocar removed. The skin of all incision sites was approximated with 4-0 monocryl subcuticular suture and dermabond applied. The patient was then awakened from anesthesia, extubated, and transferred to a stretcher for transport to PACU in satisfactory condition.  Instrument, sponge, and needle counts were correct at closure and at the conclusion of the case.   Donata Duff, MD [facility]

## 2023-11-14 ENCOUNTER — Encounter (HOSPITAL_COMMUNITY): Payer: Self-pay | Admitting: General Surgery

## 2023-11-14 LAB — SURGICAL PATHOLOGY

## 2023-11-14 NOTE — Anesthesia Postprocedure Evaluation (Signed)
 Anesthesia Post Note  Patient: TWANA WILEMAN  Procedure(s) Performed: LAPAROSCOPIC CHOLECYSTECTOMY WITH INTRAOPERATIVE CHOLANGIOGRAM (Abdomen)     Patient location during evaluation: PACU Anesthesia Type: General Level of consciousness: awake and alert Pain management: pain level controlled Vital Signs Assessment: post-procedure vital signs reviewed and stable Respiratory status: spontaneous breathing, nonlabored ventilation, respiratory function stable and patient connected to nasal cannula oxygen Cardiovascular status: blood pressure returned to baseline and stable Postop Assessment: no apparent nausea or vomiting Anesthetic complications: no  No notable events documented.  Last Vitals:  Vitals:   11/13/23 1445 11/13/23 1500  BP: 112/81 111/84  Pulse: (!) 47 60  Resp: 17 14  Temp:  36.4 C  SpO2: 98% 99%    Last Pain:  Vitals:   11/13/23 1445  TempSrc:   PainSc: 7    Pain Goal: Patients Stated Pain Goal: 0 (11/13/23 1029)                 Jamar Casagrande L Terica Yogi

## 2023-12-25 NOTE — Assessment & Plan Note (Addendum)
 Patient was diagnosed with stage II Hodgkin lymphoma after presentation with lymphadenopathy in the head and neck region Pathology: Classical Hodgkin lymphoma, nodular sclerosing subtype She completed ABVD chemotherapy with complete response, last dose of chemotherapy in January 2020  Her labs are normal and examination is benign She has no signs or symptoms of cancer recurrence The patient is educated to watch out for signs and symptoms of recurrent disease She does not need long-term follow-up.  She is a long-term cancer survivor.  She will reach out to me if she needs to be seen again in the future

## 2023-12-26 ENCOUNTER — Encounter: Payer: Self-pay | Admitting: Hematology and Oncology

## 2023-12-26 ENCOUNTER — Inpatient Hospital Stay (HOSPITAL_BASED_OUTPATIENT_CLINIC_OR_DEPARTMENT_OTHER): Payer: BC Managed Care – PPO | Admitting: Hematology and Oncology

## 2023-12-26 ENCOUNTER — Inpatient Hospital Stay: Payer: BC Managed Care – PPO | Attending: Hematology and Oncology

## 2023-12-26 VITALS — BP 116/69 | HR 67 | Temp 98.8°F | Resp 16 | Ht 69.0 in | Wt 163.7 lb

## 2023-12-26 DIAGNOSIS — C8170 Other classical Hodgkin lymphoma, unspecified site: Secondary | ICD-10-CM | POA: Diagnosis not present

## 2023-12-26 DIAGNOSIS — N921 Excessive and frequent menstruation with irregular cycle: Secondary | ICD-10-CM

## 2023-12-26 DIAGNOSIS — C8111 Nodular sclerosis classical Hodgkin lymphoma, lymph nodes of head, face, and neck: Secondary | ICD-10-CM | POA: Diagnosis present

## 2023-12-26 DIAGNOSIS — Z9049 Acquired absence of other specified parts of digestive tract: Secondary | ICD-10-CM | POA: Insufficient documentation

## 2023-12-26 LAB — CBC WITH DIFFERENTIAL (CANCER CENTER ONLY)
Abs Immature Granulocytes: 0.03 10*3/uL (ref 0.00–0.07)
Basophils Absolute: 0.1 10*3/uL (ref 0.0–0.1)
Basophils Relative: 1 %
Eosinophils Absolute: 0.1 10*3/uL (ref 0.0–0.5)
Eosinophils Relative: 1 %
HCT: 40.4 % (ref 36.0–46.0)
Hemoglobin: 13.8 g/dL (ref 12.0–15.0)
Immature Granulocytes: 0 %
Lymphocytes Relative: 16 %
Lymphs Abs: 1.3 10*3/uL (ref 0.7–4.0)
MCH: 31 pg (ref 26.0–34.0)
MCHC: 34.2 g/dL (ref 30.0–36.0)
MCV: 90.8 fL (ref 80.0–100.0)
Monocytes Absolute: 0.6 10*3/uL (ref 0.1–1.0)
Monocytes Relative: 7 %
Neutro Abs: 6.4 10*3/uL (ref 1.7–7.7)
Neutrophils Relative %: 75 %
Platelet Count: 274 10*3/uL (ref 150–400)
RBC: 4.45 MIL/uL (ref 3.87–5.11)
RDW: 12.4 % (ref 11.5–15.5)
WBC Count: 8.4 10*3/uL (ref 4.0–10.5)
nRBC: 0 % (ref 0.0–0.2)

## 2023-12-26 LAB — CMP (CANCER CENTER ONLY)
ALT: 14 U/L (ref 0–44)
AST: 15 U/L (ref 15–41)
Albumin: 4.4 g/dL (ref 3.5–5.0)
Alkaline Phosphatase: 55 U/L (ref 38–126)
Anion gap: 8 (ref 5–15)
BUN: 14 mg/dL (ref 6–20)
CO2: 25 mmol/L (ref 22–32)
Calcium: 9.2 mg/dL (ref 8.9–10.3)
Chloride: 107 mmol/L (ref 98–111)
Creatinine: 0.84 mg/dL (ref 0.44–1.00)
GFR, Estimated: >60 mL/min (ref >60)
Glucose, Bld: 85 mg/dL (ref 70–99)
Potassium: 4.2 mmol/L (ref 3.5–5.1)
Sodium: 140 mmol/L (ref 135–145)
Total Bilirubin: 1.1 mg/dL (ref 0.0–1.2)
Total Protein: 7.5 g/dL (ref 6.5–8.1)

## 2023-12-26 LAB — SEDIMENTATION RATE: Sed Rate: 2 mm/h (ref 0–22)

## 2023-12-26 NOTE — Progress Notes (Signed)
 [facility] OFFICE PROGRESS NOTE  Patient Care Team: Victorio Grave, MD as PCP - General (Family Medicine)  Assessment & Plan Classical Hodgkin lymphoma [facility] Patient was diagnosed with stage II Hodgkin lymphoma after presentation with lymphadenopathy in the head and neck region Pathology: Classical Hodgkin lymphoma, nodular sclerosing subtype She completed ABVD chemotherapy with complete response, last dose of chemotherapy in January 2020  Her labs are normal and examination is benign She has no signs or symptoms of cancer recurrence The patient is educated to watch out for signs and symptoms of recurrent disease She does not need long-term follow-up.  She is a long-term cancer survivor.  She will reach out to me if she needs to be seen again in the future  No orders of the defined types were placed in this encounter.    Almeda Jacobs, MD  INTERVAL HISTORY: she returns for surveillance follow-up for history of Hodgkin lymphoma Since last time I saw her, she underwent cholecystectomy a month ago and appears to be healing well She denies new lymphadenopathy  PHYSICAL EXAMINATION: ECOG PERFORMANCE STATUS: 0 - Asymptomatic  Vitals:   12/26/23 0759  BP: 116/69  Pulse: 67  Resp: 16  Temp: 98.8 F (37.1 C)  SpO2: 100%   Filed Weights   12/26/23 0759  Weight: 163 lb 11.2 oz (74.3 kg)    Relevant data reviewed during this visit included CBC and CMP
